# Patient Record
Sex: Female | Born: 1975 | Race: Black or African American | Hispanic: No | Marital: Single | Smoking: Never smoker
Health system: Southern US, Community
[De-identification: ages and names within clinical notes are randomized; demographics above are authoritative.]

## PROBLEM LIST (undated history)

## (undated) DIAGNOSIS — D649 Anemia, unspecified: Secondary | ICD-10-CM

## (undated) DIAGNOSIS — G43909 Migraine, unspecified, not intractable, without status migrainosus: Secondary | ICD-10-CM

## (undated) DIAGNOSIS — G44009 Cluster headache syndrome, unspecified, not intractable: Secondary | ICD-10-CM

## (undated) DIAGNOSIS — D219 Benign neoplasm of connective and other soft tissue, unspecified: Secondary | ICD-10-CM

## (undated) DIAGNOSIS — K219 Gastro-esophageal reflux disease without esophagitis: Secondary | ICD-10-CM

## (undated) HISTORY — DX: Migraine, unspecified, not intractable, without status migrainosus: G43.909

## (undated) HISTORY — PX: WISDOM TOOTH EXTRACTION: SHX21

## (undated) HISTORY — DX: Gastro-esophageal reflux disease without esophagitis: K21.9

---

## 2016-07-06 ENCOUNTER — Emergency Department (HOSPITAL_COMMUNITY): Payer: Self-pay

## 2016-07-06 ENCOUNTER — Encounter (HOSPITAL_COMMUNITY): Payer: Self-pay | Admitting: *Deleted

## 2016-07-06 ENCOUNTER — Emergency Department (HOSPITAL_COMMUNITY)
Admission: EM | Admit: 2016-07-06 | Discharge: 2016-07-06 | Disposition: A | Discharge status: Home / Self Care | Payer: Self-pay | Attending: Physician Assistant | Admitting: Physician Assistant

## 2016-07-06 DIAGNOSIS — K2100 Gastro-esophageal reflux disease with esophagitis, without bleeding: Secondary | ICD-10-CM

## 2016-07-06 DIAGNOSIS — R071 Chest pain on breathing: Secondary | ICD-10-CM | POA: Insufficient documentation

## 2016-07-06 LAB — D-DIMER, QUANTITATIVE (NOT AT ARMC): D DIMER QUANT: 0.65 ug{FEU}/mL — AB (ref 0.00–0.50)

## 2016-07-06 LAB — CBC
HCT: 36.8 % (ref 36.0–46.0)
Hemoglobin: 12 g/dL (ref 12.0–15.0)
MCH: 25.3 pg — ABNORMAL LOW (ref 26.0–34.0)
MCHC: 32.6 g/dL (ref 30.0–36.0)
MCV: 77.5 fL — ABNORMAL LOW (ref 78.0–100.0)
Platelets: 342 10*3/uL (ref 150–400)
RBC: 4.75 MIL/uL (ref 3.87–5.11)
RDW: 14.8 % (ref 11.5–15.5)
WBC: 9.5 10*3/uL (ref 4.0–10.5)

## 2016-07-06 LAB — BASIC METABOLIC PANEL
Anion gap: 8 (ref 5–15)
BUN: 13 mg/dL (ref 6–20)
CALCIUM: 9.1 mg/dL (ref 8.9–10.3)
CO2: 23 mmol/L (ref 22–32)
CREATININE: 0.71 mg/dL (ref 0.44–1.00)
Chloride: 106 mmol/L (ref 101–111)
GFR calc Af Amer: >60 mL/min (ref >60)
GLUCOSE: 82 mg/dL (ref 65–99)
Potassium: 3.9 mmol/L (ref 3.5–5.1)
Sodium: 137 mmol/L (ref 135–145)

## 2016-07-06 LAB — TROPONIN I

## 2016-07-06 LAB — PREGNANCY, URINE: Preg Test, Ur: NEGATIVE

## 2016-07-06 MED ORDER — SUCRALFATE 1 G PO TABS: 1.0000 g | ORAL_TABLET | Freq: Three times a day (TID) | ORAL | 0 refills | Status: DC

## 2016-07-06 MED ORDER — GI COCKTAIL ~~LOC~~
30.0000 mL | Freq: Once | ORAL | Status: AC
  Administered 2016-07-06: 30 mL via ORAL
  Filled 2016-07-06: qty 30

## 2016-07-06 MED ORDER — IOPAMIDOL (ISOVUE-370) INJECTION 76%
INTRAVENOUS | Status: AC
  Administered 2016-07-06: 100 mL
  Filled 2016-07-06: qty 100

## 2016-07-06 MED ORDER — OMEPRAZOLE 20 MG PO CPDR
20.0000 mg | DELAYED_RELEASE_CAPSULE | Freq: Every day | ORAL | 0 refills | Status: DC
Start: 2016-07-06 — End: 2018-04-17

## 2016-07-06 NOTE — ED Triage Notes (Signed)
The pt c/o lt chest pain for 3 days  Hx of acid reflux  lmp   1st april

## 2016-07-06 NOTE — ED Notes (Signed)
Pt c/o of heartburn

## 2016-07-06 NOTE — ED Notes (Signed)
Pt verbalized understanding discharge instructions and denies any further needs or questions at this time. VS stable, ambulatory and steady gait.   

## 2016-07-06 NOTE — ED Provider Notes (Signed)
[facility] DEPT Provider Note   CSN: [id number] Arrival date & time: 07/06/16  1611     History   Chief Complaint Chief Complaint  Patient presents with  . Chest Pain    HPI Shari Berger is a 41 y.o. female.  HPI   Patient is a 41 year old female presenting with pleuritic chest pain. She reports that she started having pain when she takes breaths for the last three days.  Patient reports never having this happen before. She does report very bad reflux been worsening over the last several weeks. She reports she wakes at 3 AM with feelings of reflux, going up into her back of her throat.  No estrogens, no recent travel, no leg swelling. Pt is tachycardic on arrival. Not exertioanl, no HTN, HLD.      History reviewed. No pertinent past medical history.  There are no active problems to display for this patient.   No past surgical history on file.  OB History    No data available       Home Medications    Prior to Admission medications   Medication Sig Start Date End Date Taking? Authorizing Provider  ranitidine (ZANTAC) 75 MG tablet Take 75 mg by mouth 2 (two) times daily as needed for heartburn.   Yes Historical Provider, MD  omeprazole (PRILOSEC) 20 MG capsule Take 1 capsule (20 mg total) by mouth daily. 07/06/16   Westlynn Fifer Lyn Jasa Dundon, MD  sucralfate (CARAFATE) 1 g tablet Take 1 tablet (1 g total) by mouth 4 (four) times daily -  with meals and at bedtime. 07/06/16   Elnor Renovato Lyn Myrl Bynum, MD    Family History No family history on file.  Social History Social History  Substance Use Topics  . Smoking status: Never Smoker  . Smokeless tobacco: Never Used  . Alcohol use No     Allergies   Patient has no known allergies.   Review of Systems Review of Systems  Constitutional: Negative for activity change and fatigue.  HENT: Negative for congestion.   Eyes: Negative for discharge.  Respiratory: Negative for cough, chest tightness and shortness of  breath.   Cardiovascular: Positive for chest pain. Negative for palpitations.  Gastrointestinal: Negative for abdominal distention.  Genitourinary: Negative for difficulty urinating and dysuria.  Musculoskeletal: Negative for joint swelling.  Skin: Negative for rash.  Allergic/Immunologic: Negative for immunocompromised state.  Neurological: Negative for seizures and speech difficulty.  Psychiatric/Behavioral: Negative for agitation and behavioral problems.  All other systems reviewed and are negative.    Physical Exam Updated Vital Signs BP 116/66   Pulse 92   Temp 98.4 F (36.9 C)   Resp 20   Ht 5\' 4"  (1.626 m)   Wt 225 lb (102.1 kg)   LMP 06/26/2016   SpO2 100%   BMI 38.62 kg/m   Physical Exam  Constitutional: She is oriented to person, place, and time. She appears well-developed and well-nourished.  HENT:  Head: Normocephalic and atraumatic.  Eyes: Right eye exhibits no discharge.  Cardiovascular: Normal rate and regular rhythm.   No murmur heard. Pulmonary/Chest: Effort normal. No respiratory distress. She has no wheezes.  Abdominal: Soft. She exhibits no distension.  Musculoskeletal: She exhibits no edema.  Neurological: She is oriented to person, place, and time.  Skin: Skin is warm and dry. She is not diaphoretic.  Psychiatric: She has a normal mood and affect.  Nursing note and vitals reviewed.    ED Treatments / Results  Labs (all labs ordered are  listed, but only abnormal results are displayed) Labs Reviewed  CBC - Abnormal; Notable for the following:       Result Value   MCV 77.5 (*)    MCH 25.3 (*)    All other components within normal limits  D-DIMER, QUANTITATIVE (NOT AT [facility]) - Abnormal; Notable for the following:    D-Dimer, Quant 0.65 (*)    All other components within normal limits  BASIC METABOLIC PANEL  TROPONIN I  PREGNANCY, URINE    EKG  EKG Interpretation  Date/Time:  Wednesday July 06 2016 16:16:55 EDT Ventricular Rate:   95 PR Interval:  142 QRS Duration: 76 QT Interval:  330 QTC Calculation: 414 R Axis:   92 Text Interpretation:  Normal sinus rhythm Normal sinus rhythm Confirmed by Gerald Leitz ([id number]) on 07/06/2016 6:33:51 PM       Radiology Dg Chest 2 View  Result Date: 07/06/2016 CLINICAL DATA:  Mid upper chest pain for 3 days, worse with inspiration EXAM: CHEST  2 VIEW COMPARISON:  None. FINDINGS: No active infiltrate or effusion is seen. Mediastinal and hilar contours are unremarkable. The heart is within normal limits in size. No abnormality is seen. IMPRESSION: No active cardiopulmonary disease. Electronically Signed   By: Ivar Drape M.D.   On: 07/06/2016 17:04   Ct Angio Chest Pe W And/or Wo Contrast  Result Date: 07/06/2016 CLINICAL DATA:  Tachycardia and pleuritic chest pain EXAM: CT ANGIOGRAPHY CHEST WITH CONTRAST TECHNIQUE: Multidetector CT imaging of the chest was performed using the standard protocol during bolus administration of intravenous contrast. Multiplanar CT image reconstructions and MIPs were obtained to evaluate the vascular anatomy. CONTRAST:  63 cc Isovue 370 IV COMPARISON:  CXR 07/06/2016 FINDINGS: Cardiovascular: The study is of quality for the evaluation of pulmonary embolism. There are no filling defects in the central, lobar, segmental or subsegmental pulmonary artery branches to suggest acute pulmonary embolism. Great vessels are normal in course and caliber. Normal heart size. No significant pericardial fluid/thickening. No aortic aneurysm or dissection. Mediastinum/Nodes: No discrete thyroid nodules. Unremarkable esophagus. No pathologically enlarged axillary, mediastinal or hilar lymph nodes. Lungs/Pleura: No pneumothorax. No pleural effusion. Clear lungs bilaterally. Upper abdomen: Small to moderate-sized hiatal hernia. Musculoskeletal:  No aggressive appearing focal osseous lesions. Review of the MIP images confirms the above findings. IMPRESSION: 1. No acute pulmonary  embolus, aortic aneurysm or dissection. 2. Clear lungs. 3. Small to moderate-sized hiatal hernia. Electronically Signed   By: Ashley Royalty M.D.   On: 07/06/2016 22:27    Procedures Procedures (including critical care time)  Medications Ordered in ED Medications  gi cocktail (Maalox,Lidocaine,Donnatal) (30 mLs Oral Given 07/06/16 1849)  iopamidol (ISOVUE-370) 76 % injection (100 mLs  Contrast Given 07/06/16 2214)     Initial Impression / Assessment and Plan / ED Course  I have reviewed the triage vital signs and the nursing notes.  Pertinent labs & imaging results that were available during my care of the patient were reviewed by me and considered in my medical decision making (see chart for details).     Patient is well-appearing 42 year old female with pleuritic chest pain. Do not suspect ACS, no receptors EKG normal and chest pain is only with deep breaths. Given her tachycardia will need to do d-dimer. Patient does not perk out. Patient does have symptoms of reflux. We'll treat her with omeprazole daily and follow up with GI.  11:36 PM D-dimer positive, urine pregnancy completed and then CT angios completed. CT negative. We'll discharge home with  medications as planned.   Final Clinical Impressions(s) / ED Diagnoses   Final diagnoses:  Chest pain on breathing  Gastroesophageal reflux disease with esophagitis    New Prescriptions Discharge Medication List as of 07/06/2016 10:57 PM    START taking these medications   Details  omeprazole (PRILOSEC) 20 MG capsule Take 1 capsule (20 mg total) by mouth daily., Starting Wed 07/06/2016, Print    sucralfate (CARAFATE) 1 g tablet Take 1 tablet (1 g total) by mouth 4 (four) times daily -  with meals and at bedtime., Starting Wed 07/06/2016, Print         Alyce Inscore Julio Alm, MD 07/06/16 [phone]

## 2016-07-06 NOTE — Discharge Instructions (Signed)
We're happy to report that your chest pain is not due to pulmonary embolism and we do not think it's due to heart ischemia. It is possible that this is a result of gastroesophageal reflux. Because of this we will need to start you on a medication that you will take every day. We provided this prescription. This will help reduce the acid in your stomach. In addition to decrease the symptoms, it will promote healing of any ulcers that could be in your stomach creatiing more pain. Additionally we given you medication to help with your symptoms. Please take this as needed. We will you this o take this for 2 weeks and monitor symptoms. If not better, we want you follow-up with a gastroenterologist. The referral has been provided.

## 2016-07-06 NOTE — ED Notes (Signed)
Attempted labs x1

## 2016-07-06 NOTE — ED Notes (Signed)
Pt unable to sign b/c signature pad is not working.

## 2016-07-06 NOTE — ED Triage Notes (Signed)
The pain is worse with inspiration

## 2016-08-05 ENCOUNTER — Encounter (HOSPITAL_COMMUNITY): Payer: Self-pay | Admitting: Emergency Medicine

## 2016-08-05 ENCOUNTER — Emergency Department (HOSPITAL_COMMUNITY)
Admission: EM | Admit: 2016-08-05 | Discharge: 2016-08-05 | Disposition: A | Discharge status: Home / Self Care | Payer: Self-pay | Attending: Emergency Medicine | Admitting: Emergency Medicine

## 2016-08-05 DIAGNOSIS — Z79899 Other long term (current) drug therapy: Secondary | ICD-10-CM | POA: Insufficient documentation

## 2016-08-05 DIAGNOSIS — R1110 Vomiting, unspecified: Secondary | ICD-10-CM | POA: Insufficient documentation

## 2016-08-05 DIAGNOSIS — Z5321 Procedure and treatment not carried out due to patient leaving prior to being seen by health care provider: Secondary | ICD-10-CM | POA: Insufficient documentation

## 2016-08-05 LAB — COMPREHENSIVE METABOLIC PANEL
ALT: 16 U/L (ref 14–54)
ANION GAP: 6 (ref 5–15)
AST: 11 U/L — ABNORMAL LOW (ref 15–41)
Albumin: 3.2 g/dL — ABNORMAL LOW (ref 3.5–5.0)
Alkaline Phosphatase: 59 U/L (ref 38–126)
BILIRUBIN TOTAL: 0.4 mg/dL (ref 0.3–1.2)
BUN: 15 mg/dL (ref 6–20)
CHLORIDE: 110 mmol/L (ref 101–111)
CO2: 19 mmol/L — ABNORMAL LOW (ref 22–32)
Calcium: 8.3 mg/dL — ABNORMAL LOW (ref 8.9–10.3)
Creatinine, Ser: 0.84 mg/dL (ref 0.44–1.00)
Glucose, Bld: 106 mg/dL — ABNORMAL HIGH (ref 65–99)
POTASSIUM: 3.7 mmol/L (ref 3.5–5.1)
Sodium: 135 mmol/L (ref 135–145)
TOTAL PROTEIN: 6.8 g/dL (ref 6.5–8.1)

## 2016-08-05 LAB — CBC
HEMATOCRIT: 35.3 % — AB (ref 36.0–46.0)
Hemoglobin: 11.5 g/dL — ABNORMAL LOW (ref 12.0–15.0)
MCH: 25.2 pg — ABNORMAL LOW (ref 26.0–34.0)
MCHC: 32.6 g/dL (ref 30.0–36.0)
MCV: 77.4 fL — AB (ref 78.0–100.0)
Platelets: 301 10*3/uL (ref 150–400)
RBC: 4.56 MIL/uL (ref 3.87–5.11)
RDW: 15.2 % (ref 11.5–15.5)
WBC: 8.1 10*3/uL (ref 4.0–10.5)

## 2016-08-05 LAB — LIPASE, BLOOD: Lipase: 21 U/L (ref 11–51)

## 2016-08-05 NOTE — ED Notes (Signed)
Pt states, "I have to leave and get my kids. It has nothing to do with the wait." pt encouraged to stay for eval

## 2016-08-05 NOTE — ED Triage Notes (Signed)
Pt reports vomiting for the past 3 days as well as some burning in her throat that she thinks is acid reflux. Reports diarrhea 2 days ago but none since. Denies pain at this time.

## 2017-01-30 ENCOUNTER — Emergency Department (HOSPITAL_COMMUNITY): Payer: Self-pay

## 2017-01-30 ENCOUNTER — Encounter (HOSPITAL_COMMUNITY): Payer: Self-pay

## 2017-01-30 DIAGNOSIS — Z5321 Procedure and treatment not carried out due to patient leaving prior to being seen by health care provider: Secondary | ICD-10-CM | POA: Insufficient documentation

## 2017-01-30 LAB — CBC
HEMATOCRIT: 35.5 % — AB (ref 36.0–46.0)
Hemoglobin: 11.7 g/dL — ABNORMAL LOW (ref 12.0–15.0)
MCH: 25.9 pg — ABNORMAL LOW (ref 26.0–34.0)
MCHC: 33 g/dL (ref 30.0–36.0)
MCV: 78.5 fL (ref 78.0–100.0)
PLATELETS: 333 10*3/uL (ref 150–400)
RBC: 4.52 MIL/uL (ref 3.87–5.11)
RDW: 15.5 % (ref 11.5–15.5)
WBC: 11.5 10*3/uL — AB (ref 4.0–10.5)

## 2017-01-30 LAB — BASIC METABOLIC PANEL
Anion gap: 8 (ref 5–15)
BUN: 16 mg/dL (ref 6–20)
CALCIUM: 8.6 mg/dL — AB (ref 8.9–10.3)
CO2: 22 mmol/L (ref 22–32)
CREATININE: 0.82 mg/dL (ref 0.44–1.00)
Chloride: 105 mmol/L (ref 101–111)
GFR calc Af Amer: >60 mL/min (ref >60)
GLUCOSE: 96 mg/dL (ref 65–99)
Potassium: 3.8 mmol/L (ref 3.5–5.1)
SODIUM: 135 mmol/L (ref 135–145)

## 2017-01-30 LAB — I-STAT TROPONIN, ED: Troponin i, poc: 0 ng/mL (ref 0.00–0.08)

## 2017-01-30 NOTE — ED Triage Notes (Signed)
Pt states that she started to have central CP today after getting some bad news with no other cardiac symptoms noted. Pt also c/o of L knee pain, denies falling or injury.

## 2017-01-31 ENCOUNTER — Emergency Department (HOSPITAL_COMMUNITY)
Admission: EM | Admit: 2017-01-31 | Discharge: 2017-01-31 | Disposition: A | Discharge status: Home / Self Care | Payer: Self-pay | Attending: Emergency Medicine | Admitting: Emergency Medicine

## 2017-01-31 NOTE — ED Notes (Signed)
Called in lobby x 2 no answer.

## 2017-04-11 ENCOUNTER — Encounter: Payer: Self-pay | Admitting: Family Medicine

## 2017-04-11 ENCOUNTER — Ambulatory Visit (INDEPENDENT_AMBULATORY_CARE_PROVIDER_SITE_OTHER): Payer: Self-pay | Admitting: Family Medicine

## 2017-04-11 ENCOUNTER — Other Ambulatory Visit: Payer: Self-pay

## 2017-04-11 VITALS — BP 132/78 | HR 110 | Temp 98.6°F | Ht 65.0 in | Wt 238.0 lb

## 2017-04-11 DIAGNOSIS — G43909 Migraine, unspecified, not intractable, without status migrainosus: Secondary | ICD-10-CM | POA: Insufficient documentation

## 2017-04-11 DIAGNOSIS — R102 Pelvic and perineal pain: Secondary | ICD-10-CM

## 2017-04-11 DIAGNOSIS — K219 Gastro-esophageal reflux disease without esophagitis: Secondary | ICD-10-CM | POA: Insufficient documentation

## 2017-04-11 DIAGNOSIS — Z3169 Encounter for other general counseling and advice on procreation: Secondary | ICD-10-CM

## 2017-04-11 DIAGNOSIS — Z0000 Encounter for general adult medical examination without abnormal findings: Secondary | ICD-10-CM

## 2017-04-11 DIAGNOSIS — M7750 Other enthesopathy of unspecified foot: Secondary | ICD-10-CM

## 2017-04-11 NOTE — Progress Notes (Signed)
ROI for [facility] in [place] was completed and faxed today. Jhan Conery, Salome Spotted, CMA

## 2017-04-11 NOTE — Patient Instructions (Signed)
It was nice meeting you today Ms. White!  If you have any questions or concerns, please feel free to call the clinic.   Be well,  Dr. Shan Levans

## 2017-04-11 NOTE — Progress Notes (Signed)
Subjective:    Shari Berger - 42 y.o. female MRN [id number]  Date of birth: [birth year]  HPI  Shari Berger is here for her new patient visit.  Concerns today include: preconception counseling, pelvic pain, and foot pain.  Preconception counseling: Patient and her partner have been trying to conceive for about 4 months now.  She noticed that her period occurred earlier than normal in December and was much heavier than normal at that time.  She has not taken any pregnancy tests since she has not missed a period.  She has a 60 year old son and her partner has five other children.  A few years ago, she received Clomid to help her conceive, which resulted in pregnancy, but she miscarried.  She denies history of polycystic ovary disease.  Pelvic pain: Patient gets bilateral pelvic pain intermittently.  It is worsened during sex, but does not always occur during sex.  She says that the pain often occurs shortly after her period stops.  This has been going on for the past month.  Foot pain: Patient has had slowly worsening pain on the distal area of the dorsal surface of her right foot.  It started a few months ago and is worst when she wakes up and goes from sitting to standing.  She says she often has to limp for the first few steps before she can walk without pain.  Pain is exacerbated by dorsiflexion and rotating her ankle.  She stands all day during her work at [employer], so she thinks this is the source of her pain.     Health Maintenance:  - last pap smear was in 2016; we will obtain results from previous physician Health Maintenance Due  Topic Date Due  . HIV Screening  01/25/1992  . TETANUS/TDAP  01/25/1996  . PAP SMEAR  01/24/1998    -  reports that  has never smoked. she has never used smokeless tobacco. - Review of Systems: Per HPI. - Past Medical History: Patient Active Problem List   Diagnosis Date Noted  . Tendonitis of foot 04/12/2017  . Encounter for preconception  consultation 04/12/2017  . Pelvic pain 04/12/2017  . Health care maintenance 04/12/2017  . Migraines 04/11/2017  . Acid reflux 04/11/2017   - Medications: reviewed and updated   Objective:   Physical Exam BP 132/78   Pulse (!) 110   Temp 98.6 F (37 C) (Oral)   Ht 5\' 5"  (1.651 m)   Wt 238 lb (108 kg)   LMP 02/27/2017   SpO2 98%   BMI 39.61 kg/m  Gen: NAD, alert, cooperative with exam, well-appearing HEENT: NCAT, clear conjunctiva, supple neck CV: RRR, good S1/S2, no murmur, no edema Resp: CTABL, no wheezes, non-labored Abd: SNTND, BS present, no guarding or organomegaly Musculoskeletal: tenderness to palpation of right metatarsal tendons, especially with dorsiflexion Skin: no rashes, normal turgor  Neuro: no gross deficits.  Psych: good insight, alert and oriented        Assessment & Plan:   Encounter for preconception consultation Patient was told that she may have had a miscarriage in December since she is describing heavy bleeding and an irregular period that month, although this is not a certainty.  Patient counseled on cessation of alcohol consumption and starting prenatal vitamins since she is attempting to conceive.  She was told that she is most fertile around day 14 of her menstrual cycle, so she should have unprotected sexual intercourse most frequently around this date.  She  was also encouraged to get exercise and eat lots of fruits and vegetables since weight loss will aid in conception.  She should try for a few more months, and if she has no success, I will be happy to refer her to a specialist for further management.  Pelvic pain The pain she is describing sounds most like Mittleschmertz, since it seems to occur around ovulation.  She was encouraged to mark the days that she feels this pain to see if there is a pattern.  If her pain worsens, we will initiate further workup.  Tendonitis of foot Patient's soreness in the region of her right metatarsal tendons  as well as her job that requires frequent standing makes tendonitis the most likely diagnosis.  Her current shoes that she wears for work have worked well for her, but they are very old and likely not supportive.  She was encouraged to rest her feet as much as possible and to buy new shoes that will be more supportive for her.  I will refer her to sports medicine in the future if these interventions are not effective.  Health care maintenance Patient may be due for a pap smear if she did not get HPV cotesting in 2016 or if that pap was abnormal.  We will obtain patient's records and decide the course from there.    Maia Breslow, M.D. 04/12/2017, 2:10 PM PGY-1, [facility]

## 2017-04-12 DIAGNOSIS — M7750 Other enthesopathy of unspecified foot: Secondary | ICD-10-CM | POA: Insufficient documentation

## 2017-04-12 DIAGNOSIS — R102 Pelvic and perineal pain: Secondary | ICD-10-CM | POA: Insufficient documentation

## 2017-04-12 DIAGNOSIS — Z0000 Encounter for general adult medical examination without abnormal findings: Secondary | ICD-10-CM | POA: Insufficient documentation

## 2017-04-12 DIAGNOSIS — Z3169 Encounter for other general counseling and advice on procreation: Secondary | ICD-10-CM | POA: Insufficient documentation

## 2017-04-12 NOTE — Assessment & Plan Note (Signed)
Patient's soreness in the region of her right metatarsal tendons as well as her job that requires frequent standing makes tendonitis the most likely diagnosis.  Her current shoes that she wears for work have worked well for her, but they are very old and likely not supportive.  She was encouraged to rest her feet as much as possible and to buy new shoes that will be more supportive for her.  I will refer her to sports medicine in the future if these interventions are not effective.

## 2017-04-12 NOTE — Assessment & Plan Note (Addendum)
Patient was told that she may have had a miscarriage in December since she is describing heavy bleeding and an irregular period that month, although this is not a certainty.  Patient counseled on cessation of alcohol consumption and starting prenatal vitamins since she is attempting to conceive.  She was told that she is most fertile around day 14 of her menstrual cycle, so she should have unprotected sexual intercourse most frequently around this date.  She was also encouraged to get exercise and eat lots of fruits and vegetables since weight loss will aid in conception.  She should try for a few more months, and if she has no success, I will be happy to refer her to a specialist for further management.

## 2017-04-12 NOTE — Assessment & Plan Note (Signed)
Patient may be due for a pap smear if she did not get HPV cotesting in 2016 or if that pap was abnormal.  We will obtain patient's records and decide the course from there.

## 2017-04-12 NOTE — Assessment & Plan Note (Signed)
The pain she is describing sounds most like [place]chmertz, since it seems to occur around ovulation.  She was encouraged to mark the days that she feels this pain to see if there is a pattern.  If her pain worsens, we will initiate further workup.

## 2017-05-21 ENCOUNTER — Encounter: Payer: Self-pay | Admitting: Family Medicine

## 2017-05-21 ENCOUNTER — Other Ambulatory Visit: Payer: Self-pay | Admitting: Family Medicine

## 2017-05-21 NOTE — Telephone Encounter (Signed)
Patient's last pap smear with reflex HPV was December 2015.  Pap normal, HPV negative.  Due for next pap with HPV testing in December 2020.

## 2017-06-29 ENCOUNTER — Ambulatory Visit: Payer: Self-pay | Admitting: Family Medicine

## 2017-09-07 ENCOUNTER — Other Ambulatory Visit: Payer: Self-pay

## 2017-09-07 ENCOUNTER — Emergency Department (HOSPITAL_COMMUNITY): Payer: Self-pay

## 2017-09-07 ENCOUNTER — Emergency Department (HOSPITAL_COMMUNITY)
Admission: EM | Admit: 2017-09-07 | Discharge: 2017-09-07 | Disposition: A | Discharge status: Home / Self Care | Payer: Self-pay | Attending: Emergency Medicine | Admitting: Emergency Medicine

## 2017-09-07 ENCOUNTER — Encounter (HOSPITAL_COMMUNITY): Payer: Self-pay | Admitting: Emergency Medicine

## 2017-09-07 DIAGNOSIS — R079 Chest pain, unspecified: Secondary | ICD-10-CM | POA: Insufficient documentation

## 2017-09-07 DIAGNOSIS — Z5321 Procedure and treatment not carried out due to patient leaving prior to being seen by health care provider: Secondary | ICD-10-CM | POA: Insufficient documentation

## 2017-09-07 LAB — BASIC METABOLIC PANEL
Anion gap: 7 (ref 5–15)
BUN: 15 mg/dL (ref 6–20)
CALCIUM: 8.4 mg/dL — AB (ref 8.9–10.3)
CO2: 22 mmol/L (ref 22–32)
CREATININE: 0.66 mg/dL (ref 0.44–1.00)
Chloride: 110 mmol/L (ref 101–111)
GFR calc Af Amer: >60 mL/min (ref >60)
Glucose, Bld: 90 mg/dL (ref 65–99)
POTASSIUM: 4 mmol/L (ref 3.5–5.1)
SODIUM: 139 mmol/L (ref 135–145)

## 2017-09-07 LAB — I-STAT TROPONIN, ED
TROPONIN I, POC: 0 ng/mL (ref 0.00–0.08)
Troponin i, poc: 0.02 ng/mL (ref 0.00–0.08)

## 2017-09-07 LAB — CBC
HCT: 36.6 % (ref 36.0–46.0)
Hemoglobin: 11 g/dL — ABNORMAL LOW (ref 12.0–15.0)
MCH: 23.4 pg — ABNORMAL LOW (ref 26.0–34.0)
MCHC: 30.1 g/dL (ref 30.0–36.0)
MCV: 77.9 fL — ABNORMAL LOW (ref 78.0–100.0)
Platelets: 321 10*3/uL (ref 150–400)
RBC: 4.7 MIL/uL (ref 3.87–5.11)
RDW: 15.9 % — AB (ref 11.5–15.5)
WBC: 8.5 10*3/uL (ref 4.0–10.5)

## 2017-09-07 LAB — I-STAT BETA HCG BLOOD, ED (MC, WL, AP ONLY)

## 2017-09-07 NOTE — ED Notes (Signed)
Patient called x 3 for room with no response.  All areas of lobby and outside checked.  Patient not found.

## 2017-09-07 NOTE — ED Triage Notes (Signed)
Pt reports left sided chest pain x3 days, no sob, nausea or radiation of pain. Also endorses right foot pain x1 month, denies any known injuries.

## 2017-09-07 NOTE — ED Notes (Signed)
Spoke with a provider regarding patient and additional orders. Notified patient verbalized understanding.

## 2017-09-11 NOTE — ED Notes (Addendum)
Follow up call made  No answer 09/11/17  1434  s Bekim Werntz rn     1442  Pt returned follow up call  s Enrika Aguado rn

## 2017-11-27 ENCOUNTER — Other Ambulatory Visit: Payer: Self-pay

## 2017-11-27 ENCOUNTER — Encounter (HOSPITAL_COMMUNITY): Payer: Self-pay | Admitting: Emergency Medicine

## 2017-11-27 ENCOUNTER — Emergency Department (HOSPITAL_COMMUNITY)
Admission: EM | Admit: 2017-11-27 | Discharge: 2017-11-27 | Disposition: A | Discharge status: Home / Self Care | Payer: Self-pay | Attending: Emergency Medicine | Admitting: Emergency Medicine

## 2017-11-27 ENCOUNTER — Emergency Department (HOSPITAL_COMMUNITY): Payer: Self-pay

## 2017-11-27 DIAGNOSIS — Z79899 Other long term (current) drug therapy: Secondary | ICD-10-CM | POA: Insufficient documentation

## 2017-11-27 DIAGNOSIS — M722 Plantar fascial fibromatosis: Secondary | ICD-10-CM

## 2017-11-27 MED ORDER — MELOXICAM 15 MG PO TABS: 15.0000 mg | ORAL_TABLET | Freq: Every day | ORAL | 0 refills | Status: DC

## 2017-11-27 NOTE — ED Provider Notes (Signed)
[facility] EMERGENCY DEPARTMENT Provider Note   CSN: [id number] Arrival date & time: 11/27/17  1819     History   Chief Complaint Chief Complaint  Patient presents with  . Foot Pain    HPI Shari Berger is a 42 y.o. female resents the emergency department chief complaint of foot pain.  Patient states he had onset of pain about a week ago.  She has pain in her heel and on the top of her right foot.  It is described as worse with ambulation especially with the first step after rest.,  Aching, at times throbbing and sharp.  It does not radiate up her leg.  She denies numbness or tingling.  She denies any known injuries.  She tried to ibuprofen without significant relief of her symptoms  HPI  Past Medical History:  Diagnosis Date  . Acid reflux   . Migraines     Patient Active Problem List   Diagnosis Date Noted  . Tendonitis of foot 04/12/2017  . Encounter for preconception consultation 04/12/2017  . Pelvic pain 04/12/2017  . Health care maintenance 04/12/2017  . Migraines 04/11/2017  . Acid reflux 04/11/2017    History reviewed. No pertinent surgical history.   OB History    Gravida  2   Para  1   Term  1   Preterm  0   AB  1   Living  1     SAB      TAB      Ectopic  1   Multiple      Live Births  1            Home Medications    Prior to Admission medications   Medication Sig Start Date End Date Taking? Authorizing Provider  omeprazole (PRILOSEC) 20 MG capsule Take 1 capsule (20 mg total) by mouth daily. 07/06/16   Mackuen, Courteney Lyn, MD  ranitidine (ZANTAC) 75 MG tablet Take 75 mg by mouth 2 (two) times daily as needed for heartburn.    [provider]  sucralfate (CARAFATE) 1 g tablet Take 1 tablet (1 g total) by mouth 4 (four) times daily -  with meals and at bedtime. 07/06/16   Mackuen, Fredia Sorrow, MD    Family History Family History  Problem Relation Age of Onset  . Depression Mother   . Heart  disease Father   . Diabetes Father   . Hypertension Father     Social History Social History   Tobacco Use  . Smoking status: Never Smoker  . Smokeless tobacco: Never Used  Substance Use Topics  . Alcohol use: Yes    Alcohol/week: 10.0 standard drinks    Types: 10 Glasses of wine per week  . Drug use: No     Allergies   Patient has no known allergies.   Review of Systems Review of Systems  Ten systems reviewed and are negative for acute change, except as noted in the HPI.   Physical Exam Updated Vital Signs BP 136/77   Pulse 97   Temp 98 F (36.7 C) (Oral)   Resp 17   LMP 11/17/2017 (Exact Date)   SpO2 100%   Physical Exam  Constitutional: She is oriented to person, place, and time. She appears well-developed and well-nourished. No distress.  HENT:  Head: Normocephalic and atraumatic.  Eyes: Conjunctivae are normal. No scleral icterus.  Neck: Normal range of motion.  Cardiovascular: Normal rate, regular rhythm and normal heart sounds. Exam reveals  no gallop and no friction rub.  No murmur heard. Pulmonary/Chest: Effort normal and breath sounds normal. No respiratory distress.  Abdominal: Soft. Bowel sounds are normal. She exhibits no distension and no mass. There is no tenderness. There is no guarding.  Musculoskeletal:  Pain along the midfoot in the extensor tendons of the right foot.  Warmth.  Pain with active dorsiflexion and eversion.  Patient also has pain in the bottom of the heel .   Neurological: She is alert and oriented to person, place, and time.  Skin: Skin is warm and dry. She is not diaphoretic.  Psychiatric: Her behavior is normal.  Nursing note and vitals reviewed.    ED Treatments / Results  Labs (all labs ordered are listed, but only abnormal results are displayed) Labs Reviewed - No data to display  EKG None  Radiology Dg Ankle Complete Right  Result Date: 11/27/2017 CLINICAL DATA:  Injury to the right ankle EXAM: RIGHT ANKLE -  COMPLETE 3+ VIEW COMPARISON:  None. FINDINGS: Moderate plantar calcaneal spur. No acute displaced fracture or malalignment. The ankle mortise is symmetric. IMPRESSION: No acute osseous abnormality. Electronically Signed   By: Donavan Foil M.D.   On: 11/27/2017 19:25   Dg Foot Complete Right  Result Date: 11/27/2017 CLINICAL DATA:  Right foot injury and pain.  Bruise on top of foot EXAM: RIGHT FOOT COMPLETE - 3+ VIEW COMPARISON:  None. FINDINGS: There is no evidence of fracture or dislocation. There is no evidence of arthropathy or other focal bone abnormality. Soft tissues are unremarkable. Plantar heel spur IMPRESSION: No acute finding. Electronically Signed   By: Monte Fantasia M.D.   On: 11/27/2017 19:30    Procedures Procedures (including critical care time)  Medications Ordered in ED Medications - No data to display   Initial Impression / Assessment and Plan / ED Course  I have reviewed the triage vital signs and the nursing notes.  Pertinent labs & imaging results that were available during my care of the patient were reviewed by me and considered in my medical decision making (see chart for details).     It appears to have plantar fasciitis.  Think secondarily to the plantar fasciitis she likely also has tendinitis of the tendon on the dorsal surface of the foot secondary to holding the foot abnormally with walking.  Patient placed in cam walker given crutches, anti-inflammatories and ice.  I discussed follow-up with podiatry and return precautions.  She appears otherwise appropriate for discharge at this time.  Final Clinical Impressions(s) / ED Diagnoses   Final diagnoses:  None    ED Discharge Orders    None       Margarita Mail, PA-C 11/27/17 2101    Sherwood Gambler, MD 11/28/17 [phone]

## 2017-11-27 NOTE — Discharge Instructions (Signed)
Contact a health care provider if: Your symptoms do not go away after treatment with home care measures. Your pain gets worse. Your pain affects your ability to move or do your daily activities.

## 2017-11-27 NOTE — ED Notes (Signed)
Patient verbalizes understanding of discharge instructions. Opportunity for questioning and answers were provided. Armband removed by staff, pt discharged from ED ambulatory.   

## 2017-11-27 NOTE — ED Triage Notes (Signed)
Pt stats that last week she hurt her right foot/ankle while at work and the pain has not gotten any better. Reports doing epsom salt soaks and taking advil without relief.

## 2017-11-27 NOTE — ED Notes (Signed)
Cam walker applied Crutch training complete

## 2018-04-17 ENCOUNTER — Emergency Department (HOSPITAL_COMMUNITY)
Admission: EM | Admit: 2018-04-17 | Discharge: 2018-04-17 | Disposition: A | Discharge status: Home / Self Care | Payer: Medicaid Other | Attending: Emergency Medicine | Admitting: Emergency Medicine

## 2018-04-17 ENCOUNTER — Encounter (HOSPITAL_COMMUNITY): Payer: Self-pay | Admitting: Emergency Medicine

## 2018-04-17 ENCOUNTER — Other Ambulatory Visit: Payer: Self-pay

## 2018-04-17 ENCOUNTER — Emergency Department (HOSPITAL_COMMUNITY): Payer: Medicaid Other

## 2018-04-17 DIAGNOSIS — Z79899 Other long term (current) drug therapy: Secondary | ICD-10-CM | POA: Insufficient documentation

## 2018-04-17 DIAGNOSIS — K219 Gastro-esophageal reflux disease without esophagitis: Secondary | ICD-10-CM | POA: Insufficient documentation

## 2018-04-17 DIAGNOSIS — R071 Chest pain on breathing: Secondary | ICD-10-CM

## 2018-04-17 LAB — BASIC METABOLIC PANEL
Anion gap: 8 (ref 5–15)
BUN: 12 mg/dL (ref 6–20)
CALCIUM: 8.5 mg/dL — AB (ref 8.9–10.3)
CO2: 21 mmol/L — ABNORMAL LOW (ref 22–32)
Chloride: 109 mmol/L (ref 98–111)
Creatinine, Ser: 0.75 mg/dL (ref 0.44–1.00)
GFR calc Af Amer: >60 mL/min (ref >60)
Glucose, Bld: 87 mg/dL (ref 70–99)
Potassium: 3.6 mmol/L (ref 3.5–5.1)
Sodium: 138 mmol/L (ref 135–145)

## 2018-04-17 LAB — I-STAT BETA HCG BLOOD, ED (MC, WL, AP ONLY): I-stat hCG, quantitative: <5 m[IU]/mL (ref <5)

## 2018-04-17 LAB — CBC
HCT: 36.6 % (ref 36.0–46.0)
Hemoglobin: 11.3 g/dL — ABNORMAL LOW (ref 12.0–15.0)
MCH: 24 pg — ABNORMAL LOW (ref 26.0–34.0)
MCHC: 30.9 g/dL (ref 30.0–36.0)
MCV: 77.7 fL — ABNORMAL LOW (ref 80.0–100.0)
Platelets: 352 10*3/uL (ref 150–400)
RBC: 4.71 MIL/uL (ref 3.87–5.11)
RDW: 15.7 % — AB (ref 11.5–15.5)
WBC: 8.7 10*3/uL (ref 4.0–10.5)
nRBC: 0 % (ref 0.0–0.2)

## 2018-04-17 LAB — I-STAT TROPONIN, ED: Troponin i, poc: 0 ng/mL (ref 0.00–0.08)

## 2018-04-17 MED ORDER — SODIUM CHLORIDE 0.9% FLUSH: 3.0000 mL | Freq: Once | INTRAVENOUS | Status: DC

## 2018-04-17 MED ORDER — OMEPRAZOLE 20 MG PO CPDR: 20.0000 mg | DELAYED_RELEASE_CAPSULE | Freq: Every day | ORAL | 0 refills | Status: DC

## 2018-04-17 NOTE — ED Provider Notes (Signed)
[facility] EMERGENCY DEPARTMENT Provider Note   CSN: [id number] Arrival date & time: 04/17/18  1455     History   Chief Complaint Chief Complaint  Patient presents with  . Chest Pain    HPI Shari Berger is a 43 y.o. female with past medical history of GERD, presenting to the emergency department with complaint of chest pain that began yesterday, worsening today around noon.  She states pain is sharp, left chest, worse with breathing.  She states today it worsened at noon, she felt shortness of breath at the time, however her chest pain and shortness of breath have completely resolved.  No associated nausea, diaphoresis, or radiation of pain.  She states she took aspirin with EMS, and that is the last time she had symptoms.  No history of DVT/PE, cancer, or cardiac history.  No exogenous estrogen use.  No leg pain or swelling, hemoptysis, or URI symptoms.  States she is having flare of her GERD, she was taking Zantac, however stopped with the recall.  She is adjusted her diet, and takes Tums as needed.\ Per chart review, patient was evaluated with similar complaint on 07/06/2016.  D-dimer was done due to tachycardia, followed by CTA of the chest which was negative.  This was felt to be related to GERD and patient was discharged with Prilosec.  She states that medication has helped her symptoms, however she has not been taking it recently.  She is followed by her primary care provider who treats her GERD.  The history is provided by the patient and medical records.    Past Medical History:  Diagnosis Date  . Acid reflux   . Migraines     Patient Active Problem List   Diagnosis Date Noted  . Tendonitis of foot 04/12/2017  . Encounter for preconception consultation 04/12/2017  . Pelvic pain 04/12/2017  . Health care maintenance 04/12/2017  . Migraines 04/11/2017  . Acid reflux 04/11/2017    History reviewed. No pertinent surgical history.   OB History    Gravida  2   Para  1   Term  1   Preterm  0   AB  1   Living  1     SAB      TAB      Ectopic  1   Multiple      Live Births  1            Home Medications    Prior to Admission medications   Medication Sig Start Date End Date Taking? Authorizing Provider  meloxicam (MOBIC) 15 MG tablet Take 1 tablet (15 mg total) by mouth daily. Take 1 daily with food. 11/27/17   Margarita Mail, PA-C  omeprazole (PRILOSEC) 20 MG capsule Take 1 capsule (20 mg total) by mouth daily. 04/17/18   Robinson, [place] N, PA-C  ranitidine (ZANTAC) 75 MG tablet Take 75 mg by mouth 2 (two) times daily as needed for heartburn.    [provider]  sucralfate (CARAFATE) 1 g tablet Take 1 tablet (1 g total) by mouth 4 (four) times daily -  with meals and at bedtime. 07/06/16   Mackuen, Fredia Sorrow, MD    Family History Family History  Problem Relation Age of Onset  . Depression Mother   . Heart disease Father   . Diabetes Father   . Hypertension Father     Social History Social History   Tobacco Use  . Smoking status: Never Smoker  . Smokeless  tobacco: Never Used  Substance Use Topics  . Alcohol use: Yes    Alcohol/week: 10.0 standard drinks    Types: 10 Glasses of wine per week  . Drug use: No     Allergies   Patient has no known allergies.   Review of Systems Review of Systems  Constitutional: Negative for diaphoresis.  Respiratory: Positive for shortness of breath (Resolved).   Cardiovascular: Positive for chest pain (Resolved). Negative for palpitations.  Gastrointestinal: Negative for nausea.  All other systems reviewed and are negative.    Physical Exam Updated Vital Signs BP 102/67   Pulse 84   Resp (!) 26   Ht 5\' 5"  (1.651 m)   Wt 99.8 kg   SpO2 100%   BMI 36.61 kg/m   Physical Exam Vitals signs and nursing note reviewed.  Constitutional:      General: She is not in acute distress.    Appearance: She is well-developed. She is not ill-appearing  or diaphoretic.  HENT:     Head: Normocephalic and atraumatic.     Mouth/Throat:     Mouth: Mucous membranes are moist.  Eyes:     Conjunctiva/sclera: Conjunctivae normal.  Neck:     Musculoskeletal: Normal range of motion and neck supple.  Cardiovascular:     Rate and Rhythm: Normal rate and regular rhythm.     Heart sounds: Normal heart sounds.  Pulmonary:     Effort: Pulmonary effort is normal.     Breath sounds: Normal breath sounds.  Chest:     Chest wall: Tenderness (mild tenderness to palpation over sternum. no crepitus) present.  Abdominal:     General: Bowel sounds are normal.     Palpations: Abdomen is soft.     Tenderness: There is no abdominal tenderness. There is no guarding or rebound.  Musculoskeletal:        General: No tenderness.     Right lower leg: Edema (pretibial) present.     Left lower leg: Edema (pretibial) present.     Comments: No redness to LE.  Skin:    General: Skin is warm.  Neurological:     Mental Status: She is alert.  Psychiatric:        Behavior: Behavior normal.      ED Treatments / Results  Labs (all labs ordered are listed, but only abnormal results are displayed) Labs Reviewed  BASIC METABOLIC PANEL - Abnormal; Notable for the following components:      Result Value   CO2 21 (*)    Calcium 8.5 (*)    All other components within normal limits  CBC - Abnormal; Notable for the following components:   Hemoglobin 11.3 (*)    MCV 77.7 (*)    MCH 24.0 (*)    RDW 15.7 (*)    All other components within normal limits  I-STAT TROPONIN, ED  I-STAT BETA HCG BLOOD, ED ([facility], [facility], [facility] ONLY)    EKG EKG Interpretation  Date/Time:  Tuesday April 17 2018 18:22:19 EST Ventricular Rate:  80 PR Interval:    QRS Duration: 86 QT Interval:  360 QTC Calculation: 416 R Axis:   90 Text Interpretation:  Sinus rhythm Borderline right axis deviation Low voltage, precordial leads Confirmed by Milton Ferguson [id number]) on 04/17/2018 6:32:22  PM   Radiology Dg Chest 2 View  Result Date: 04/17/2018 CLINICAL DATA:  Acute chest tightness today. EXAM: CHEST - 2 VIEW COMPARISON:  09/07/2017 and prior exams FINDINGS: The cardiomediastinal silhouette is unremarkable. There  is no evidence of focal airspace disease, pulmonary edema, suspicious pulmonary nodule/mass, pleural effusion, or pneumothorax. No acute bony abnormalities are identified. IMPRESSION: No active cardiopulmonary disease. Electronically Signed   By: Margarette Canada M.D.   On: 04/17/2018 16:10    Procedures Procedures (including critical care time)  Medications Ordered in ED Medications  sodium chloride flush (NS) 0.9 % injection 3 mL (has no administration in time range)     Initial Impression / Assessment and Plan / ED Course  I have reviewed the triage vital signs and the nursing notes.  Pertinent labs & imaging results that were available during my care of the patient were reviewed by me and considered in my medical decision making (see chart for details).  Clinical Course as of Apr 17 1909  Tue Apr 17, 2018  1810 Perc neg.    [JR]    Clinical Course User Index [JR] Robinson, [place] N, PA-C    Patient presenting with sharp chest pain worse with breathing.  Symptoms resolved prior to arrival to the emergency department.  No risk factors for PE, PERC negative.  No cardiac history.  Chest pain is reproducible on exam.  Heart and lung sounds are normal.  Work-up is overall reassuring.  Negative troponin.  Normal chest x-ray.  EKG normal sinus rhythm.  Remainder of labs are reassuring.  Normal vital signs.  Low suspicion for pulmonary or cardiac etiology of symptoms.  Patient does have history of GERD, and states she has been having a flare of this.  Only treating with Tums since Zantac recall.  Will prescribe omeprazole and encourage PCP follow-up.  Strict return precautions discussed.  Patient agreeable to plan, well-appearing, nondistressed, safer  discharge.  Discussed results, findings, treatment and follow up. Patient advised of return precautions. Patient verbalized understanding and agreed with plan.  Final Clinical Impressions(s) / ED Diagnoses   Final diagnoses:  Gastroesophageal reflux disease, esophagitis presence not specified  Chest pain on breathing    ED Discharge Orders         Ordered    omeprazole (PRILOSEC) 20 MG capsule  Daily     04/17/18 1910           Robinson, [place] N, PA-C 04/17/18 1911    Milton Ferguson, MD 04/17/18 2234

## 2018-04-17 NOTE — ED Triage Notes (Signed)
Pt reports onset of chest tightness and shakiness that started earlier today, pt given ASA by EMS, denies CP at this time.

## 2018-04-17 NOTE — Discharge Instructions (Signed)
Please read instructions below. Return to the ER for new or worsening symptoms; including worsening chest pain, shortness of breath, pain that radiates to the arm or neck, pain or shortness of breath worsened with exertion. Follow up with your primary care provider.  Take omeprazole daily for your acid reflux.

## 2018-12-03 ENCOUNTER — Emergency Department (HOSPITAL_COMMUNITY)
Admission: EM | Admit: 2018-12-03 | Discharge: 2018-12-03 | Disposition: A | Discharge status: Home / Self Care | Payer: Medicaid Other | Attending: Emergency Medicine | Admitting: Emergency Medicine

## 2018-12-03 ENCOUNTER — Other Ambulatory Visit: Payer: Self-pay

## 2018-12-03 DIAGNOSIS — Z79899 Other long term (current) drug therapy: Secondary | ICD-10-CM | POA: Insufficient documentation

## 2018-12-03 DIAGNOSIS — N939 Abnormal uterine and vaginal bleeding, unspecified: Secondary | ICD-10-CM | POA: Insufficient documentation

## 2018-12-03 DIAGNOSIS — R102 Pelvic and perineal pain: Secondary | ICD-10-CM | POA: Insufficient documentation

## 2018-12-03 LAB — CBC WITH DIFFERENTIAL/PLATELET
Abs Immature Granulocytes: 0.02 10*3/uL (ref 0.00–0.07)
Basophils Absolute: 0 10*3/uL (ref 0.0–0.1)
Basophils Relative: 1 %
Eosinophils Absolute: 0.4 10*3/uL (ref 0.0–0.5)
Eosinophils Relative: 6 %
HCT: 36.1 % (ref 36.0–46.0)
Hemoglobin: 10.8 g/dL — ABNORMAL LOW (ref 12.0–15.0)
Immature Granulocytes: 0 %
Lymphocytes Relative: 39 %
Lymphs Abs: 2.7 10*3/uL (ref 0.7–4.0)
MCH: 23.3 pg — ABNORMAL LOW (ref 26.0–34.0)
MCHC: 29.9 g/dL — ABNORMAL LOW (ref 30.0–36.0)
MCV: 78 fL — ABNORMAL LOW (ref 80.0–100.0)
Monocytes Absolute: 0.5 10*3/uL (ref 0.1–1.0)
Monocytes Relative: 7 %
Neutro Abs: 3.3 10*3/uL (ref 1.7–7.7)
Neutrophils Relative %: 47 %
Platelets: 392 10*3/uL (ref 150–400)
RBC: 4.63 MIL/uL (ref 3.87–5.11)
RDW: 16.2 % — ABNORMAL HIGH (ref 11.5–15.5)
WBC: 7 10*3/uL (ref 4.0–10.5)
nRBC: 0 % (ref 0.0–0.2)

## 2018-12-03 LAB — I-STAT BETA HCG BLOOD, ED (MC, WL, AP ONLY): I-stat hCG, quantitative: <5 m[IU]/mL (ref <5)

## 2018-12-03 LAB — BASIC METABOLIC PANEL
Anion gap: 7 (ref 5–15)
BUN: 14 mg/dL (ref 6–20)
CO2: 22 mmol/L (ref 22–32)
Calcium: 8.4 mg/dL — ABNORMAL LOW (ref 8.9–10.3)
Chloride: 109 mmol/L (ref 98–111)
Creatinine, Ser: 0.69 mg/dL (ref 0.44–1.00)
GFR calc Af Amer: >60 mL/min (ref >60)
GFR calc non Af Amer: >60 mL/min (ref >60)
Glucose, Bld: 95 mg/dL (ref 70–99)
Potassium: 3.9 mmol/L (ref 3.5–5.1)
Sodium: 138 mmol/L (ref 135–145)

## 2018-12-03 MED ORDER — MEDROXYPROGESTERONE ACETATE 5 MG PO TABS: 5.0000 mg | ORAL_TABLET | Freq: Every day | ORAL | 0 refills | Status: DC

## 2018-12-03 MED ORDER — IBUPROFEN 400 MG PO TABS
600.0000 mg | ORAL_TABLET | Freq: Once | ORAL | Status: AC
  Administered 2018-12-03: 600 mg via ORAL
  Filled 2018-12-03: qty 1

## 2018-12-03 NOTE — ED Notes (Signed)
Patient Alert and oriented to baseline. Stable and ambulatory to baseline. Patient verbalized understanding of the discharge instructions.  Patient belongings were taken by the patient.   

## 2018-12-03 NOTE — ED Provider Notes (Signed)
[facility] EMERGENCY DEPARTMENT Provider Note   CSN: [id number] Arrival date & time: 12/03/18  0944     History   Chief Complaint Chief Complaint  Patient presents with  . Vaginal Bleeding    HPI Shari Berger is a 43 y.o. female with history of migraines, acid reflux otherwise healthy presents today for vaginal bleeding x3 days.  Patient reports that she has menstrual cycles regularly every month, last menstrual cycle approximately 2 weeks ago.  Patient reports that she developed pelvic cramping and bleeding consistent with her menstrual.  3 days ago which have been constant since onset, this concerned her so she came to the emergency department today.  She describes a mild cramping sensation intermittent without aggravating factors improved with ibuprofen and associated with vaginal bleeding.  Patient denies fever/chills, headache, chest pain/shortness of breath, lightheadedness, nausea/vomiting, diarrhea, dysuria/hematuria, vaginal discharge, concern for sexually transmitted diseases or any additional concerns.    HPI  Past Medical History:  Diagnosis Date  . Acid reflux   . Migraines     Patient Active Problem List   Diagnosis Date Noted  . Tendonitis of foot 04/12/2017  . Encounter for preconception consultation 04/12/2017  . Pelvic pain 04/12/2017  . Health care maintenance 04/12/2017  . Migraines 04/11/2017  . Acid reflux 04/11/2017    No past surgical history on file.   OB History    Gravida  2   Para  1   Term  1   Preterm  0   AB  1   Living  1     SAB      TAB      Ectopic  1   Multiple      Live Births  1            Home Medications    Prior to Admission medications   Medication Sig Start Date End Date Taking? Authorizing Provider  medroxyPROGESTERone (PROVERA) 5 MG tablet Take 1 tablet (5 mg total) by mouth daily for 5 days. 12/03/18 12/08/18  Nuala Alpha A, PA-C  meloxicam (MOBIC) 15 MG tablet Take 1 tablet  (15 mg total) by mouth daily. Take 1 daily with food. 11/27/17   Margarita Mail, PA-C  omeprazole (PRILOSEC) 20 MG capsule Take 1 capsule (20 mg total) by mouth daily. 04/17/18   Robinson, [place] N, PA-C  ranitidine (ZANTAC) 75 MG tablet Take 75 mg by mouth 2 (two) times daily as needed for heartburn.    [provider]  sucralfate (CARAFATE) 1 g tablet Take 1 tablet (1 g total) by mouth 4 (four) times daily -  with meals and at bedtime. 07/06/16   Mackuen, Fredia Sorrow, MD    Family History Family History  Problem Relation Age of Onset  . Depression Mother   . Heart disease Father   . Diabetes Father   . Hypertension Father     Social History Social History   Tobacco Use  . Smoking status: Never Smoker  . Smokeless tobacco: Never Used  Substance Use Topics  . Alcohol use: Yes    Alcohol/week: 10.0 standard drinks    Types: 10 Glasses of wine per week  . Drug use: No     Allergies   Patient has no known allergies.   Review of Systems Review of Systems Ten systems are reviewed and are negative for acute change except as noted in the HPI   Physical Exam Updated Vital Signs BP 137/90 (BP Location: Right Arm)  Pulse 93   Temp 98.1 F (36.7 C) (Oral)   Resp 20   LMP 12/03/2018 (Exact Date)   SpO2 99%   Physical Exam Constitutional:      General: She is not in acute distress.    Appearance: Normal appearance. She is well-developed. She is obese. She is not ill-appearing or diaphoretic.  HENT:     Head: Normocephalic and atraumatic.     Right Ear: External ear normal.     Left Ear: External ear normal.     Nose: Nose normal.  Eyes:     General: Vision grossly intact. Gaze aligned appropriately.     Pupils: Pupils are equal, round, and reactive to light.  Neck:     Musculoskeletal: Normal range of motion.     Trachea: Trachea and phonation normal. No tracheal deviation.  Pulmonary:     Effort: Pulmonary effort is normal. No respiratory distress.   Abdominal:     General: There is no distension.     Palpations: Abdomen is soft.     Tenderness: There is no abdominal tenderness. There is no guarding or rebound.  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  Pelvic exam: normal external genitalia without evidence of trauma. VULVA: normal appearing vulva with no masses, tenderness or lesion. VAGINA: normal appearing vagina with normal color and discharge, no lesions. CERVIX: normal appearing cervix without lesions, cervical motion tenderness absent, cervical loss with scant bleeding present, no sign of discharge.  Blood clots present within vaginal vault. No uterine or adnexal tenderness on exam, exam somewhat limited by body habitus.  Of note patient refused specimen collection today. Musculoskeletal: Normal range of motion.  Skin:    General: Skin is warm and dry.  Neurological:     Mental Status: She is alert.     GCS: GCS eye subscore is 4. GCS verbal subscore is 5. GCS motor subscore is 6.     Comments: Speech is clear and goal oriented, follows commands Major Cranial nerves without deficit, no facial droop Moves extremities without ataxia, coordination intact  Psychiatric:        Behavior: Behavior normal.    ED Treatments / Results  Labs (all labs ordered are listed, but only abnormal results are displayed) Labs Reviewed  CBC WITH DIFFERENTIAL/PLATELET - Abnormal; Notable for the following components:      Result Value   Hemoglobin 10.8 (*)    MCV 78.0 (*)    MCH 23.3 (*)    MCHC 29.9 (*)    RDW 16.2 (*)    All other components within normal limits  BASIC METABOLIC PANEL - Abnormal; Notable for the following components:   Calcium 8.4 (*)    All other components within normal limits  I-STAT BETA HCG BLOOD, ED ([facility], [facility], [facility] ONLY)    EKG None  Radiology No results found.  Procedures Procedures (including critical care time)  Medications Ordered in ED Medications  ibuprofen (ADVIL)  tablet 600 mg (has no administration in time range)     Initial Impression / Assessment and Plan / ED Course  I have reviewed the triage vital signs and the nursing notes.  Pertinent labs & imaging results that were available during my care of the patient were reviewed by me and considered in my medical decision making (see chart for details).    Beta-hCG negative BMP nonacute CBC with hemoglobin 10.8, appears near baseline from previous of 11.3.  Patient without signs of symptomatic anemia today. -  Physical examination with bleeding and blood clots present in the vaginal vault consistent with menstrual cycle.  No tenderness on examination.  No signs of discharge or foreign body.  Patient refused specimen collection today and denies concern for STD.  Appears as patient with abnormal uterine bleeding, no concerning factors today necessitating further evaluation in the ED today.  Discussed case with Dr. Theora Gianotti, plan of care at this time is Provera x5 days and OB/GYN follow-up.  At this time there does not appear to be any evidence of an acute emergency medical condition and the patient appears stable for discharge with appropriate outpatient follow up. Diagnosis was discussed with patient who verbalizes understanding of care plan and is agreeable to discharge. I have discussed return precautions with patient who verbalizes understanding of return precautions. Patient encouraged to follow-up with their PCP and OBGYN. All questions answered.  Patient's case discussed with Dr. Maryan Rued who agrees with plan to discharge with Provera and outpatient follow-up.   Note: Portions of this report may have been transcribed using voice recognition software. Every effort was made to ensure accuracy; however, inadvertent computerized transcription errors may still be present. Final Clinical Impressions(s) / ED Diagnoses   Final diagnoses:  Abnormal uterine bleeding (AUB)    ED Discharge Orders          Ordered    medroxyPROGESTERone (PROVERA) 5 MG tablet  Daily     12/03/18 1150           Gari Crown 12/03/18 1152    Blanchie Dessert, MD 12/03/18 2114

## 2018-12-03 NOTE — Discharge Instructions (Addendum)
You have been diagnosed today with abnormal uterine bleeding.  At this time there does not appear to be the presence of an emergent medical condition, however there is always the potential for conditions to change. Please read and follow the below instructions.  Please return to the Emergency Department immediately for any new or worsening symptoms. Please be sure to follow up with your Primary Care Provider within one week regarding your visit today; please call their office to schedule an appointment even if you are feeling better for a follow-up visit. Please take the medication Provera as prescribed for your symptoms.  Please call the OB/GYN office on your discharge paperwork today to schedule follow-up appointment for further evaluation and establishment of care.  Get help right away if: You pass out. You have to change pads every hour. You have belly (abdominal) pain. You have a fever. You get sweaty. You get weak. You passing large blood clots from your vagina. You have any new/concerning or worsening symptoms.  Please read the additional information packets attached to your discharge summary.  Do not take your medicine if  develop an itchy rash, swelling in your mouth or lips, or difficulty breathing; call 911 and seek immediate emergency medical attention if this occurs.

## 2018-12-03 NOTE — ED Triage Notes (Signed)
Pt endorses vaginal bleeding x 3 days, had already had her menstrual cycle earlier this month and then began bleeding again.

## 2018-12-26 ENCOUNTER — Encounter (INDEPENDENT_AMBULATORY_CARE_PROVIDER_SITE_OTHER): Payer: Self-pay | Admitting: Primary Care

## 2018-12-26 ENCOUNTER — Other Ambulatory Visit: Payer: Self-pay

## 2018-12-26 ENCOUNTER — Ambulatory Visit (INDEPENDENT_AMBULATORY_CARE_PROVIDER_SITE_OTHER): Payer: Self-pay | Admitting: Primary Care

## 2018-12-26 DIAGNOSIS — Z7689 Persons encountering health services in other specified circumstances: Secondary | ICD-10-CM

## 2018-12-26 DIAGNOSIS — Z09 Encounter for follow-up examination after completed treatment for conditions other than malignant neoplasm: Secondary | ICD-10-CM

## 2018-12-26 DIAGNOSIS — M25561 Pain in right knee: Secondary | ICD-10-CM

## 2018-12-26 DIAGNOSIS — M79661 Pain in right lower leg: Secondary | ICD-10-CM

## 2018-12-26 DIAGNOSIS — K219 Gastro-esophageal reflux disease without esophagitis: Secondary | ICD-10-CM

## 2018-12-26 DIAGNOSIS — N921 Excessive and frequent menstruation with irregular cycle: Secondary | ICD-10-CM

## 2018-12-26 DIAGNOSIS — M79662 Pain in left lower leg: Secondary | ICD-10-CM

## 2018-12-26 MED ORDER — OMEPRAZOLE 20 MG PO CPDR: 20.0000 mg | DELAYED_RELEASE_CAPSULE | Freq: Every day | ORAL | 3 refills | Status: DC

## 2018-12-26 MED ORDER — IBUPROFEN 800 MG PO TABS: 800.0000 mg | ORAL_TABLET | Freq: Three times a day (TID) | ORAL | 1 refills | Status: DC | PRN

## 2018-12-26 NOTE — Progress Notes (Signed)
Pt is having issues with acid reflux  Pt states that for the last two week she has numbness from buttocks radiating down legs.  Pt complains of some back pain

## 2018-12-26 NOTE — Progress Notes (Signed)
Virtual Visit via Telephone Note  I connected with Shari Berger on 12/26/18 at  2:30 PM EDT by telephone and verified that I am speaking with the correct person using two identifiers.   I discussed the limitations, risks, security and privacy concerns of performing an evaluation and management service by telephone and the availability of in person appointments. I also discussed with the patient that there may be a patient responsible charge related to this service. The patient expressed understanding and agreed to proceed.   History of Present Illness: Shari Berger is having a tel visit to establish care previously followed by Dr. Shan Levans. She presented to the emergency room on 12/03/2018 for abnormal uterine bleed and treated with provera. Patient concern that she having heavy bleeding again changing her pads every 2-3 hours. Patient is also concern with leg pain that travels down she admits her job has her on her feet all day.    Past Medical History:  Diagnosis Date  . Acid reflux   . Migraines    Observations/Objective: Review of Systems  Cardiovascular: Positive for leg swelling.       Swelling  Gastrointestinal: Positive for abdominal pain.  Musculoskeletal: Positive for myalgias.       Leg pain  All other systems reviewed and are negative.  Assessment and Plan: Shari Berger was seen today for hospitalization follow-up and referral.  Diagnoses and all orders for this visit:  Menorrhagia with irregular cycle -     ibuprofen (ADVIL) 800 MG tablet; Take 1 tablet (800 mg total) by mouth every 8 (eight) hours as needed.  Encounter to establish care Juluis Mire, NP-C will be your  (PCP) that will  provides both the first contact for a person with an undiagnosed health concern as well as continuing care of varied medical conditions, not limited by cause, organ system, or diagnosis.   Arthralgia of both lower legs . May alternate with heat and ice application for pain relief. May  also alternate with acetaminophen and Ibuprofen as prescribed pain relief. Other alternatives include massage, acupuncture and water aerobics.  You must stay active and avoid a sedentary lifestyle.  Hospital discharge follow-up Abnormal uterine bleed treated and emergency. Bleeding has return refer to GYN  Gastroesophageal reflux disease, esophagitis presence not specified Discussed eating small frequent meal, reduction in acidic foods, fried foods ,spicy foods, alcohol caffeine and tobacco and certain medications. Avoid laying down after eating 35mins-1hour, elevated head of the bed.  Other orders -     omeprazole (PRILOSEC) 20 MG capsule; Take 1 capsule (20 mg total) by mouth daily.    Follow Up Instructions:    I discussed the assessment and treatment plan with the patient. The patient was provided an opportunity to ask questions and all were answered. The patient agreed with the plan and demonstrated an understanding of the instructions.   The patient was advised to call back or seek an in-person evaluation if the symptoms worsen or if the condition fails to improve as anticipated  I provided 22 minutes of non-face-to-face time during this enounter.   Kerin Perna, NP

## 2019-02-01 ENCOUNTER — Ambulatory Visit (INDEPENDENT_AMBULATORY_CARE_PROVIDER_SITE_OTHER): Payer: Self-pay | Admitting: Obstetrics & Gynecology

## 2019-02-01 ENCOUNTER — Encounter: Payer: Self-pay | Admitting: Obstetrics & Gynecology

## 2019-02-01 ENCOUNTER — Other Ambulatory Visit: Payer: Self-pay

## 2019-02-01 VITALS — BP 124/82 | HR 98 | Wt 230.0 lb

## 2019-02-01 DIAGNOSIS — N939 Abnormal uterine and vaginal bleeding, unspecified: Secondary | ICD-10-CM | POA: Insufficient documentation

## 2019-02-01 DIAGNOSIS — D219 Benign neoplasm of connective and other soft tissue, unspecified: Secondary | ICD-10-CM

## 2019-02-01 MED ORDER — MEGESTROL ACETATE 40 MG PO TABS: 40.0000 mg | ORAL_TABLET | Freq: Every day | ORAL | 5 refills | Status: DC

## 2019-02-01 NOTE — Progress Notes (Signed)
GYNECOLOGY OFFICE VISIT NOTE  History:   Shari Berger is a 43 y.o. G2P1011 here today for evaluation of heavy abnormal uterine bleeding that occurs about twice a month for the last six months. Prior to this, she had regular cycles but always had heavy periods associated with cramping.  Was evaluated in the ED in 11/2018, Hgb 10.2, was placed on a course of Provera 5 mg daily for a few days that did not work.  She has never been diagnosed with fibroids or other GYN conditions, but reports that her sisters all had heavy bleeding after age 48 that resulted in getting procedures. Reports assocaited abdominal cramping and pain during intercourse.  She denies any current abnormal vaginal discharge, vaginal irritation,  bleeding, pelvic pain or other concerns.    Past Medical History:  Diagnosis Date  . Acid reflux   . Migraines     History reviewed. No pertinent surgical history.  The following portions of the patient's history were reviewed and updated as appropriate: allergies, current medications, past family history, past medical history, past social history, past surgical history and problem list.   Health Maintenance:  Normal paps many years ago. Never had a mammogram.   Review of Systems:  Pertinent items noted in HPI and remainder of comprehensive ROS otherwise negative.  Physical Exam:  BP 124/82   Pulse 98   Wt 230 lb (104.3 kg)   BMI 38.27 kg/m  CONSTITUTIONAL: Well-developed, well-nourished female in no acute distress.  HEENT:  Normocephalic, atraumatic. External right and left ear normal. No scleral icterus.  NECK: Normal range of motion, supple, no masses noted on observation SKIN: No rash noted. Not diaphoretic. No erythema. No pallor. MUSCULOSKELETAL: Normal range of motion. No edema noted. NEUROLOGIC: Alert and oriented to person, place, and time. Normal muscle tone coordination. No cranial nerve deficit noted. PSYCHIATRIC: Normal mood and affect. Normal behavior. Normal  judgment and thought content. CARDIOVASCULAR: Normal heart rate noted RESPIRATORY: Effort and breath sounds normal, no problems with respiration noted ABDOMEN: No masses noted. Soft.  No other overt distention noted.   PELVIC: Normal appearing external genitalia; normal appearing vaginal mucosa and cervix.  No abnormal discharge noted.  Enlargel uterine size, large posterior mass (?fibroid) palpated, occupies entire posterior cul-de-sac. Tender to touch. Unable to do full uterine exam or palpate other masses or adnexa due to discomfort and habitus.  Labs and Imaging CBC Latest Ref Rng & Units 12/03/2018 04/17/2018 09/07/2017  WBC 4.0 - 10.5 K/uL 7.0 8.7 8.5  Hemoglobin 12.0 - 15.0 g/dL 10.8(L) 11.3(L) 11.0(L)  Hematocrit 36.0 - 46.0 % 36.1 36.6 36.6  Platelets 150 - 400 K/uL 392 352 321      Assessment and Plan:    1. Abnormal uterine bleeding (AUB) 2. Fibroids Fibroid/uterine mass palpated on exam. This definitely is causing her dyspareunia, and can contribute to the her AUB.  Discussed other evaluation needed: labs, pap, endometrial biopsy and pelvic ultrasound. She declines these for now due to cost. Was referred to [facility] for [facility] and mammogram though.  She will get charity care application and reports she call for the rest of the evaluation. Pelvic ultrasound ordered, will be scheduled as per patient's request later. - [place] [facility] Pelvis Complete with Transvaginal; Future - megestrol (MEGACE) 40 MG tablet; Take 1 tablet (40 mg total) by mouth daily. Can increase to two tablets daily in the event of heavy bleeding  Dispense: 60 tablet; Refill: 5 Routine preventative health maintenance measures emphasized. Please refer to  After Visit Summary for other counseling recommendations.   Return for any gynecologic concerns.    Total face-to-face time with patient: 20 minutes.  Over 50% of encounter was spent on counseling and coordination of care.   Verita Schneiders, MD, [place] for [employer], [place]

## 2019-02-01 NOTE — Patient Instructions (Signed)
Abnormal Uterine Bleeding Abnormal uterine bleeding is unusual bleeding from the uterus. It includes:  Bleeding or spotting between periods.  Bleeding after sex.  Bleeding that is heavier than normal.  Periods that last longer than usual.  Bleeding after menopause. Abnormal uterine bleeding can affect women at various stages in life, including teenagers, women in their reproductive years, pregnant women, and women who have reached menopause. Common causes of abnormal uterine bleeding include:  Pregnancy.  Growths of tissue (polyps).  A noncancerous tumor in the uterus (fibroid).  Infection.  Cancer.  Hormonal imbalances. Any type of abnormal bleeding should be evaluated by a health care provider. Many cases are minor and simple to treat, while others are more serious. Treatment will depend on the cause of the bleeding. Follow these instructions at home:  Monitor your condition for any changes.  Do not use tampons, douche, or have sex if told by your health care provider.  Change your pads often.  Get regular exams that include pelvic exams and cervical cancer screening.  Keep all follow-up visits as told by your health care provider. This is important. Contact a health care provider if:  Your bleeding lasts for more than one week.  You feel dizzy at times.  You feel nauseous or you vomit. Get help right away if:  You pass out.  Your bleeding soaks through a pad every hour.  You have abdominal pain.  You have a fever.  You become sweaty or weak.  You pass large blood clots from your vagina. Summary  Abnormal uterine bleeding is unusual bleeding from the uterus.  Any type of abnormal bleeding should be evaluated by a health care provider. Many cases are minor and simple to treat, while others are more serious.  Treatment will depend on the cause of the bleeding. This information is not intended to replace advice given to you by your health care provider.  Make sure you discuss any questions you have with your health care provider. Document Released: 03/14/2005 Document Revised: 06/21/2017 Document Reviewed: 04/15/2016 Elsevier Patient Education  2020 Elsevier Inc.  

## 2019-02-11 ENCOUNTER — Other Ambulatory Visit (HOSPITAL_COMMUNITY): Payer: Self-pay | Admitting: *Deleted

## 2019-02-11 DIAGNOSIS — Z1231 Encounter for screening mammogram for malignant neoplasm of breast: Secondary | ICD-10-CM

## 2019-02-19 ENCOUNTER — Other Ambulatory Visit: Payer: Self-pay

## 2019-02-19 ENCOUNTER — Ambulatory Visit (HOSPITAL_COMMUNITY)
Admission: RE | Admit: 2019-02-19 | Discharge: 2019-02-19 | Disposition: A | Discharge status: Home / Self Care | Payer: Self-pay | Source: Ambulatory Visit | Attending: Obstetrics & Gynecology | Admitting: Obstetrics & Gynecology

## 2019-02-19 DIAGNOSIS — N939 Abnormal uterine and vaginal bleeding, unspecified: Secondary | ICD-10-CM | POA: Insufficient documentation

## 2019-04-10 ENCOUNTER — Telehealth: Payer: Self-pay | Admitting: Obstetrics & Gynecology

## 2019-04-10 NOTE — Telephone Encounter (Signed)
Patient requesting a call back with her ultrasound results.

## 2019-04-12 NOTE — Telephone Encounter (Signed)
I called Shari Berger back. She states no one ever called her about the results. I informed her US showed multiple fibroids. She and I discussed Dr. Harolyn Rutherford had felt fibroids during her exam. I asked how her bleeding is.She states it is the same.  I asked if she is taking the megace, She states she didn't know about it. I informed her Dr.Anyanwu discussed with her at her appointment and sent in the prescription that day. I advised her to call pharmacy and tell them she wants to get that prescription because they will have to pull up order and get it ready. I also asked her to let us know if she has problems getting it filled. I informed her no treatment for fibroids unless they cause her a lot of pain or heavy bleeding that medicine doesn't help. She voices understanding . She and I discussed I will forward to [facility] to make sure there are no other recommendations.

## 2019-04-16 ENCOUNTER — Other Ambulatory Visit: Payer: Self-pay

## 2019-04-16 ENCOUNTER — Ambulatory Visit (HOSPITAL_COMMUNITY)
Admission: RE | Admit: 2019-04-16 | Discharge: 2019-04-16 | Disposition: A | Discharge status: Home / Self Care | Payer: Medicaid Other | Source: Ambulatory Visit | Attending: Obstetrics and Gynecology | Admitting: Obstetrics and Gynecology

## 2019-04-16 ENCOUNTER — Ambulatory Visit: Payer: Medicaid Other

## 2019-04-16 ENCOUNTER — Encounter (HOSPITAL_COMMUNITY): Payer: Self-pay

## 2019-04-16 ENCOUNTER — Other Ambulatory Visit (HOSPITAL_COMMUNITY): Payer: Self-pay

## 2019-04-16 DIAGNOSIS — Z1239 Encounter for other screening for malignant neoplasm of breast: Secondary | ICD-10-CM | POA: Insufficient documentation

## 2019-04-16 DIAGNOSIS — Z01419 Encounter for gynecological examination (general) (routine) without abnormal findings: Secondary | ICD-10-CM | POA: Insufficient documentation

## 2019-04-16 HISTORY — DX: Benign neoplasm of connective and other soft tissue, unspecified: D21.9

## 2019-04-16 NOTE — Patient Instructions (Addendum)
Explained breast self awareness with [place]. Let patient know that her next Pap smear will be due in one year due to patient stated her last Pap smear was abnormal with no follow-up. Referred patient to the [facility] for a screening mammogram. Appointment scheduled for Tuesday, April 16, 2019 at 1030. Patient aware of appointment and will be there. Let patient know will follow up with her within the next couple weeks with results of her Pap smear by letter or phone. Informed patient that the [facility] will follow-up with her within the next couple of weeks with results of her mammogram by letter or phone. Shari Berger verbalized understanding.  Whitman Meinhardt, Arvil [place], RN 11:44 AM

## 2019-04-16 NOTE — Progress Notes (Signed)
No complaints today.   Pap Smear: Pap smear completed today. Last Pap smear was in 2017 in [facility] and abnormal per patient. Per patient had no follow-up completed and last Pap smear is the only abnormal Pap smear she has had. No Pap smear results are in Epic.  Physical exam: Breasts Breasts symmetrical. No skin abnormalities bilateral breasts. No nipple retraction bilateral breasts. No nipple discharge bilateral breasts. No lymphadenopathy. No lumps palpated bilateral breasts. No complaints of pain or tenderness on exam. Referred patient to the [place] for a screening mammogram. Appointment scheduled for Tuesday, April 16, 2019 at 1030.        Pelvic/Bimanual   Ext Genitalia No lesions, no swelling and no discharge observed on external genitalia.         Vagina Vagina pink and normal texture. No lesions or discharge observed in vagina.          Cervix Cervix is present. Cervix pink and of normal texture. No discharge observed.     Uterus Uterus is present and palpable. Uterus in normal position and normal size.        Adnexae Bilateral ovaries present and palpable. No tenderness on palpation.         Rectovaginal No rectal exam completed today since patient had no rectal complaints. No skin abnormalities observed on exam.    Smoking History: Patient has never smoked.  Patient Navigation: Patient education provided. Access to services provided for patient through [facility] program.   Breast and Cervical Cancer Risk Assessment: Patient has no family history of breast cancer, known genetic mutations, or radiation treatment to the chest before age 28. Patient has no history of cervical dysplasia, immunocompromised, or DES exposure in-utero.  Risk Assessment    Risk Scores      04/16/2019   Last edited by: Demetrius Revel, LPN   5-year risk: 0.7 %   Lifetime risk: 9.5 %

## 2019-04-17 LAB — CYTOLOGY - PAP
Adequacy: ABSENT
Comment: NEGATIVE
Diagnosis: NEGATIVE
High risk HPV: NEGATIVE

## 2019-04-19 ENCOUNTER — Encounter (INDEPENDENT_AMBULATORY_CARE_PROVIDER_SITE_OTHER): Payer: Self-pay | Admitting: Primary Care

## 2019-04-19 ENCOUNTER — Ambulatory Visit (INDEPENDENT_AMBULATORY_CARE_PROVIDER_SITE_OTHER): Payer: Medicaid Other | Admitting: Primary Care

## 2019-04-19 ENCOUNTER — Other Ambulatory Visit: Payer: Self-pay

## 2019-04-19 VITALS — BP 129/80 | HR 90 | Temp 97.5°F | Wt 229.6 lb

## 2019-04-19 DIAGNOSIS — K219 Gastro-esophageal reflux disease without esophagitis: Secondary | ICD-10-CM

## 2019-04-19 DIAGNOSIS — D500 Iron deficiency anemia secondary to blood loss (chronic): Secondary | ICD-10-CM

## 2019-04-19 DIAGNOSIS — M549 Dorsalgia, unspecified: Secondary | ICD-10-CM

## 2019-04-19 DIAGNOSIS — Z23 Encounter for immunization: Secondary | ICD-10-CM

## 2019-04-19 MED ORDER — OMEPRAZOLE 20 MG PO CPDR: 20.0000 mg | DELAYED_RELEASE_CAPSULE | Freq: Every day | ORAL | 3 refills | Status: DC

## 2019-04-19 NOTE — Progress Notes (Signed)
Established Patient Office Visit  Subjective:  Patient ID: Shari Berger, female    DOB: Apr 12, 1975  Age: 44 y.o. MRN: [id number]  CC:  Chief Complaint  Patient presents with  . Gastroesophageal Reflux  . Leg Pain    left leg    HPI Shari Berger presents for presents for left leg pain starts from back and migrates to her thigh -shooting pain that makes it difficult to sit , pain is relieved when weight is off her left leg  Past Medical History:  Diagnosis Date  . Acid reflux   . Fibroid   . Migraines     History reviewed. No pertinent surgical history.  Family History  Problem Relation Age of Onset  . Depression Mother   . Heart disease Father   . Diabetes Father   . Hypertension Father     Social History   Socioeconomic History  . Marital status: Single    Spouse name: Not on file  . Number of children: Not on file  . Years of education: Not on file  . Highest education level: 12th grade  Occupational History  . Not on file  Tobacco Use  . Smoking status: Never Smoker  . Smokeless tobacco: Never Used  Substance and Sexual Activity  . Alcohol use: Yes    Alcohol/week: 10.0 standard drinks    Types: 10 Glasses of wine per week  . Drug use: No  . Sexual activity: Yes    Partners: Male    Birth control/protection: None  Other Topics Concern  . Not on file  Social History Narrative  . Not on file   Social Determinants of Health   Financial Resource Strain:   . Difficulty of Paying Living Expenses: Not on file  Food Insecurity:   . Worried About Charity fundraiser in the Last Year: Not on file  . Ran Out of Food in the Last Year: Not on file  Transportation Needs: No Transportation Needs  . Lack of Transportation (Medical): No  . Lack of Transportation (Non-Medical): No  Physical Activity:   . Days of Exercise per Week: Not on file  . Minutes of Exercise per Session: Not on file  Stress:   . Feeling of Stress : Not on file  Social Connections:    . Frequency of Communication with Friends and Family: Not on file  . Frequency of Social Gatherings with Friends and Family: Not on file  . Attends Religious Services: Not on file  . Active Member of Clubs or Organizations: Not on file  . Attends Archivist Meetings: Not on file  . Marital Status: Not on file  Intimate Partner Violence:   . Fear of Current or Ex-Partner: Not on file  . Emotionally Abused: Not on file  . Physically Abused: Not on file  . Sexually Abused: Not on file    Outpatient Medications Prior to Visit  Medication Sig Dispense Refill  . ibuprofen (ADVIL) 800 MG tablet Take 1 tablet (800 mg total) by mouth every 8 (eight) hours as needed. 90 tablet 1  . medroxyPROGESTERone (PROVERA) 5 MG tablet Take 1 tablet (5 mg total) by mouth daily for 5 days. 5 tablet 0  . megestrol (MEGACE) 40 MG tablet Take 1 tablet (40 mg total) by mouth daily. Can increase to two tablets daily in the event of heavy bleeding (Patient not taking: Reported on 04/16/2019) 60 tablet 5  . omeprazole (PRILOSEC) 20 MG capsule Take 1 capsule (20 mg total)  by mouth daily. (Patient not taking: Reported on 02/01/2019) 30 capsule 3  . ranitidine (ZANTAC) 75 MG tablet Take 75 mg by mouth 2 (two) times daily as needed for heartburn.     No facility-administered medications prior to visit.    No Known Allergies  ROS Review of Systems  Gastrointestinal: Positive for abdominal distention.       Multiple fibroids causing lower abdomen pain  Musculoskeletal: Positive for back pain.  Neurological: Positive for headaches.  All other systems reviewed and are negative.     Objective:    Physical Exam  Constitutional: She is oriented to person, place, and time. She appears well-developed and well-nourished.  obesed  HENT:  Head: Normocephalic.  Cardiovascular: Normal rate and regular rhythm.  Pulmonary/Chest: Effort normal and breath sounds normal.  Abdominal: Soft. Bowel sounds are normal.   Musculoskeletal:        General: Tenderness present.     Cervical back: Normal range of motion and neck supple.     Comments: Raised leg pain started at 30 degrees  Neurological: She is oriented to person, place, and time. She has normal reflexes.  Skin: Skin is dry. There is erythema.  Psychiatric: She has a normal mood and affect.    BP 129/80 (BP Location: Right Arm, Patient Position: Sitting, Cuff Size: Large)   Pulse 90   Temp (!) 97.5 F (36.4 C) (Temporal)   Wt 229 lb 9.6 oz (104.1 kg)   LMP 04/03/2019 (Exact Date)   SpO2 96%   BMI 38.21 kg/m  Wt Readings from Last 3 Encounters:  04/19/19 229 lb 9.6 oz (104.1 kg)  04/16/19 233 lb (105.7 kg)  02/01/19 230 lb (104.3 kg)     Health Maintenance Due  Topic Date Due  . HIV Screening  01/25/1992    There are no preventive care reminders to display for this patient.  No results found for: TSH Lab Results  Component Value Date   WBC 7.0 12/03/2018   HGB 10.8 (L) 12/03/2018   HCT 36.1 12/03/2018   MCV 78.0 (L) 12/03/2018   PLT 392 12/03/2018   Lab Results  Component Value Date   NA 138 12/03/2018   K 3.9 12/03/2018   CO2 22 12/03/2018   GLUCOSE 95 12/03/2018   BUN 14 12/03/2018   CREATININE 0.69 12/03/2018   BILITOT 0.4 08/05/2016   ALKPHOS 59 08/05/2016   AST 11 (L) 08/05/2016   ALT 16 08/05/2016   PROT 6.8 08/05/2016   ALBUMIN 3.2 (L) 08/05/2016   CALCIUM 8.4 (L) 12/03/2018   ANIONGAP 7 12/03/2018   No results found for: CHOL No results found for: HDL No results found for: LDLCALC No results found for: TRIG No results found for: CHOLHDL No results found for: HGBA1C    Assessment & Plan:   Shari Berger was seen today for gastroesophageal reflux and leg pain.  Diagnoses and all orders for this visit:  Need for Tdap vaccination Tdap is recommended every 10 years for adults weekly or primary she gets tetanus..  At least 1 of those doses should be with Tdap in adults age 65 and older who have previously  received Tdap.  Recommend by the CDC. -     Tdap vaccine greater than or equal to 7yo IM  Acute left-sided back pain, unspecified back location BACK PAIN  Location: left side  Quality shooting stabbing pain Onset: October 2020 Worse with: sitting    Better with: weight off leg  Radiation: to thigh Trauma:  no Best sitting/standing/leaning forward: worst  Red Flags Fecal/urinary incontinence: no Numbness/Weakness: no Fever/chills/sweats: no Night pain: yes Unexplained weight loss: no No relief with bedrest: if not lying on left side some relief  h/o cancer/immunosuppression: no   Gastroesophageal reflux disease without esophagitis Discussed eating small frequent meal, reduction in acidic foods, fried foods ,spicy foods, alcohol caffeine and tobacco and certain medications. Avoid laying down after eating 67mins-1hour, elevated head of the bed. Discontinue Zantac no longer available   Iron deficiency anemia due to chronic blood loss Iron rich foods such as shellfish,liver, organ meats(liver, gizzard), and red meats can increase cholesterol and should be consumed in moderation.However; legumes(beans), spinach, pumpkin seeds, [place], broccoli, tofu, green leafy vegetables and dark chocolate can be consumed without concern to cholesterol.  History of AUB seen by GYN    Meds ordered this encounter  Medications  . omeprazole (PRILOSEC) 20 MG capsule    Sig: Take 1 capsule (20 mg total) by mouth daily.    Dispense:  30 capsule    Refill:  3    Follow-up: Return in about 1 week (around 04/26/2019) for ear irragtion .    Kerin Perna, NP

## 2019-04-19 NOTE — Patient Instructions (Signed)
Acute Back Pain, Adult Acute back pain is sudden and usually short-lived. It is often caused by an injury to the muscles and tissues in the back. The injury may result from:  A muscle or ligament getting overstretched or torn (strained). Ligaments are tissues that connect bones to each other. Lifting something improperly can cause a back strain.  Wear and tear (degeneration) of the spinal disks. Spinal disks are circular tissue that provides cushioning between the bones of the spine (vertebrae).  Twisting motions, such as while playing sports or doing yard work.  A hit to the back.  Arthritis. You may have a physical exam, lab tests, and imaging tests to find the cause of your pain. Acute back pain usually goes away with rest and home care. Follow these instructions at home: Managing pain, stiffness, and swelling  Take over-the-counter and prescription medicines only as told by your health care provider.  Your health care provider may recommend applying ice during the first 24-48 hours after your pain starts. To do this: ? Put ice in a plastic bag. ? Place a towel between your skin and the bag. ? Leave the ice on for 20 minutes, 2-3 times a day.  If directed, apply heat to the affected area as often as told by your health care provider. Use the heat source that your health care provider recommends, such as a moist heat pack or a heating pad. ? Place a towel between your skin and the heat source. ? Leave the heat on for 20-30 minutes. ? Remove the heat if your skin turns bright red. This is especially important if you are unable to feel pain, heat, or cold. You have a greater risk of getting burned. Activity   Do not stay in bed. Staying in bed for more than 1-2 days can delay your recovery.  Sit up and stand up straight. Avoid leaning forward when you sit, or hunching over when you stand. ? If you work at a desk, sit close to it so you do not need to lean over. Keep your chin tucked  in. Keep your neck drawn back, and keep your elbows bent at a right angle. Your arms should look like the letter "L." ? Sit high and close to the steering wheel when you drive. Add lower back (lumbar) support to your car seat, if needed.  Take short walks on even surfaces as soon as you are able. Try to increase the length of time you walk each day.  Do not sit, drive, or stand in one place for more than 30 minutes at a time. Sitting or standing for long periods of time can put stress on your back.  Do not drive or use heavy machinery while taking prescription pain medicine.  Use proper lifting techniques. When you bend and lift, use positions that put less stress on your back: ? Bend your knees. ? Keep the load close to your body. ? Avoid twisting.  Exercise regularly as told by your health care provider. Exercising helps your back heal faster and helps prevent back injuries by keeping muscles strong and flexible.  Work with a physical therapist to make a safe exercise program, as recommended by your health care provider. Do any exercises as told by your physical therapist. Lifestyle  Maintain a healthy weight. Extra weight puts stress on your back and makes it difficult to have good posture.  Avoid activities or situations that make you feel anxious or stressed. Stress and anxiety increase muscle   tension and can make back pain worse. Learn ways to manage anxiety and stress, such as through exercise. General instructions  Sleep on a firm mattress in a comfortable position. Try lying on your side with your knees slightly bent. If you lie on your back, put a pillow under your knees.  Follow your treatment plan as told by your health care provider. This may include: ? Cognitive or behavioral therapy. ? Acupuncture or massage therapy. ? Meditation or yoga. Contact a health care provider if:  You have pain that is not relieved with rest or medicine.  You have increasing pain going down  into your legs or buttocks.  Your pain does not improve after 2 weeks.  You have pain at night.  You lose weight without trying.  You have a fever or chills. Get help right away if:  You develop new bowel or bladder control problems.  You have unusual weakness or numbness in your arms or legs.  You develop nausea or vomiting.  You develop abdominal pain.  You feel faint. Summary  Acute back pain is sudden and usually short-lived.  Use proper lifting techniques. When you bend and lift, use positions that put less stress on your back.  Take over-the-counter and prescription medicines and apply heat or ice as directed by your health care provider. This information is not intended to replace advice given to you by your health care provider. Make sure you discuss any questions you have with your health care provider. Document Revised: 07/03/2018 Document Reviewed: 10/26/2016 Elsevier Patient Education  2020 Elsevier Inc.  

## 2019-04-19 NOTE — Progress Notes (Signed)
97.5 229.6  Pain in back of left leg that shoots up to hip- sitting for long periods of time increase pain

## 2019-04-22 ENCOUNTER — Ambulatory Visit: Payer: Medicaid Other | Admitting: Family Medicine

## 2019-04-23 ENCOUNTER — Other Ambulatory Visit: Payer: Self-pay

## 2019-04-23 ENCOUNTER — Ambulatory Visit
Admission: RE | Admit: 2019-04-23 | Discharge: 2019-04-23 | Disposition: A | Discharge status: Home / Self Care | Payer: No Typology Code available for payment source | Source: Ambulatory Visit | Attending: Obstetrics and Gynecology | Admitting: Obstetrics and Gynecology

## 2019-04-23 DIAGNOSIS — Z1231 Encounter for screening mammogram for malignant neoplasm of breast: Secondary | ICD-10-CM

## 2019-04-24 ENCOUNTER — Other Ambulatory Visit: Payer: Self-pay | Admitting: Obstetrics and Gynecology

## 2019-04-24 DIAGNOSIS — R928 Other abnormal and inconclusive findings on diagnostic imaging of breast: Secondary | ICD-10-CM

## 2019-04-30 ENCOUNTER — Other Ambulatory Visit: Payer: Self-pay | Admitting: Obstetrics and Gynecology

## 2019-04-30 ENCOUNTER — Other Ambulatory Visit: Payer: Self-pay

## 2019-04-30 ENCOUNTER — Ambulatory Visit
Admission: RE | Admit: 2019-04-30 | Discharge: 2019-04-30 | Disposition: A | Discharge status: Home / Self Care | Payer: No Typology Code available for payment source | Source: Ambulatory Visit | Attending: Obstetrics and Gynecology | Admitting: Obstetrics and Gynecology

## 2019-04-30 DIAGNOSIS — R928 Other abnormal and inconclusive findings on diagnostic imaging of breast: Secondary | ICD-10-CM

## 2019-04-30 DIAGNOSIS — N6310 Unspecified lump in the right breast, unspecified quadrant: Secondary | ICD-10-CM

## 2019-04-30 DIAGNOSIS — R599 Enlarged lymph nodes, unspecified: Secondary | ICD-10-CM

## 2019-05-07 ENCOUNTER — Other Ambulatory Visit: Payer: Self-pay | Admitting: Obstetrics and Gynecology

## 2019-05-07 ENCOUNTER — Other Ambulatory Visit: Payer: Self-pay

## 2019-05-07 ENCOUNTER — Ambulatory Visit
Admission: RE | Admit: 2019-05-07 | Discharge: 2019-05-07 | Disposition: A | Discharge status: Home / Self Care | Payer: No Typology Code available for payment source | Source: Ambulatory Visit | Attending: Obstetrics and Gynecology | Admitting: Obstetrics and Gynecology

## 2019-05-07 DIAGNOSIS — N6310 Unspecified lump in the right breast, unspecified quadrant: Secondary | ICD-10-CM

## 2019-05-07 DIAGNOSIS — R599 Enlarged lymph nodes, unspecified: Secondary | ICD-10-CM

## 2019-05-17 NOTE — Progress Notes (Signed)
Mailed normal pap letter and via [place] on 05/17/2019.

## 2019-06-13 ENCOUNTER — Other Ambulatory Visit: Payer: Self-pay

## 2019-06-13 ENCOUNTER — Encounter: Payer: Self-pay | Admitting: Family Medicine

## 2019-06-13 ENCOUNTER — Ambulatory Visit: Payer: Self-pay

## 2019-06-13 ENCOUNTER — Ambulatory Visit: Payer: Medicaid Other | Admitting: Family Medicine

## 2019-06-13 DIAGNOSIS — M545 Low back pain, unspecified: Secondary | ICD-10-CM

## 2019-06-13 DIAGNOSIS — G8929 Other chronic pain: Secondary | ICD-10-CM

## 2019-06-13 MED ORDER — CELECOXIB 200 MG PO CAPS: 200.0000 mg | ORAL_CAPSULE | Freq: Two times a day (BID) | ORAL | 6 refills | Status: DC | PRN

## 2019-06-13 MED ORDER — BACLOFEN 10 MG PO TABS: 5.0000 mg | ORAL_TABLET | Freq: Every evening | ORAL | 3 refills | Status: DC | PRN

## 2019-06-13 NOTE — Progress Notes (Signed)
Office Visit Note   Patient: Shari Berger           Date of Birth: September 06, 1975           MRN: [id number] Visit Date: 06/13/2019 Requested by: Kerin Perna, NP [address] [place],  [place] [address] PCP: Kerin Perna, NP  Subjective: Chief Complaint  Patient presents with  . Lower Back - Pain    Pain across lower back, with numbness in the posterior left thigh x 5 months. NKI.     HPI: She is here with low back pain.  About 5 months ago she was driving from [place], [place] to [place] and after about 2 hours she started noticing pain in the left low back and posterior hip with numbness and a cramping sensation in her left hamstring.  The symptoms improved after getting out of her car, but she started having intermittent similar pain.  Over the next few months the episodes became more constant and now it happens almost anytime she sits more than a few minutes.  The pain affects her ability to sleep on her left side.  Symptoms do not radiate below the knee, denies any bowel or bladder dysfunction.  She went to her PCP who referred her here.  She states that about 6 or 7 years ago she had right-sided low back pain and went to a chiropractor for a while and those symptoms improved but never went away completely.              ROS: No fevers or chills.  All other systems were reviewed and are negative.  Objective: Vital Signs: There were no vitals taken for this visit.  Physical Exam:  General:  Alert and oriented, in no acute distress. Pulm:  Breathing unlabored. Psy:  Normal mood, congruent affect. Skin: No visible rash. Low back: She has some nodularity in the subcutaneous tissue near the SI joints, probably either trigger points or adipose tissue.  Along the spine there is no point tenderness anywhere.  She does have some pain in the left sciatic notch.  Straight leg raise negative, no pain with internal/external hip rotation and her lower extremity  strength and reflexes are normal.  Imaging: X-rays lumbar spine: Anatomic alignment with well-preserved disc spaces.  I question whether she might have a pars defect at L5.  Early degenerative change in both hip joints with subchondral cystic formation in the acetabulum.    Assessment & Plan: 1.  Chronic low back pain with left side radicular symptoms, suspicious for disc protrusion -Physical therapy referral, Celebrex and baclofen as needed.  MRI scan if she fails to improve.     Procedures: No procedures performed  No notes on file     PMFS History: Patient Active Problem List   Diagnosis Date Noted  . Well woman exam with routine gynecological exam 04/16/2019  . Abnormal uterine bleeding (AUB) 02/01/2019  . Tendonitis of foot 04/12/2017  . Encounter for preconception consultation 04/12/2017  . Pelvic pain 04/12/2017  . Health care maintenance 04/12/2017  . Migraines 04/11/2017  . Acid reflux 04/11/2017   Past Medical History:  Diagnosis Date  . Acid reflux   . Fibroid   . Migraines     Family History  Problem Relation Age of Onset  . Depression Mother   . Heart disease Father   . Diabetes Father   . Hypertension Father     History reviewed. No pertinent surgical history. Social History  Occupational History  . Not on file  Tobacco Use  . Smoking status: Never Smoker  . Smokeless tobacco: Never Used  Substance and Sexual Activity  . Alcohol use: Yes    Alcohol/week: 10.0 standard drinks    Types: 10 Glasses of wine per week  . Drug use: No  . Sexual activity: Yes    Partners: Male    Birth control/protection: None

## 2019-07-03 ENCOUNTER — Other Ambulatory Visit: Payer: Self-pay

## 2019-07-03 ENCOUNTER — Encounter: Payer: Self-pay | Admitting: Physical Therapy

## 2019-07-03 ENCOUNTER — Ambulatory Visit: Payer: Medicaid Other | Attending: Family Medicine | Admitting: Physical Therapy

## 2019-07-03 DIAGNOSIS — M62830 Muscle spasm of back: Secondary | ICD-10-CM | POA: Diagnosis present

## 2019-07-03 DIAGNOSIS — M5416 Radiculopathy, lumbar region: Secondary | ICD-10-CM | POA: Insufficient documentation

## 2019-07-03 DIAGNOSIS — M25552 Pain in left hip: Secondary | ICD-10-CM | POA: Insufficient documentation

## 2019-07-03 NOTE — Addendum Note (Signed)
Addended by: Carney Living on: 07/03/2019 02:54 PM   Modules accepted: Orders

## 2019-07-03 NOTE — Therapy (Signed)
[place], [place], [address] Phone: [phone]   Fax:  [phone]  Physical Therapy Evaluation  Patient Details  Name: Shari Berger MRN: [id number] Date of Birth: [birth year] Referring Provider (PT): Dr Eunice Blase    Encounter Date: 07/03/2019  PT End of Session - 07/03/19 1019    Visit Number  1    Number of Visits  4    Date for PT Re-Evaluation  07/31/19    Authorization Type  [employer]    PT Start Time  0930    PT Stop Time  1012    PT Time Calculation (min)  42 min    Activity Tolerance  Patient tolerated treatment well    Behavior During Therapy  [facility] for tasks assessed/performed       Past Medical History:  Diagnosis Date  . Acid reflux   . Fibroid   . Migraines     History reviewed. No pertinent surgical history.  There were no vitals filed for this visit.   Subjective Assessment - 07/03/19 0936    Subjective  Patient had an acute onset of left saide lower back pain that began 5 months ago while she was driving. She has increased pain when she lies on her left side.    Limitations  Walking    How long can you stand comfortably?  can stand    How long can you walk comfortably?  can walk comfortably    Diagnostic tests  X-ray: potential Pars defect    Patient Stated Goals  to have less pain    Currently in Pain?  Yes    Pain Score  0-No pain   pain can reach a 10/10 no pain right now though   Pain Location  Back    Pain Orientation  Left    Pain Descriptors / Indicators  Aching    Pain Type  Chronic pain    Pain Onset  More than a month ago    Pain Frequency  Constant    Aggravating Factors   lying on that side    Pain Relieving Factors  not lying on that side    Multiple Pain Sites  No         [facility] PT Assessment - 07/03/19 0001      Assessment   Medical Diagnosis  Low Back/ left Hip Pain    Referring Provider (PT)  Dr Legrand Como Hilts     Onset Date/Surgical Date  --    5 months prior    Next MD Visit  As needed     Prior Therapy  None       Precautions   Precautions  None      Restrictions   Weight Bearing Restrictions  No      Balance Screen   Has the patient fallen in the past 6 months  No    Has the patient had a decrease in activity level because of a fear of falling?   No    Is the patient reluctant to leave their home because of a fear of falling?   No      Home Environment   Additional Comments  Sometimes she has pain going up and down her steps       Prior Function   Level of Independence  Independent    Vocation  Unemployed    Leisure  Nothing       Cognition   Overall Cognitive Status  Within Functional Limits for tasks assessed    Attention  Focused    Focused Attention  Appears intact    Memory  Appears intact    Awareness  Appears intact    Problem Solving  Appears intact      Observation/Other Assessments   Focus on Therapeutic Outcomes (FOTO)   Medicaid       Sensation   Light Touch  Appears Intact    Additional Comments  Pain radiaties into her posterior thigh       Coordination   Gross Motor Movements are Fluid and Coordinated  Yes    Fine Motor Movements are Fluid and Coordinated  Yes      ROM / Strength   AROM / PROM / Strength  AROM;PROM;Strength      AROM   AROM Assessment Site  Hip;Knee    Right/Left Hip  Right;Left    Left Hip Flexion  --   Painful motion    Left Hip Internal Rotation   --   Painful   Right/Left Knee  Right;Left      PROM   PROM Assessment Site  Hip;Knee    Right/Left Hip  Left    Left Hip Flexion  75   painful to 85 with less pain after manual therapy      Strength   Overall Strength Comments  knee 5/5 bilateral     Strength Assessment Site  Hip;Knee    Right/Left Hip  Right;Left    Right Hip Flexion  4+/5    Right Hip ABduction  4+/5    Right Hip ADduction  4+/5    Left Hip Flexion  4+/5    Left Hip ABduction  5/5    Left Hip ADduction  5/5      Palpation   Palpation  comment  significant tenderness to palpation in the left lateral hip and left lumbar spine       Ambulation/Gait   Gait Comments  Bilateral pronation of the feet. Limited hip flexion bilateral                 Objective measurements completed on examination: See above findings.      [facility] Adult PT Treatment/Exercise - 07/03/19 0001      Lumbar Exercises: Stretches   Passive Hamstring Stretch Limitations  seated with mod cuing for posture and technique     Lower Trunk Rotation Limitations  x10 in low range to improve hip mobility     Piriformis Stretch Limitations  2x20 sec hold with towel so she dosent have to reach     Other Lumbar Stretch Exercise  trigger point release with tennis ball       Manual Therapy   Manual Therapy  Soft tissue mobilization;Joint mobilization    Joint Mobilization  inferior and postierior hip glides inferior grade II and III posterior grade 4 to improve flexion     Soft tissue mobilization  to posterior hip             PT Education - 07/03/19 1445    Education Details  HEP and symptom mangement    Person(s) Educated  Patient    Methods  Explanation;Demonstration;Tactile cues;Verbal cues    Comprehension  Verbalized understanding;Returned demonstration;Verbal cues required;Tactile cues required       PT Short Term Goals - 07/03/19 1449      PT SHORT TERM GOAL #1   Title  Patient will increase lumbar flexion by 20 degrees without pain  Baseline  40 degrees    Time  3    Period  Weeks    Status  New    Target Date  07/24/19      PT SHORT TERM GOAL #2   Title  Patient will increase left hip gross strength to 5/5    Baseline  4/5 hip flexion 4+/5    Time  3    Period  Weeks    Status  New    Target Date  07/24/19      PT SHORT TERM GOAL #3   Title  Patient will be independent with basic HEP for hip strength and mobility    Time  3    Period  Weeks    Status  New    Target Date  07/24/19                Plan -  07/03/19 1019    Clinical Impression Statement  Patient is a 44 year old female with left sided hip and low back pain for the past 5 months. She has increased pain when she sits and when she lies on that side. She has decreased hip flexion and internal rotation on the left side. She has a mild strength deficit on the left. She has spasming in her lumbar spine and gluteal. She had improved hip flexion with manual therapy. She would benefit from skilled therapy to improve movement of the left hip and ability to sit and sleep.    Personal Factors and Comorbidities  Comorbidity 1    Comorbidities  migranes    Examination-Activity Limitations  Sit;Sleep    Examination-Participation Restrictions  Cleaning;Community Activity;Driving    Stability/Clinical Decision Making  Evolving/Moderate complexity   increasing  pain that is effecting her ability to sleep and sit   Clinical Decision Making  Moderate    Rehab Potential  Good    PT Frequency  1x / week    PT Duration  4 weeks    PT Treatment/Interventions  ADLs/Self Care Home Management;Cryotherapy;Electrical Stimulation;Iontophoresis 4mg /ml Dexamethasone;Functional mobility training;Patient/family education;Therapeutic exercise;Therapeutic activities;Neuromuscular re-education;Manual techniques;Dry needling;Passive range of motion    PT Next Visit Plan  continue to work on hip mobilization, soft tissue mobilization, and lumbar mobilization as tolerated. begin light strengthening.; consdier supine clamshell, consider quad set; consider TZpDN if patient is comfortable with it. TPDN not discussed this visit.; progress to standing exercises as tolerated.    PT Home Exercise Plan  pirifromis stretch, LTR, seeated hamstring stretch, trigger point tennis ball release    Consulted and Agree with Plan of Care  Patient       Patient will benefit from skilled therapeutic intervention in order to improve the following deficits and impairments:  Pain, Decreased  mobility, Increased muscle spasms, Impaired flexibility, Decreased endurance, Decreased activity tolerance, Decreased range of motion  Visit Diagnosis: Radiculopathy, lumbar region  Muscle spasm of back  Pain in left hip     Problem List Patient Active Problem List   Diagnosis Date Noted  . Well woman exam with routine gynecological exam 04/16/2019  . Abnormal uterine bleeding (AUB) 02/01/2019  . Tendonitis of foot 04/12/2017  . Encounter for preconception consultation 04/12/2017  . Pelvic pain 04/12/2017  . Health care maintenance 04/12/2017  . Migraines 04/11/2017  . Acid reflux 04/11/2017    Carney Living PT DPT  07/03/2019, 2:52 PM  [facility] [address] [place], [place], [address] Phone: [phone]   Fax:  [phone]  Name: Shari Berger MRN: [id number] Date of Birth: [birth year]

## 2019-07-15 ENCOUNTER — Ambulatory Visit: Payer: Medicaid Other | Admitting: Physical Therapy

## 2019-07-22 ENCOUNTER — Encounter: Payer: Medicaid Other | Admitting: Physical Therapy

## 2019-07-29 ENCOUNTER — Encounter: Payer: Medicaid Other | Admitting: Physical Therapy

## 2019-10-14 ENCOUNTER — Emergency Department (HOSPITAL_COMMUNITY)
Admission: EM | Admit: 2019-10-14 | Discharge: 2019-10-15 | Disposition: A | Discharge status: Home / Self Care | Payer: Medicaid Other | Attending: Emergency Medicine | Admitting: Emergency Medicine

## 2019-10-14 ENCOUNTER — Encounter (HOSPITAL_COMMUNITY): Payer: Self-pay | Admitting: Emergency Medicine

## 2019-10-14 DIAGNOSIS — Z5321 Procedure and treatment not carried out due to patient leaving prior to being seen by health care provider: Secondary | ICD-10-CM | POA: Insufficient documentation

## 2019-10-14 DIAGNOSIS — N939 Abnormal uterine and vaginal bleeding, unspecified: Secondary | ICD-10-CM | POA: Insufficient documentation

## 2019-10-14 DIAGNOSIS — R109 Unspecified abdominal pain: Secondary | ICD-10-CM | POA: Diagnosis present

## 2019-10-14 LAB — URINALYSIS, ROUTINE W REFLEX MICROSCOPIC
Bacteria, UA: NONE SEEN
Bilirubin Urine: NEGATIVE
Glucose, UA: NEGATIVE mg/dL
Ketones, ur: 5 mg/dL — AB
Leukocytes,Ua: NEGATIVE
Nitrite: NEGATIVE
Protein, ur: 30 mg/dL — AB
Specific Gravity, Urine: 1.03 (ref 1.005–1.030)
pH: 5 (ref 5.0–8.0)

## 2019-10-14 LAB — CBC
HCT: 32.9 % — ABNORMAL LOW (ref 36.0–46.0)
Hemoglobin: 9.7 g/dL — ABNORMAL LOW (ref 12.0–15.0)
MCH: 20 pg — ABNORMAL LOW (ref 26.0–34.0)
MCHC: 29.5 g/dL — ABNORMAL LOW (ref 30.0–36.0)
MCV: 68 fL — ABNORMAL LOW (ref 80.0–100.0)
Platelets: 367 10*3/uL (ref 150–400)
RBC: 4.84 MIL/uL (ref 3.87–5.11)
RDW: 17.6 % — ABNORMAL HIGH (ref 11.5–15.5)
WBC: 5.8 10*3/uL (ref 4.0–10.5)
nRBC: 0 % (ref 0.0–0.2)

## 2019-10-14 LAB — COMPREHENSIVE METABOLIC PANEL
ALT: 18 U/L (ref 0–44)
AST: 10 U/L — ABNORMAL LOW (ref 15–41)
Albumin: 3 g/dL — ABNORMAL LOW (ref 3.5–5.0)
Alkaline Phosphatase: 56 U/L (ref 38–126)
Anion gap: 9 (ref 5–15)
BUN: 12 mg/dL (ref 6–20)
CO2: 20 mmol/L — ABNORMAL LOW (ref 22–32)
Calcium: 8.3 mg/dL — ABNORMAL LOW (ref 8.9–10.3)
Chloride: 109 mmol/L (ref 98–111)
Creatinine, Ser: 0.79 mg/dL (ref 0.44–1.00)
GFR calc Af Amer: >60 mL/min (ref >60)
GFR calc non Af Amer: >60 mL/min (ref >60)
Glucose, Bld: 88 mg/dL (ref 70–99)
Potassium: 3.6 mmol/L (ref 3.5–5.1)
Sodium: 138 mmol/L (ref 135–145)
Total Bilirubin: 0.4 mg/dL (ref 0.3–1.2)
Total Protein: 7.3 g/dL (ref 6.5–8.1)

## 2019-10-14 LAB — I-STAT BETA HCG BLOOD, ED (MC, WL, AP ONLY): I-stat hCG, quantitative: <5 m[IU]/mL (ref <5)

## 2019-10-14 LAB — LIPASE, BLOOD: Lipase: 23 U/L (ref 11–51)

## 2019-10-14 MED ORDER — SODIUM CHLORIDE 0.9% FLUSH: 3.0000 mL | Freq: Once | INTRAVENOUS | Status: DC

## 2019-10-14 NOTE — ED Notes (Signed)
PT left and stated that she will come back if her pain does not get better.

## 2019-10-14 NOTE — ED Triage Notes (Signed)
Pt states she is been having vaginal bleeding for a month and she is having abd pain.

## 2019-10-28 ENCOUNTER — Ambulatory Visit (INDEPENDENT_AMBULATORY_CARE_PROVIDER_SITE_OTHER): Payer: Medicaid Other | Admitting: Primary Care

## 2019-10-28 ENCOUNTER — Encounter (INDEPENDENT_AMBULATORY_CARE_PROVIDER_SITE_OTHER): Payer: Self-pay | Admitting: Primary Care

## 2019-10-28 ENCOUNTER — Other Ambulatory Visit: Payer: Self-pay

## 2019-10-28 VITALS — BP 130/83 | HR 96 | Temp 98.1°F | Ht 65.0 in | Wt 231.4 lb

## 2019-10-28 DIAGNOSIS — R030 Elevated blood-pressure reading, without diagnosis of hypertension: Secondary | ICD-10-CM | POA: Diagnosis not present

## 2019-10-28 DIAGNOSIS — D219 Benign neoplasm of connective and other soft tissue, unspecified: Secondary | ICD-10-CM

## 2019-10-28 DIAGNOSIS — N921 Excessive and frequent menstruation with irregular cycle: Secondary | ICD-10-CM | POA: Diagnosis not present

## 2019-10-28 DIAGNOSIS — D500 Iron deficiency anemia secondary to blood loss (chronic): Secondary | ICD-10-CM | POA: Diagnosis not present

## 2019-10-28 NOTE — Progress Notes (Signed)
Acute Office Visit  Subjective:    Patient ID: Shari Berger, female    DOB: [birth year], 44 y.o.   MRN: [id number]  Chief Complaint  Patient presents with  . constant bleeding    HPI [name] is a 44 year old female with menorrhalgia, lightheaded, dizzy, fatigue, she is having to change her tampons every hour and wear elbow pads 1 on top and one on the bottom with breakthrough blood.  She has a history of uterine fibroids .  She has anemia secondary to chronic blood loss Past Medical History:  Diagnosis Date  . Acid reflux   . Fibroid   . Migraines     No past surgical history on file.  Family History  Problem Relation Age of Onset  . Depression Mother   . Heart disease Father   . Diabetes Father   . Hypertension Father     Social History   Socioeconomic History  . Marital status: Single    Spouse name: Not on file  . Number of children: Not on file  . Years of education: Not on file  . Highest education level: 12th grade  Occupational History  . Not on file  Tobacco Use  . Smoking status: Never Smoker  . Smokeless tobacco: Never Used  Vaping Use  . Vaping Use: Never used  Substance and Sexual Activity  . Alcohol use: Yes    Alcohol/week: 10.0 standard drinks    Types: 10 Glasses of wine per week  . Drug use: No  . Sexual activity: Yes    Partners: Male    Birth control/protection: None  Other Topics Concern  . Not on file  Social History Narrative  . Not on file   Social Determinants of Health   Financial Resource Strain:   . Difficulty of Paying Living Expenses:   Food Insecurity:   . Worried About Charity fundraiser in the Last Year:   . Arboriculturist in the Last Year:   Transportation Needs: No Transportation Needs  . Lack of Transportation (Medical): No  . Lack of Transportation (Non-Medical): No  Physical Activity:   . Days of Exercise per Week:   . Minutes of Exercise per Session:   Stress:   . Feeling of Stress :   Social  Connections:   . Frequency of Communication with Friends and Family:   . Frequency of Social Gatherings with Friends and Family:   . Attends Religious Services:   . Active Member of Clubs or Organizations:   . Attends Archivist Meetings:   Marland Kitchen Marital Status:   Intimate Partner Violence:   . Fear of Current or Ex-Partner:   . Emotionally Abused:   Marland Kitchen Physically Abused:   . Sexually Abused:     Outpatient Medications Prior to Visit  Medication Sig Dispense Refill  . megestrol (MEGACE) 40 MG tablet Take 40 mg by mouth daily.    . baclofen (LIORESAL) 10 MG tablet Take 0.5-1 tablets (5-10 mg total) by mouth at bedtime as needed for muscle spasms. (Patient not taking: Reported on 10/28/2019) 30 each 3  . celecoxib (CELEBREX) 200 MG capsule Take 1 capsule (200 mg total) by mouth 2 (two) times daily as needed. (Patient not taking: Reported on 10/28/2019) 60 capsule 6  . omeprazole (PRILOSEC) 20 MG capsule Take 1 capsule (20 mg total) by mouth daily. (Patient not taking: Reported on 10/28/2019) 30 capsule 3  . ibuprofen (ADVIL) 800 MG tablet Take 1 tablet (800  mg total) by mouth every 8 (eight) hours as needed. 90 tablet 1   No facility-administered medications prior to visit.    No Known Allergies  Review of Systems  Constitutional: Positive for fatigue.  Genitourinary: Positive for menstrual problem, pelvic pain and vaginal pain.  Neurological: Positive for dizziness, weakness and light-headedness.  All other systems reviewed and are negative.      Objective:    Physical Exam Vitals reviewed.  Constitutional:      Appearance: She is obese.  HENT:     Head: Normocephalic.     Right Ear: Tympanic membrane normal.     Left Ear: Tympanic membrane normal.  Eyes:     Extraocular Movements: Extraocular movements intact.     Pupils: Pupils are equal, round, and reactive to light.  Cardiovascular:     Rate and Rhythm: Normal rate and regular rhythm.     Pulses: Normal pulses.      Heart sounds: Normal heart sounds.  Pulmonary:     Effort: Pulmonary effort is normal.     Breath sounds: Normal breath sounds.  Abdominal:     General: Bowel sounds are normal.     Tenderness: There is abdominal tenderness. There is guarding.  Musculoskeletal:        General: Normal range of motion.     Cervical back: Normal range of motion and neck supple.  Skin:    General: Skin is warm and dry.  Neurological:     Mental Status: She is alert and oriented to person, place, and time.  Psychiatric:        Mood and Affect: Mood normal.        Behavior: Behavior normal.        Thought Content: Thought content normal.        Judgment: Judgment normal.     BP 130/83 (BP Location: Right Arm, Patient Position: Sitting, Cuff Size: Large)   Pulse 96   Temp 98.1 F (36.7 C) (Oral)   Ht 5\' 5"  (1.651 m)   Wt 231 lb 6.4 oz (105 kg)   SpO2 99%   BMI 38.51 kg/m  Wt Readings from Last 3 Encounters:  10/28/19 231 lb 6.4 oz (105 kg)  10/14/19 260 lb (117.9 kg)  04/19/19 229 lb 9.6 oz (104.1 kg)    Health Maintenance Due  Topic Date Due  . Hepatitis C Screening  Never done  . COVID-19 Vaccine (1) Never done  . HIV Screening  Never done  . INFLUENZA VACCINE  10/27/2019    There are no preventive care reminders to display for this patient.   No results found for: TSH Lab Results  Component Value Date   WBC 5.8 10/14/2019   HGB 9.7 (L) 10/14/2019   HCT 32.9 (L) 10/14/2019   MCV 68.0 (L) 10/14/2019   PLT 367 10/14/2019   Lab Results  Component Value Date   NA 138 10/14/2019   K 3.6 10/14/2019   CO2 20 (L) 10/14/2019   GLUCOSE 88 10/14/2019   BUN 12 10/14/2019   CREATININE 0.79 10/14/2019   BILITOT 0.4 10/14/2019   ALKPHOS 56 10/14/2019   AST 10 (L) 10/14/2019   ALT 18 10/14/2019   PROT 7.3 10/14/2019   ALBUMIN 3.0 (L) 10/14/2019   CALCIUM 8.3 (L) 10/14/2019   ANIONGAP 9 10/14/2019   No results found for: CHOL No results found for: HDL No results found for:  LDLCALC No results found for: TRIG No results found for: CHOLHDL No results  found for: HGBA1C     Assessment & Plan:  Shari Berger was seen today for constant bleeding.  Diagnoses and all orders for this visit:  Iron deficiency anemia due to chronic blood loss October 14 2019 hemoglobin 9.7 hematocrit 32.9 RDW 17.6 MCV MCH indicating smaller in size other values mention of the is iron deficiency anemia pain acute on chronic blood loss.  Menorrhagia with irregular cycle Patient has been suffering with menorrhagia for 3 months with ongoing menstrual cycles needing to change personal care every 1-2 hours.  Underlying cause  may be contributed to uterine fibroids will refer to GYN  Fibroids Patient has a history of uterine fibroids having abdominal pain pelvic pain and vaginal pain with menorrhalgia will refer to GYN  Elevated blood-pressure reading without diagnosis of hypertension Patient is aware she does not have high blood pressure today, readings are 130/83 diagnosis for hypertension is 130/80 on 3 separate occasions for dx of HTN..  We discussed limiting sodium intake trying to relieve stress smart choices with snacking  read labels and exercise 30 minutes daily or 150 weekly.  Will reevaluate blood pressure in 3 months  No orders of the defined types were placed in this encounter.    Kerin Perna, NP

## 2019-10-28 NOTE — Patient Instructions (Signed)
Uterine Fibroids  Uterine fibroids are lumps of tissue (tumors) in your womb (uterus). They are not cancer (are benign). Most women with this condition do not need treatment. Sometimes fibroids can affect your ability to have children (your fertility). If that happens, you may need surgery to take out the fibroids. Follow these instructions at home:  Take over-the-counter and prescription medicines only as told by your doctor. Your doctor may suggest NSAIDs (such as aspirin or ibuprofen) to help with pain.  Ask your doctor if you should: ? Take iron pills. ? Eat more foods that have iron in them, such as dark green, leafy vegetables.  If directed, apply heat to your back or belly to reduce pain. Use the heat source that your doctor recommends, such as a moist heat pack or a heating pad. ? Put a towel between your skin and the heat source. ? Leave the heat on for 20-30 minutes. ? Remove the heat if your skin turns bright red. This is especially important if you are unable to feel pain, heat, or cold. You may have a greater risk of getting burned.  Pay close attention to your period (menstrual) cycles. Tell your doctor about any changes, such as: ? A heavier blood flow than usual. ? Needing to use more pads or tampons than normal. ? A change in how many days your period lasts. ? A change in symptoms that come with your period, such as cramps or back pain.  Keep all follow-up visits as told by your doctor. This is important. Your doctor may need to watch your fibroids over time for any changes. Contact a doctor if you:  Have pain that does not get better with medicine or heat, such as pain or cramps in: ? Your back. ? The area between your hip bones (pelvic area). ? Your belly.  Have new bleeding between your periods.  Have more bleeding during or between your periods.  Feel very tired or weak.  Feel light-headed. Get help right away if you:  Pass out (faint).  Have pain in the  area between your hip bones that suddenly gets worse.  Have bleeding that soaks a tampon or pad in 30 minutes or less. Summary  Uterine fibroids are lumps of tissue (tumors) in your womb (uterus). They are not cancer.  The only treatment that most women need is taking aspirin or ibuprofen for pain.  Contact a doctor if you have pain or cramps that do not get better with medicine.  Make sure you know what symptoms you should get help for right away. This information is not intended to replace advice given to you by your health care provider. Make sure you discuss any questions you have with your health care provider. Document Revised: 02/24/2017 Document Reviewed: 02/07/2017 Elsevier Patient Education  2020 [employer]  

## 2019-10-28 NOTE — Progress Notes (Signed)
Pain from bleeding feels like she is having contractions

## 2019-11-25 ENCOUNTER — Other Ambulatory Visit (HOSPITAL_COMMUNITY)
Admission: RE | Admit: 2019-11-25 | Discharge: 2019-11-25 | Disposition: A | Discharge status: Home / Self Care | Payer: Medicaid Other | Source: Ambulatory Visit | Attending: Obstetrics & Gynecology | Admitting: Obstetrics & Gynecology

## 2019-11-25 ENCOUNTER — Encounter: Payer: Self-pay | Admitting: Obstetrics & Gynecology

## 2019-11-25 ENCOUNTER — Ambulatory Visit (INDEPENDENT_AMBULATORY_CARE_PROVIDER_SITE_OTHER): Payer: Medicaid Other | Admitting: Obstetrics & Gynecology

## 2019-11-25 ENCOUNTER — Other Ambulatory Visit: Payer: Self-pay

## 2019-11-25 VITALS — BP 137/82 | HR 103 | Wt 227.0 lb

## 2019-11-25 DIAGNOSIS — N939 Abnormal uterine and vaginal bleeding, unspecified: Secondary | ICD-10-CM

## 2019-11-25 DIAGNOSIS — D219 Benign neoplasm of connective and other soft tissue, unspecified: Secondary | ICD-10-CM | POA: Diagnosis not present

## 2019-11-25 DIAGNOSIS — D649 Anemia, unspecified: Secondary | ICD-10-CM | POA: Diagnosis not present

## 2019-11-25 MED ORDER — MEGESTROL ACETATE 40 MG PO TABS: 80.0000 mg | ORAL_TABLET | Freq: Every day | ORAL | 2 refills | Status: DC

## 2019-11-25 MED ORDER — IBUPROFEN 800 MG PO TABS
800.0000 mg | ORAL_TABLET | Freq: Once | ORAL | Status: AC
  Administered 2019-11-25: 800 mg via ORAL

## 2019-11-25 MED ORDER — NAPROXEN 500 MG PO TABS: 500.0000 mg | ORAL_TABLET | Freq: Two times a day (BID) | ORAL | 2 refills | Status: DC

## 2019-11-25 NOTE — Patient Instructions (Addendum)
ENDOMETRIAL BIOPSY POST-PROCEDURE INSTRUCTIONS  1. You may take Ibuprofen, Aleve or Tylenol for pain if needed.  Cramping should resolve within in 24 hours.  2. You may have a small amount of spotting.  You should wear a mini pad for the next few days.  3. You may have intercourse after 24 hours.  4. You need to call if you have any pelvic pain, fever, heavy bleeding or foul smelling vaginal discharge.  5. Shower or bathe as normal  6. We will call you within one week with results or we will discuss   the results at your follow-up appointment if needed.     Hysterectomy Information  A hysterectomy is a surgery in which the uterus is removed. The fallopian tubes and ovaries may be removed (bilateral salpingo-oophorectomy) as well. This procedure may be done to treat various medical problems. After the procedure, a woman will no longer have menstrual periods nor will she be able to become pregnant (sterile). What are the reasons for a hysterectomy? There are many reasons why a woman might have this procedure. They include:  Persistent, abnormal vaginal bleeding.  Long-term (chronic) pelvic pain or infection.  Endometriosis. This is when the lining of the uterus (endometrium) starts to grow outside the uterus.  Adenomyosis. This is when the endometrium starts to grow in the muscle of the uterus.  Pelvic organ prolapse. This is a condition in which the uterus falls down into the vagina.  Noncancerous growths in the uterus (uterine fibroids) that cause symptoms.  The presence of precancerous cells.  Cervical or uterine cancer. What are the different types of hysterectomy? There are three different types of hysterectomy:  Supracervical hysterectomy. In this type, the top part of the uterus is removed, but not the cervix.  Total hysterectomy. In this type, the uterus and cervix are removed.  Radical hysterectomy. In this type, the uterus, the cervix, and the tissue that holds  the uterus in place (parametrium) are removed. What are the different ways a hysterectomy can be performed? There are many different ways a hysterectomy can be performed, including:  Abdominal hysterectomy. In this type, an incision is made in the abdomen. The uterus is removed through this incision.  Vaginal hysterectomy. In this type, an incision is made in the vagina. The uterus is removed through this incision. There are no abdominal incisions.  Conventional laparoscopic hysterectomy. In this type, three or four small incisions are made in the abdomen. A thin, lighted tube with a camera (laparoscope) is inserted into one of the incisions. Other tools are put through the other incisions. The uterus is cut into small pieces. The small pieces are removed through the incisions or through the vagina.  Laparoscopically assisted vaginal hysterectomy (LAVH). In this type, three or four small incisions are made in the abdomen. Part of the surgery is performed laparoscopically and the other part is done vaginally. The uterus is removed through the vagina.  Robot-assisted laparoscopic hysterectomy. In this type, a laparoscope and other tools are inserted into three or four small incisions in the abdomen. A computer-controlled device is used to give the surgeon a 3D image and to help control the surgical instruments. This allows for more precise movements of surgical instruments. The uterus is cut into small pieces and removed through the incisions or removed through the vagina. Discuss the options with your health care provider to determine which type is the right one for you. What are the risks? Generally, this is a safe procedure.  However, problems may occur, including:  Bleeding and risk of blood transfusion. Tell your health care provider if you do not want to receive any blood products.  Blood clots in the legs or lung.  Infection.  Damage to other structures or organs.  Allergic reactions to  medicines.  Changing to an abdominal hysterectomy from one of the other techniques. What to expect after a hysterectomy  You will be given pain medicine.  You may need to stay in the hospital for 1- 2 days to recover, depending on the type of hysterectomy you had.  Follow your health care provider's instructions about exercise, driving, and general activities. Ask your health care provider what activities are safe for you.  You will need to have someone with you for the first 3-5 days after you go home.  You will need to follow up with your surgeon in 2-4 weeks after surgery to evaluate your progress.  If the ovaries are removed, you will have early menopause symptoms such as hot flashes, night sweats, and insomnia.  If you had a hysterectomy for a problem that was not cancer or not a condition that could lead to cancer, then you no longer need Pap tests. However, even if you no longer need a Pap test, a regular pelvic exam is a good idea to make sure no other problems are developing. Questions to ask your health care provider  Is a hysterectomy medically necessary? Do I have other treatment options for my condition?  What are my options for hysterectomy procedure?  What organs and tissues need to be removed?  What are the risks?  What are the benefits?  How long will I need to stay in the hospital after the procedure?  How long will I need to recover at home?  What symptoms can I expect after the procedure? Summary  A hysterectomy is a surgery in which the uterus is removed. The fallopian tubes and ovaries may be removed (bilateral salpingo-oophorectomy) as well.  This procedure may be done to treat various medical problems. After the procedure, a woman will no longer have menstrual periods nor will she be able to become pregnant.  Discuss the options with your health care provider to determine which type of hysterectomy is the right one for you. This information is not  intended to replace advice given to you by your health care provider. Make sure you discuss any questions you have with your health care provider. Document Revised: 02/24/2017 Document Reviewed: 04/20/2016 Elsevier Patient Education  2020 [employer].

## 2019-11-25 NOTE — Progress Notes (Signed)
GYNECOLOGY OFFICE VISIT NOTE  History:   Shari Berger is a 44 y.o. G2P1011 here today for discussion about surgical management for chronic AUB, known fibroids and symptomatic anemia. Has used Megace in past, but does not want to do this long term. She also has a lot of bleeding and associated pain; has been bleeding since May. Last Hgb last month was 9.7, feels very tired all the time. She desires definitive surgical management. She denies any other gynecologic concerns.    Past Medical History:  Diagnosis Date  . Acid reflux   . Fibroid   . Migraines     History reviewed. No pertinent surgical history.  The following portions of the patient's history were reviewed and updated as appropriate: allergies, current medications, past family history, past medical history, past social history, past surgical history and problem list.   Health Maintenance:  Normal pap and negative HRHPV on 04/16/2019.  Normal mammogram on 05/07/2019.   Review of Systems:  Pertinent items noted in HPI and remainder of comprehensive ROS otherwise negative.  Physical Exam:  BP 137/82   Pulse (!) 103   Wt 227 lb (103 kg)   LMP 07/27/2019 (Approximate)   BMI 37.77 kg/m  CONSTITUTIONAL: Well-developed, well-nourished female in no acute distress.  HEENT:  Normocephalic, atraumatic. External right and left ear normal. No scleral icterus.  NECK: Normal range of motion, supple, no masses noted on observation SKIN: No rash noted. Not diaphoretic. No erythema. No pallor. MUSCULOSKELETAL: Normal range of motion. No edema noted. NEUROLOGIC: Alert and oriented to person, place, and time. Normal muscle tone coordination. No cranial nerve deficit noted. PSYCHIATRIC: Normal mood and affect. Normal behavior. Normal judgment and thought content. CARDIOVASCULAR: Normal heart rate noted RESPIRATORY: Effort and breath sounds normal, no problems with respiration noted ABDOMEN: No masses noted. No other overt distention noted.     PELVIC: Normal appearing external genitalia; normal urethral meatus; normal appearing vaginal mucosa and cervix.  Old blood noted in vagina.  Normal uterine size, no other palpable masses, no uterine or adnexal tenderness. Performed in the presence of a chaperone.  ENDOMETRIAL BIOPSY     The indications for endometrial biopsy were reviewed.   Risks of the biopsy including cramping, bleeding, infection, uterine perforation, inadequate specimen and need for additional procedures  were discussed. The patient states she understands and agrees to undergo procedure today. Consent was signed. Time out was performed. Urine HCG was negative. During the pelvic exam, the cervix was prepped with Betadine. A single-toothed tenaculum was placed on the anterior lip of the cervix to stabilize it. The 3 mm pipelle was introduced into the endometrial cavity without difficulty to a depth of 10 cm, and a moderate amount of blood clots and tissue was obtained and sent to pathology. The instruments were removed from the patient's vagina. Minimal bleeding from the cervix was noted. The patient tolerated the procedure well. Routine post-procedure instructions were given to the patient.      Labs and Imaging CBC Latest Ref Rng & Units 10/14/2019 12/03/2018 04/17/2018  WBC 4.0 - 10.5 K/uL 5.8 7.0 8.7  Hemoglobin 12.0 - 15.0 g/dL 9.7(L) 10.8(L) 11.3(L)  Hematocrit 36 - 46 % 32.9(L) 36.1 36.6  Platelets 150 - 400 K/uL 367 392 352   02/19/2019 Pelvic Ultrasound Findings:   Uterus: Measurements: 10.5 x 6.3 x 6.5 cm = volume: 224 mL. Retroverted.  Multiple uterine masses consistent with leiomyomata. These include a large RIGHT lateral leiomyoma 5.6 x 4.9 x 4.7 cm,  subserosal, smaller posterior LEFT leiomyomata 3.9 x 3.2 x 3.2 cm and 3.2 x 3.1x 3.0 cm both subserosal, and a submucosal leiomyoma at anterior RIGHT uterus 3.0 x 3.0 x 2.9 cm Adnexa: Normal     Assessment and Plan:      1. Abnormal uterine bleeding (AUB) 2. Fibroids   3.Symptomatic anemia Endometrial biopsy done, will follow up results and manage accordingly. Follow up ultrasound ordered, if fibroids significantly bigger, that may alter hysterectomy modality Megace and Naproxen prescribed for AUB and pain for now.  Patient is aware of delay with scheduling elective surgeries due to current COVID surge. Patient desires definitive management with hysterectomy.  I proposed doing a total vaginal hysterectomy (TVH) and prophylactic bilateral salpingectomy.  No indication for oophorectomy.  Patient agrees with this proposed surgery.  The risks of surgery were discussed in detail with the patient including but not limited to: bleeding which may require transfusion or reoperation; infection which may require antibiotics; injury to bowel, bladder, ureters or other surrounding organs; need for additional procedures including laparotomy; formation of adhesions; thromboembolic phenomenon; incisional problems and other postoperative/anesthesia complications.  Patient was also advised that she will remain in house for 1 night; and expected recovery time after a hysterectomy is 6-8 weeks.  Patient was told that the likelihood that her condition and symptoms will be treated effectively with this surgical management was very high; the postoperative expectations were also discussed in detail. The patient also understands the alternative treatment options which were discussed in full. All questions were answered.  She was told that she will be contacted by our surgical scheduler regarding the time and date of her surgery; routine preoperative instructions will be given to her by the preoperative nursing team.   She is aware of need for preoperative COVID testing and subsequent quarantine from time of test to time of surgery; she will be given further preoperative instructions at that [place] screening visit.   Routine postoperative instructions will be reviewed with the patient in detail after  surgery.  In the meantime, she will continue Megace and Naproxen as prescribed; bleeding precautions were reviewed. Printed patient education handouts about the procedure was given to the patient to review at home. - US PELVIC COMPLETE WITH TRANSVAGINAL; Future - Surgical pathology - megestrol (MEGACE) 40 MG tablet; Take 2 tablets (80 mg total) by mouth daily. Can increase to two tablets twice a day for heavy bleeding  Dispense: 60 tablet; Refill: 2 - naproxen (NAPROSYN) 500 MG tablet; Take 1 tablet (500 mg total) by mouth 2 (two) times daily with a meal. As needed for pain and bleeding  Dispense: 60 tablet; Refill: 2  Routine preventative health maintenance measures emphasized. Please refer to After Visit Summary for other counseling recommendations.   Return for any gynecologic concerns.    Total face-to-face time with patient: 25 minutes.  Over 50% of encounter was spent on counseling and coordination of care.   Verita Schneiders, MD, [place] for [employer], [place]

## 2019-11-27 ENCOUNTER — Telehealth (INDEPENDENT_AMBULATORY_CARE_PROVIDER_SITE_OTHER): Payer: Medicaid Other | Admitting: Lactation Services

## 2019-11-27 DIAGNOSIS — N939 Abnormal uterine and vaginal bleeding, unspecified: Secondary | ICD-10-CM

## 2019-11-27 LAB — SURGICAL PATHOLOGY

## 2019-11-27 NOTE — Telephone Encounter (Signed)
Called patient to let her know that her Endometrial Biopsy results are benign. Patient pleased with results. She has no further questions or concerns at this time.

## 2019-11-27 NOTE — Telephone Encounter (Signed)
-----   Message from Osborne Oman, MD sent at 11/27/2019 12:46 PM EDT ----- Benign endometrial biopsy. Please call to inform patient of results.

## 2019-11-28 ENCOUNTER — Telehealth: Payer: Self-pay

## 2019-11-28 NOTE — Telephone Encounter (Addendum)
-----   Message from Francia Greaves sent at 11/27/2019  2:14 PM EDT ----- Regarding: [place] - SURGERY 09/28   Called pt; notified pt of need to sign hysterectomy statement prior to surgery. Pt states she will come into the office today after work. Surgery date and time reviewed with pt at her request; explained a letter is coming by mail with this information.

## 2019-12-05 ENCOUNTER — Encounter: Payer: Self-pay | Admitting: *Deleted

## 2019-12-10 ENCOUNTER — Other Ambulatory Visit: Payer: Self-pay

## 2019-12-10 ENCOUNTER — Ambulatory Visit
Admission: RE | Admit: 2019-12-10 | Discharge: 2019-12-10 | Disposition: A | Discharge status: Home / Self Care | Payer: Medicaid Other | Source: Ambulatory Visit | Attending: Obstetrics & Gynecology | Admitting: Obstetrics & Gynecology

## 2019-12-10 ENCOUNTER — Ambulatory Visit: Payer: Medicaid Other

## 2019-12-10 DIAGNOSIS — N939 Abnormal uterine and vaginal bleeding, unspecified: Secondary | ICD-10-CM | POA: Insufficient documentation

## 2019-12-10 DIAGNOSIS — D219 Benign neoplasm of connective and other soft tissue, unspecified: Secondary | ICD-10-CM | POA: Diagnosis not present

## 2019-12-13 ENCOUNTER — Telehealth: Payer: Self-pay | Admitting: Lactation Services

## 2019-12-13 NOTE — Telephone Encounter (Signed)
Called patient to let her know she needs to come by office to sign Hysterectomy Statement. LM on her voicemail that she needs to come by the office ASAP to sign paperwork.

## 2019-12-13 NOTE — Telephone Encounter (Signed)
-----   Message from Francia Greaves sent at 12/13/2019 10:20 AM EDT ----- Regarding: Needs Hysterectomy Statement ASAP - Surgery 09/28 Please do not put a label on it this time

## 2019-12-17 NOTE — Patient Instructions (Addendum)
DUE TO COVID-19 ONLY ONE VISITOR IS ALLOWED IN WAITING ROOM (VISITOR WILL HAVE A TEMPERATURE CHECK ON ARRIVAL AND MUST WEAR A FACE MASK THE ENTIRE TIME.)  ONCE YOU ARE ADMITTED TO YOUR PRIVATE ROOM, THE SAME ONE VISITOR IS ALLOWED TO VISIT DURING VISITING HOURS ONLY.  Your COVID swab testing is scheduled for Friday, 12-20-19 at 2:40 PM,  You must self quarantine after your testing per handout given to you at the testing site. [address] [place], [place] [address]  (Must self quarantine after testing. Follow instructions on handout.)       Your procedure is scheduled on:  Tuesday, 12-24-19  Report to [facility].   Call this number if you have problems the morning of surgery:[phone].   OUR ADDRESS IS [place].  WE ARE LOCATED IN [facility].                                     REMEMBER:  DO NOT EAT FOOD AFTER MIDNIGHT .    MAY HAVE CLEAR LIQUIDS FROM MIDNIGHT UNTIL 10:15 AM  CLEAR LIQUID DIET  Foods Allowed                                                                     Foods Excluded  Water, Black Coffee and tea, regular and decaf             liquids that you cannot  Plain Jell-O in any flavor  (No red)                                   see through such as: Fruit ices (not with fruit pulp)                                      milk, soups, orange juice              Iced Popsicles (No red)                                      All solid food                                   Apple juices Sports drinks like Gatorade (No red) Lightly seasoned clear broth or consume(fat free) Sugar, honey syrup  BRUSH YOUR TEETH THE MORNING OF SURGERY.  TAKE THESE MEDICATIONS MORNING OF SURGERY WITH A SIP OF WATER:  None  DO NOT WEAR JEWERLY, MAKE UP, OR NAIL POLISH.  DO NOT WEAR LOTIONS, POWDERS, PERFUMES/COLOGNE OR DEODORANT.  DO NOT SHAVE FOR 24 HOURS PRIOR TO DAY OF SURGERY.  CONTACTS, GLASSES, OR DENTURES MAY NOT  BE WORN TO SURGERY.  [facility] IS NOT RESPONSIBLE  FOR ANY BELONGINGS.                                                   Please read over the following fact sheets you were given: IF YOU HAVE QUESTIONS ABOUT YOUR PRE OP     INSTRUCTIONS PLEASE CALL [phone]   [facility] - Preparing for Surgery Before surgery, you can play an important role.  Because skin is not sterile, your skin needs to be as free of germs as possible.  You can reduce the number of germs on your skin by washing with CHG (chlorahexidine gluconate) soap before surgery.  CHG is an antiseptic cleaner which kills germs and bonds with the skin to continue killing germs even after washing. Please DO NOT use if you have an allergy to CHG or antibacterial soaps.  If your skin becomes reddened/irritated stop using the CHG and inform your nurse when you arrive at Short Stay. Do not shave (including legs and underarms) for at least 48 hours prior to the first CHG shower.  You may shave your face/neck.  Please follow these instructions carefully:  1.  Shower with CHG Soap the night before surgery and the  morning of surgery.  2.  If you choose to wash your hair, wash your hair first as usual with your normal  shampoo.  3.  After you shampoo, rinse your hair and body thoroughly to remove the shampoo.                             4.  Use CHG as you would any other liquid soap.  You can apply chg directly to the skin and wash.  Gently with a scrungie or clean washcloth.  5.  Apply the CHG Soap to your body ONLY FROM THE NECK DOWN.   Do   not use on face/ open                           Wound or open sores. Avoid contact with eyes, ears mouth and   genitals (private parts).                       Wash face,  Genitals (private parts) with your normal soap.             6.  Wash thoroughly, paying special attention to the area where your    surgery  will be performed.  7.  Thoroughly rinse your body with  warm water from the neck down.  8.  DO NOT shower/wash with your normal soap after using and rinsing off the CHG Soap.                9.  Pat yourself dry with a clean towel.            10.  Wear clean pajamas.            11.  Place clean sheets on your bed the night of your first shower and do not  sleep with pets. Day of Surgery : Do not apply any lotions/deodorants the morning of surgery.  Please wear clean clothes to the hospital/surgery center.  FAILURE TO FOLLOW THESE INSTRUCTIONS MAY  RESULT IN THE CANCELLATION OF YOUR SURGERY  PATIENT SIGNATURE_________________________________  NURSE SIGNATURE__________________________________  ________________________________________________________________________   Adam Phenix  An incentive spirometer is a tool that can help keep your lungs clear and active. This tool measures how well you are filling your lungs with each breath. Taking long deep breaths may help reverse or decrease the chance of developing breathing (pulmonary) problems (especially infection) following:  A long period of time when you are unable to move or be active. BEFORE THE PROCEDURE   If the spirometer includes an indicator to show your best effort, your nurse or respiratory therapist will set it to a desired goal.  If possible, sit up straight or lean slightly forward. Try not to slouch.  Hold the incentive spirometer in an upright position. INSTRUCTIONS FOR USE  1. Sit on the edge of your bed if possible, or sit up as far as you can in bed or on a chair. 2. Hold the incentive spirometer in an upright position. 3. Breathe out normally. 4. Place the mouthpiece in your mouth and seal your lips tightly around it. 5. Breathe in slowly and as deeply as possible, raising the piston or the ball toward the top of the column. 6. Hold your breath for 3-5 seconds or for as long as possible. Allow the piston or ball to fall to the bottom of the column. 7. Remove the  mouthpiece from your mouth and breathe out normally. 8. Rest for a few seconds and repeat Steps 1 through 7 at least 10 times every 1-2 hours when you are awake. Take your time and take a few normal breaths between deep breaths. 9. The spirometer may include an indicator to show your best effort. Use the indicator as a goal to work toward during each repetition. 10. After each set of 10 deep breaths, practice coughing to be sure your lungs are clear. If you have an incision (the cut made at the time of surgery), support your incision when coughing by placing a pillow or rolled up towels firmly against it. Once you are able to get out of bed, walk around indoors and cough well. You may stop using the incentive spirometer when instructed by your caregiver.  RISKS AND COMPLICATIONS  Take your time so you do not get dizzy or light-headed.  If you are in pain, you may need to take or ask for pain medication before doing incentive spirometry. It is harder to take a deep breath if you are having pain. AFTER USE  Rest and breathe slowly and easily.  It can be helpful to keep track of a log of your progress. Your caregiver can provide you with a simple table to help with this. If you are using the spirometer at home, follow these instructions: [place] IF:   You are having difficultly using the spirometer.  You have trouble using the spirometer as often as instructed.  Your pain medication is not giving enough relief while using the spirometer.  You develop fever of 100.5 F (38.1 C) or higher. SEEK IMMEDIATE MEDICAL CARE IF:   You cough up bloody sputum that had not been present before.  You develop fever of 102 F (38.9 C) or greater.  You develop worsening pain at or near the incision site. MAKE SURE YOU:   Understand these instructions.  Will watch your condition.  Will get help right away if you are not doing well or get worse. Document Released: 07/25/2006 Document  Revised: 06/06/2011 Document Reviewed:  09/25/2006 ExitCare Patient Information 2014 ExitCare, [place].   ________________________________________________________________________  WHAT IS A BLOOD TRANSFUSION? Blood Transfusion Information  A transfusion is the replacement of blood or some of its parts. Blood is made up of multiple cells which provide different functions.  Red blood cells carry oxygen and are used for blood loss replacement.  White blood cells fight against infection.  Platelets control bleeding.  Plasma helps clot blood.  Other blood products are available for specialized needs, such as hemophilia or other clotting disorders. BEFORE THE TRANSFUSION  Who gives blood for transfusions?   Healthy volunteers who are fully evaluated to make sure their blood is safe. This is blood bank blood. Transfusion therapy is the safest it has ever been in the practice of medicine. Before blood is taken from a donor, a complete history is taken to make sure that person has no history of diseases nor engages in risky social behavior (examples are intravenous drug use or sexual activity with multiple partners). The donor's travel history is screened to minimize risk of transmitting infections, such as malaria. The donated blood is tested for signs of infectious diseases, such as HIV and hepatitis. The blood is then tested to be sure it is compatible with you in order to minimize the chance of a transfusion reaction. If you or a relative donates blood, this is often done in anticipation of surgery and is not appropriate for emergency situations. It takes many days to process the donated blood. RISKS AND COMPLICATIONS Although transfusion therapy is very safe and saves many lives, the main dangers of transfusion include:   Getting an infectious disease.  Developing a transfusion reaction. This is an allergic reaction to something in the blood you were given. Every precaution is taken to prevent  this. The decision to have a blood transfusion has been considered carefully by your caregiver before blood is given. Blood is not given unless the benefits outweigh the risks. AFTER THE TRANSFUSION  Right after receiving a blood transfusion, you will usually feel much better and more energetic. This is especially true if your red blood cells have gotten low (anemic). The transfusion raises the level of the red blood cells which carry oxygen, and this usually causes an energy increase.  The nurse administering the transfusion will monitor you carefully for complications. HOME CARE INSTRUCTIONS  No special instructions are needed after a transfusion. You may find your energy is better. Speak with your caregiver about any limitations on activity for underlying diseases you may have. SEEK MEDICAL CARE IF:   Your condition is not improving after your transfusion.  You develop redness or irritation at the intravenous (IV) site. SEEK IMMEDIATE MEDICAL CARE IF:  Any of the following symptoms occur over the next 12 hours:  Shaking chills.  You have a temperature by mouth above 102 F (38.9 C), not controlled by medicine.  Chest, back, or muscle pain.  People around you feel you are not acting correctly or are confused.  Shortness of breath or difficulty breathing.  Dizziness and fainting.  You get a rash or develop hives.  You have a decrease in urine output.  Your urine turns a dark color or changes to pink, red, or brown. Any of the following symptoms occur over the next 10 days:  You have a temperature by mouth above 102 F (38.9 C), not controlled by medicine.  Shortness of breath.  Weakness after normal activity.  The white part of the eye turns yellow (jaundice).  You  have a decrease in the amount of urine or are urinating less often.  Your urine turns a dark color or changes to pink, red, or brown. Document Released: 03/11/2000 Document Revised: 06/06/2011 Document  Reviewed: 10/29/2007 [facility] Patient Information 2014 [place], [place].  _______________________________________________________________________

## 2019-12-17 NOTE — Progress Notes (Addendum)
COVID Vaccine Completed: x2 Date COVID Vaccine completed: 06-24-19 & 07-11-19 COVID vaccine manufacturer: [place]   PCP - Juluis Mire, NP Cardiologist -   Chest x-ray -  EKG -  Stress Test -  ECHO -  Cardiac Cath -  Pacemaker/ICD device last checked:  Sleep Study -  CPAP -   Fasting Blood Sugar -  Checks Blood Sugar _____ times a day  Blood Thinner Instructions: Aspirin Instructions: Last Dose:  Anesthesia review:   Patient denies shortness of breath, fever, cough and chest pain at PAT appointment   Patient verbalized understanding of instructions that were given to them at the PAT appointment. Patient was also instructed that they will need to review over the PAT instructions again at home before surgery.

## 2019-12-18 ENCOUNTER — Encounter (HOSPITAL_COMMUNITY)
Admission: RE | Admit: 2019-12-18 | Discharge: 2019-12-18 | Disposition: A | Discharge status: Home / Self Care | Payer: Medicaid Other | Source: Ambulatory Visit | Attending: Obstetrics & Gynecology | Admitting: Obstetrics & Gynecology

## 2019-12-18 ENCOUNTER — Encounter: Payer: Self-pay | Admitting: Obstetrics & Gynecology

## 2019-12-18 ENCOUNTER — Other Ambulatory Visit: Payer: Self-pay

## 2019-12-18 ENCOUNTER — Encounter (HOSPITAL_COMMUNITY): Payer: Self-pay

## 2019-12-18 DIAGNOSIS — Z01812 Encounter for preprocedural laboratory examination: Secondary | ICD-10-CM | POA: Insufficient documentation

## 2019-12-18 HISTORY — DX: Anemia, unspecified: D64.9

## 2019-12-18 LAB — CBC
HCT: 27.6 % — ABNORMAL LOW (ref 36.0–46.0)
Hemoglobin: 8.3 g/dL — ABNORMAL LOW (ref 12.0–15.0)
MCH: 20.8 pg — ABNORMAL LOW (ref 26.0–34.0)
MCHC: 30.1 g/dL (ref 30.0–36.0)
MCV: 69 fL — ABNORMAL LOW (ref 80.0–100.0)
Platelets: 385 10*3/uL (ref 150–400)
RBC: 4 MIL/uL (ref 3.87–5.11)
RDW: 17.8 % — ABNORMAL HIGH (ref 11.5–15.5)
WBC: 9.1 10*3/uL (ref 4.0–10.5)
nRBC: 0 % (ref 0.0–0.2)

## 2019-12-18 LAB — BASIC METABOLIC PANEL
Anion gap: 10 (ref 5–15)
BUN: 17 mg/dL (ref 6–20)
CO2: 19 mmol/L — ABNORMAL LOW (ref 22–32)
Calcium: 8.4 mg/dL — ABNORMAL LOW (ref 8.9–10.3)
Chloride: 107 mmol/L (ref 98–111)
Creatinine, Ser: 0.72 mg/dL (ref 0.44–1.00)
GFR calc Af Amer: >60 mL/min (ref >60)
GFR calc non Af Amer: >60 mL/min (ref >60)
Glucose, Bld: 81 mg/dL (ref 70–99)
Potassium: 3.5 mmol/L (ref 3.5–5.1)
Sodium: 136 mmol/L (ref 135–145)

## 2019-12-18 NOTE — Progress Notes (Signed)
CBC sent to Dr. Harolyn Rutherford for review.

## 2019-12-19 ENCOUNTER — Encounter: Payer: Self-pay | Admitting: General Practice

## 2019-12-19 DIAGNOSIS — D219 Benign neoplasm of connective and other soft tissue, unspecified: Secondary | ICD-10-CM | POA: Diagnosis present

## 2019-12-19 DIAGNOSIS — D500 Iron deficiency anemia secondary to blood loss (chronic): Secondary | ICD-10-CM | POA: Diagnosis present

## 2019-12-20 ENCOUNTER — Other Ambulatory Visit (HOSPITAL_COMMUNITY)
Admission: RE | Admit: 2019-12-20 | Discharge: 2019-12-20 | Disposition: A | Discharge status: Home / Self Care | Payer: Medicaid Other | Source: Ambulatory Visit | Attending: Obstetrics & Gynecology | Admitting: Obstetrics & Gynecology

## 2019-12-20 DIAGNOSIS — Z20822 Contact with and (suspected) exposure to covid-19: Secondary | ICD-10-CM | POA: Insufficient documentation

## 2019-12-20 DIAGNOSIS — Z01812 Encounter for preprocedural laboratory examination: Secondary | ICD-10-CM | POA: Insufficient documentation

## 2019-12-20 LAB — SARS CORONAVIRUS 2 (TAT 6-24 HRS): SARS Coronavirus 2: NEGATIVE

## 2019-12-23 ENCOUNTER — Other Ambulatory Visit: Payer: Self-pay

## 2019-12-23 ENCOUNTER — Encounter (HOSPITAL_COMMUNITY): Payer: Self-pay | Admitting: Obstetrics & Gynecology

## 2019-12-23 NOTE — Progress Notes (Addendum)
Ms Shari Berger denies chest pain or shortness of breath. Patient was tested for Covid and has been in quarantine since that time. Ms Shari Berger was seen in PAT at [facility].  Patient's blood pressure was 153/95 at that time. I check office visit with [facility] and blood pressures  130/83 and 137/82.  Hgb  At [facility] on 12/18/19 was  8.3, there is a noted from G. Nori Riis, RN   that says she sent result to Dr. Harolyn Rutherford.  [name] had a type and screen at [facility], but will require another at [facility].

## 2019-12-24 ENCOUNTER — Encounter (HOSPITAL_COMMUNITY)
Admission: RE | Disposition: A | Discharge status: Home / Self Care | Payer: Self-pay | Source: Home / Self Care | Attending: Obstetrics & Gynecology

## 2019-12-24 ENCOUNTER — Observation Stay (HOSPITAL_COMMUNITY): Payer: Medicaid Other | Admitting: Certified Registered Nurse Anesthetist

## 2019-12-24 ENCOUNTER — Inpatient Hospital Stay (HOSPITAL_COMMUNITY)
Admission: RE | Admit: 2019-12-24 | Discharge: 2019-12-26 | DRG: 742 | Disposition: A | Discharge status: Home / Self Care | Payer: Medicaid Other | Attending: Obstetrics & Gynecology | Admitting: Obstetrics & Gynecology

## 2019-12-24 ENCOUNTER — Encounter (HOSPITAL_COMMUNITY): Payer: Self-pay | Admitting: Obstetrics & Gynecology

## 2019-12-24 ENCOUNTER — Other Ambulatory Visit: Payer: Self-pay

## 2019-12-24 DIAGNOSIS — D259 Leiomyoma of uterus, unspecified: Secondary | ICD-10-CM

## 2019-12-24 DIAGNOSIS — Z8249 Family history of ischemic heart disease and other diseases of the circulatory system: Secondary | ICD-10-CM

## 2019-12-24 DIAGNOSIS — K219 Gastro-esophageal reflux disease without esophagitis: Secondary | ICD-10-CM | POA: Diagnosis present

## 2019-12-24 DIAGNOSIS — D500 Iron deficiency anemia secondary to blood loss (chronic): Secondary | ICD-10-CM

## 2019-12-24 DIAGNOSIS — D219 Benign neoplasm of connective and other soft tissue, unspecified: Secondary | ICD-10-CM | POA: Diagnosis present

## 2019-12-24 DIAGNOSIS — N939 Abnormal uterine and vaginal bleeding, unspecified: Secondary | ICD-10-CM

## 2019-12-24 DIAGNOSIS — D62 Acute posthemorrhagic anemia: Secondary | ICD-10-CM | POA: Diagnosis not present

## 2019-12-24 DIAGNOSIS — D251 Intramural leiomyoma of uterus: Principal | ICD-10-CM | POA: Diagnosis present

## 2019-12-24 DIAGNOSIS — Z90710 Acquired absence of both cervix and uterus: Secondary | ICD-10-CM | POA: Diagnosis present

## 2019-12-24 DIAGNOSIS — Z833 Family history of diabetes mellitus: Secondary | ICD-10-CM

## 2019-12-24 DIAGNOSIS — Z79899 Other long term (current) drug therapy: Secondary | ICD-10-CM

## 2019-12-24 DIAGNOSIS — N736 Female pelvic peritoneal adhesions (postinfective): Secondary | ICD-10-CM | POA: Diagnosis present

## 2019-12-24 DIAGNOSIS — R102 Pelvic and perineal pain: Secondary | ICD-10-CM | POA: Diagnosis present

## 2019-12-24 DIAGNOSIS — D252 Subserosal leiomyoma of uterus: Secondary | ICD-10-CM | POA: Diagnosis present

## 2019-12-24 HISTORY — DX: Cluster headache syndrome, unspecified, not intractable: G44.009

## 2019-12-24 HISTORY — PX: LAPAROSCOPY: SHX197

## 2019-12-24 HISTORY — PX: ABDOMINAL HYSTERECTOMY: SHX81

## 2019-12-24 LAB — PREPARE RBC (CROSSMATCH)

## 2019-12-24 LAB — POCT I-STAT, CHEM 8
BUN: 15 mg/dL (ref 6–20)
Calcium, Ion: 1.15 mmol/L (ref 1.15–1.40)
Chloride: 108 mmol/L (ref 98–111)
Creatinine, Ser: 0.7 mg/dL (ref 0.44–1.00)
Glucose, Bld: 86 mg/dL (ref 70–99)
HCT: 28 % — ABNORMAL LOW (ref 36.0–46.0)
Hemoglobin: 9.5 g/dL — ABNORMAL LOW (ref 12.0–15.0)
Potassium: 3.8 mmol/L (ref 3.5–5.1)
Sodium: 139 mmol/L (ref 135–145)
TCO2: 19 mmol/L — ABNORMAL LOW (ref 22–32)

## 2019-12-24 LAB — TYPE AND SCREEN
ABO/RH(D): O POS
Antibody Screen: NEGATIVE

## 2019-12-24 LAB — POCT PREGNANCY, URINE: Preg Test, Ur: NEGATIVE

## 2019-12-24 SURGERY — LAPAROSCOPY, DIAGNOSTIC
Anesthesia: General | Site: Uterus

## 2019-12-24 MED ORDER — DOCUSATE SODIUM 100 MG PO CAPS
100.0000 mg | ORAL_CAPSULE | Freq: Two times a day (BID) | ORAL | Status: DC
  Administered 2019-12-24 – 2019-12-26 (×4): 100 mg via ORAL
  Filled 2019-12-24 (×4): qty 1

## 2019-12-24 MED ORDER — HYDROMORPHONE HCL 1 MG/ML IJ SOLN: 0.2000 mg | INTRAMUSCULAR | Status: DC | PRN

## 2019-12-24 MED ORDER — PANTOPRAZOLE SODIUM 40 MG PO TBEC
40.0000 mg | DELAYED_RELEASE_TABLET | Freq: Every day | ORAL | Status: DC
  Administered 2019-12-24 – 2019-12-26 (×3): 40 mg via ORAL
  Filled 2019-12-24 (×3): qty 1

## 2019-12-24 MED ORDER — CHLORHEXIDINE GLUCONATE 0.12 % MT SOLN
15.0000 mL | Freq: Once | OROMUCOSAL | Status: AC
  Administered 2019-12-24: 15 mL via OROMUCOSAL
  Filled 2019-12-24: qty 15

## 2019-12-24 MED ORDER — GABAPENTIN 300 MG PO CAPS
300.0000 mg | ORAL_CAPSULE | ORAL | Status: AC
  Administered 2019-12-24: 300 mg via ORAL
  Filled 2019-12-24: qty 1

## 2019-12-24 MED ORDER — LACTATED RINGERS IV SOLN: INTRAVENOUS | Status: DC

## 2019-12-24 MED ORDER — SENNOSIDES-DOCUSATE SODIUM 8.6-50 MG PO TABS: 1.0000 | ORAL_TABLET | Freq: Every evening | ORAL | Status: DC | PRN

## 2019-12-24 MED ORDER — ONDANSETRON HCL 4 MG/2ML IJ SOLN: 4.0000 mg | Freq: Four times a day (QID) | INTRAMUSCULAR | Status: DC | PRN

## 2019-12-24 MED ORDER — IBUPROFEN 800 MG PO TABS
800.0000 mg | ORAL_TABLET | Freq: Four times a day (QID) | ORAL | Status: DC
  Administered 2019-12-25 – 2019-12-26 (×2): 800 mg via ORAL
  Filled 2019-12-24 (×2): qty 1

## 2019-12-24 MED ORDER — ACETAMINOPHEN 500 MG PO TABS
1000.0000 mg | ORAL_TABLET | Freq: Four times a day (QID) | ORAL | Status: DC
  Administered 2019-12-24 – 2019-12-26 (×7): 1000 mg via ORAL
  Filled 2019-12-24 (×7): qty 2

## 2019-12-24 MED ORDER — DIPHENHYDRAMINE HCL 50 MG/ML IJ SOLN
INTRAMUSCULAR | Status: DC | PRN
  Administered 2019-12-24: 12.5 mg via INTRAVENOUS

## 2019-12-24 MED ORDER — PROPOFOL 10 MG/ML IV BOLUS
INTRAVENOUS | Status: AC
  Filled 2019-12-24: qty 20

## 2019-12-24 MED ORDER — SUGAMMADEX SODIUM 200 MG/2ML IV SOLN
INTRAVENOUS | Status: DC | PRN
  Administered 2019-12-24: 200 mg via INTRAVENOUS

## 2019-12-24 MED ORDER — LACTATED RINGERS IV SOLN: INTRAVENOUS | Status: DC | PRN

## 2019-12-24 MED ORDER — FENTANYL CITRATE (PF) 250 MCG/5ML IJ SOLN
INTRAMUSCULAR | Status: DC | PRN
Start: 2019-12-24 — End: 2019-12-24
  Administered 2019-12-24 (×2): 50 ug via INTRAVENOUS
  Administered 2019-12-24: 25 ug via INTRAVENOUS
  Administered 2019-12-24: 100 ug via INTRAVENOUS
  Administered 2019-12-24: 50 ug via INTRAVENOUS
  Administered 2019-12-24: 25 ug via INTRAVENOUS
  Administered 2019-12-24: 50 ug via INTRAVENOUS

## 2019-12-24 MED ORDER — ONDANSETRON HCL 4 MG PO TABS: 4.0000 mg | ORAL_TABLET | Freq: Four times a day (QID) | ORAL | Status: DC | PRN

## 2019-12-24 MED ORDER — LIDOCAINE 2% (20 MG/ML) 5 ML SYRINGE
INTRAMUSCULAR | Status: DC | PRN
  Administered 2019-12-24: 100 mg via INTRAVENOUS

## 2019-12-24 MED ORDER — OXYCODONE HCL 5 MG/5ML PO SOLN: 5.0000 mg | Freq: Once | ORAL | Status: DC | PRN

## 2019-12-24 MED ORDER — HYDROMORPHONE 1 MG/ML IV SOLN
INTRAVENOUS | Status: DC
  Administered 2019-12-24: 30 mg via INTRAVENOUS
  Administered 2019-12-24 – 2019-12-25 (×3): 0.3 mg via INTRAVENOUS
  Administered 2019-12-25: 2.1 mg via INTRAVENOUS

## 2019-12-24 MED ORDER — ONDANSETRON HCL 4 MG/2ML IJ SOLN
INTRAMUSCULAR | Status: DC | PRN
  Administered 2019-12-24: 4 mg via INTRAVENOUS

## 2019-12-24 MED ORDER — KETOROLAC TROMETHAMINE 30 MG/ML IJ SOLN
INTRAMUSCULAR | Status: DC | PRN
  Administered 2019-12-24: 30 mg via INTRAVENOUS

## 2019-12-24 MED ORDER — HYDROMORPHONE HCL 1 MG/ML IJ SOLN
INTRAMUSCULAR | Status: AC
Start: 2019-12-24 — End: 2019-12-25
  Filled 2019-12-24: qty 1

## 2019-12-24 MED ORDER — ALUM & MAG HYDROXIDE-SIMETH 200-200-20 MG/5ML PO SUSP: 30.0000 mL | ORAL | Status: DC | PRN

## 2019-12-24 MED ORDER — TRANEXAMIC ACID-NACL 1000-0.7 MG/100ML-% IV SOLN
1000.0000 mg | INTRAVENOUS | Status: AC
  Administered 2019-12-24: 1000 mg via INTRAVENOUS
  Filled 2019-12-24: qty 100

## 2019-12-24 MED ORDER — NALOXONE HCL 0.4 MG/ML IJ SOLN: 0.4000 mg | INTRAMUSCULAR | Status: DC | PRN

## 2019-12-24 MED ORDER — SODIUM CHLORIDE 0.9% FLUSH: 9.0000 mL | INTRAVENOUS | Status: DC | PRN

## 2019-12-24 MED ORDER — OXYCODONE-ACETAMINOPHEN 5-325 MG PO TABS
2.0000 | ORAL_TABLET | ORAL | Status: DC | PRN
  Administered 2019-12-26: 2 via ORAL
  Filled 2019-12-24: qty 2

## 2019-12-24 MED ORDER — DEXAMETHASONE SODIUM PHOSPHATE 10 MG/ML IJ SOLN
INTRAMUSCULAR | Status: DC | PRN
  Administered 2019-12-24: 10 mg via INTRAVENOUS

## 2019-12-24 MED ORDER — BUPIVACAINE-EPINEPHRINE 0.5% -1:200000 IJ SOLN
INTRAMUSCULAR | Status: AC
  Filled 2019-12-24: qty 1

## 2019-12-24 MED ORDER — OXYCODONE HCL 5 MG PO TABS
5.0000 mg | ORAL_TABLET | ORAL | Status: DC | PRN
  Administered 2019-12-25 – 2019-12-26 (×4): 10 mg via ORAL
  Filled 2019-12-24 (×4): qty 2

## 2019-12-24 MED ORDER — BUPIVACAINE HCL (PF) 0.5 % IJ SOLN
INTRAMUSCULAR | Status: AC
  Filled 2019-12-24: qty 60

## 2019-12-24 MED ORDER — MAGNESIUM CITRATE PO SOLN
1.0000 | Freq: Once | ORAL | Status: AC | PRN
  Administered 2019-12-26: 1 via ORAL
  Filled 2019-12-24 (×2): qty 296

## 2019-12-24 MED ORDER — ROCURONIUM BROMIDE 10 MG/ML (PF) SYRINGE
PREFILLED_SYRINGE | INTRAVENOUS | Status: DC | PRN
  Administered 2019-12-24: 100 mg via INTRAVENOUS

## 2019-12-24 MED ORDER — ACETAMINOPHEN 500 MG PO TABS
1000.0000 mg | ORAL_TABLET | ORAL | Status: AC
  Administered 2019-12-24: 1000 mg via ORAL
  Filled 2019-12-24: qty 2

## 2019-12-24 MED ORDER — DIPHENHYDRAMINE HCL 50 MG/ML IJ SOLN: 12.5000 mg | Freq: Four times a day (QID) | INTRAMUSCULAR | Status: DC | PRN

## 2019-12-24 MED ORDER — FENTANYL CITRATE (PF) 250 MCG/5ML IJ SOLN
INTRAMUSCULAR | Status: AC
Start: 2019-12-24 — End: ?
  Filled 2019-12-24: qty 5

## 2019-12-24 MED ORDER — POVIDONE-IODINE 10 % EX SWAB
2.0000 "application " | Freq: Once | CUTANEOUS | Status: AC
  Administered 2019-12-24: 2 via TOPICAL

## 2019-12-24 MED ORDER — GABAPENTIN 100 MG PO CAPS
200.0000 mg | ORAL_CAPSULE | Freq: Two times a day (BID) | ORAL | Status: DC
  Administered 2019-12-24 – 2019-12-26 (×4): 200 mg via ORAL
  Filled 2019-12-24 (×4): qty 2

## 2019-12-24 MED ORDER — KETOROLAC TROMETHAMINE 30 MG/ML IJ SOLN
30.0000 mg | Freq: Four times a day (QID) | INTRAMUSCULAR | Status: AC
  Administered 2019-12-24 – 2019-12-25 (×4): 30 mg via INTRAVENOUS
  Filled 2019-12-24 (×4): qty 1

## 2019-12-24 MED ORDER — FENTANYL CITRATE (PF) 250 MCG/5ML IJ SOLN
INTRAMUSCULAR | Status: AC
  Filled 2019-12-24: qty 5

## 2019-12-24 MED ORDER — KETOROLAC TROMETHAMINE 30 MG/ML IJ SOLN
INTRAMUSCULAR | Status: AC
  Filled 2019-12-24: qty 2

## 2019-12-24 MED ORDER — MIDAZOLAM HCL 2 MG/2ML IJ SOLN
INTRAMUSCULAR | Status: AC
  Filled 2019-12-24: qty 2

## 2019-12-24 MED ORDER — BUPIVACAINE HCL (PF) 0.5 % IJ SOLN
INTRAMUSCULAR | Status: DC | PRN
  Administered 2019-12-24: 30 mL

## 2019-12-24 MED ORDER — PROPOFOL 10 MG/ML IV BOLUS
INTRAVENOUS | Status: DC | PRN
  Administered 2019-12-24: 100 mg via INTRAVENOUS

## 2019-12-24 MED ORDER — ZOLPIDEM TARTRATE 5 MG PO TABS: 5.0000 mg | ORAL_TABLET | Freq: Every evening | ORAL | Status: DC | PRN

## 2019-12-24 MED ORDER — SODIUM CHLORIDE 0.9 % IV SOLN
2.0000 g | INTRAVENOUS | Status: AC
  Administered 2019-12-24: 2 g via INTRAVENOUS
  Filled 2019-12-24: qty 2

## 2019-12-24 MED ORDER — OXYCODONE HCL 5 MG PO TABS: 5.0000 mg | ORAL_TABLET | Freq: Once | ORAL | Status: DC | PRN

## 2019-12-24 MED ORDER — 0.9 % SODIUM CHLORIDE (POUR BTL) OPTIME
TOPICAL | Status: DC | PRN
  Administered 2019-12-24: 1000 mL

## 2019-12-24 MED ORDER — HYDROMORPHONE HCL 1 MG/ML IJ SOLN
0.2500 mg | INTRAMUSCULAR | Status: DC | PRN
  Administered 2019-12-24 (×2): 0.5 mg via INTRAVENOUS

## 2019-12-24 MED ORDER — HYDROMORPHONE 1 MG/ML IV SOLN
INTRAVENOUS | Status: AC
  Administered 2019-12-24: 1.2 mg
  Filled 2019-12-24: qty 30

## 2019-12-24 MED ORDER — SIMETHICONE 80 MG PO CHEW
80.0000 mg | CHEWABLE_TABLET | Freq: Four times a day (QID) | ORAL | Status: DC | PRN
  Administered 2019-12-26: 80 mg via ORAL
  Filled 2019-12-24 (×2): qty 1

## 2019-12-24 MED ORDER — ORAL CARE MOUTH RINSE: 15.0000 mL | Freq: Once | OROMUCOSAL | Status: AC

## 2019-12-24 MED ORDER — ONDANSETRON HCL 4 MG/2ML IJ SOLN: 4.0000 mg | Freq: Once | INTRAMUSCULAR | Status: DC | PRN

## 2019-12-24 MED ORDER — DIPHENHYDRAMINE HCL 12.5 MG/5ML PO ELIX
12.5000 mg | ORAL_SOLUTION | Freq: Four times a day (QID) | ORAL | Status: DC | PRN
  Filled 2019-12-24: qty 5

## 2019-12-24 MED ORDER — BISACODYL 10 MG RE SUPP: 10.0000 mg | Freq: Every day | RECTAL | Status: DC | PRN

## 2019-12-24 MED ORDER — MIDAZOLAM HCL 2 MG/2ML IJ SOLN
INTRAMUSCULAR | Status: DC | PRN
  Administered 2019-12-24: 2 mg via INTRAVENOUS

## 2019-12-24 MED ORDER — MENTHOL 3 MG MT LOZG: 1.0000 | LOZENGE | OROMUCOSAL | Status: DC | PRN

## 2019-12-24 SURGICAL SUPPLY — 81 items
APPLICATOR COTTON TIP 6 STRL (MISCELLANEOUS) ×3 IMPLANT
APPLICATOR COTTON TIP 6IN STRL (MISCELLANEOUS) ×5
BENZOIN TINCTURE PRP APPL 2/3 (GAUZE/BANDAGES/DRESSINGS) ×5 IMPLANT
CABLE HIGH FREQUENCY MONO STRZ (ELECTRODE) IMPLANT
CANISTER SUCT 3000ML PPV (MISCELLANEOUS) ×5 IMPLANT
CELLS DAT CNTRL 66122 CELL SVR (MISCELLANEOUS) IMPLANT
CLOSURE STERI-STRIP 1/2X4 (GAUZE/BANDAGES/DRESSINGS) ×1
CLSR STERI-STRIP ANTIMIC 1/2X4 (GAUZE/BANDAGES/DRESSINGS) ×4 IMPLANT
COVER BACK TABLE 60X90IN (DRAPES) ×5 IMPLANT
COVER MAYO STAND STRL (DRAPES) ×5 IMPLANT
COVER WAND RF STERILE (DRAPES) ×5 IMPLANT
DECANTER SPIKE VIAL GLASS SM (MISCELLANEOUS) ×10 IMPLANT
DERMABOND ADVANCED (GAUZE/BANDAGES/DRESSINGS) ×2
DERMABOND ADVANCED .7 DNX12 (GAUZE/BANDAGES/DRESSINGS) ×3 IMPLANT
DRAPE WARM FLUID 44X44 (DRAPES) IMPLANT
DRSG OPSITE POSTOP 3X4 (GAUZE/BANDAGES/DRESSINGS) IMPLANT
DRSG OPSITE POSTOP 4X10 (GAUZE/BANDAGES/DRESSINGS) IMPLANT
DURAPREP 26ML APPLICATOR (WOUND CARE) ×5 IMPLANT
ELECT REM PT RETURN 9FT ADLT (ELECTROSURGICAL)
ELECTRODE REM PT RTRN 9FT ADLT (ELECTROSURGICAL) IMPLANT
FILTER SMOKE EVAC LAPAROSHD (FILTER) ×5 IMPLANT
GAUZE 4X4 16PLY RFD (DISPOSABLE) IMPLANT
GAUZE SPONGE 4X4 12PLY STRL LF (GAUZE/BANDAGES/DRESSINGS) ×5 IMPLANT
GLOVE BIOGEL PI IND STRL 6.5 (GLOVE) ×3 IMPLANT
GLOVE BIOGEL PI IND STRL 7.0 (GLOVE) ×15 IMPLANT
GLOVE BIOGEL PI INDICATOR 6.5 (GLOVE) ×2
GLOVE BIOGEL PI INDICATOR 7.0 (GLOVE) ×10
GLOVE ECLIPSE 7.0 STRL STRAW (GLOVE) ×10 IMPLANT
GOWN STRL REUS W/ TWL LRG LVL3 (GOWN DISPOSABLE) ×6 IMPLANT
GOWN STRL REUS W/TWL LRG LVL3 (GOWN DISPOSABLE) ×4
HEMOSTAT ARISTA ABSORB 3G PWDR (HEMOSTASIS) ×5 IMPLANT
HIBICLENS CHG 4% 4OZ BTL (MISCELLANEOUS) ×5 IMPLANT
KIT TURNOVER KIT B (KITS) ×5 IMPLANT
LEGGING LITHOTOMY PAIR STRL (DRAPES) ×5 IMPLANT
LIGASURE IMPACT 36 18CM CVD LR (INSTRUMENTS) ×5 IMPLANT
MANIPULATOR UTERINE 4.5 ZUMI (MISCELLANEOUS) ×5 IMPLANT
NEEDLE HYPO 22GX1.5 SAFETY (NEEDLE) ×5 IMPLANT
NEEDLE INSUFFLATION 14GA 120MM (NEEDLE) IMPLANT
NEEDLE MAYO CATGUT SZ4 (NEEDLE) IMPLANT
NS IRRIG 1000ML POUR BTL (IV SOLUTION) ×5 IMPLANT
PACK ABDOMINAL GYN (CUSTOM PROCEDURE TRAY) ×5 IMPLANT
PACK LAVH (CUSTOM PROCEDURE TRAY) ×5 IMPLANT
PACK ROBOTIC GOWN (GOWN DISPOSABLE) ×5 IMPLANT
PACK TRENDGUARD 450 HYBRID PRO (MISCELLANEOUS) ×3 IMPLANT
PACK TRENDGUARD 600 HYBRD PROC (MISCELLANEOUS) IMPLANT
PAD ARMBOARD 7.5X6 YLW CONV (MISCELLANEOUS) ×10 IMPLANT
PAD OB MATERNITY 4.3X12.25 (PERSONAL CARE ITEMS) ×5 IMPLANT
PROTECTOR NERVE ULNAR (MISCELLANEOUS) ×10 IMPLANT
RTRCTR C-SECT PINK 25CM LRG (MISCELLANEOUS) ×5 IMPLANT
RTRCTR WOUND ALEXIS 18CM MED (MISCELLANEOUS)
SET IRRIG TUBING LAPAROSCOPIC (IRRIGATION / IRRIGATOR) ×5 IMPLANT
SET TUBE SMOKE EVAC HIGH FLOW (TUBING) ×5 IMPLANT
SHEARS HARMONIC ACE PLUS 36CM (ENDOMECHANICALS) IMPLANT
SLEEVE ENDOPATH XCEL 5M (ENDOMECHANICALS) ×5 IMPLANT
SPECIMEN JAR MEDIUM (MISCELLANEOUS) ×5 IMPLANT
SPONGE LAP 18X18 RF (DISPOSABLE) ×10 IMPLANT
STAPLER VISISTAT 35W (STAPLE) IMPLANT
SUT PDS AB 0 CTX 60 (SUTURE) IMPLANT
SUT PLAIN 2 0 (SUTURE)
SUT PLAIN 2 0 XLH (SUTURE) ×5 IMPLANT
SUT PLAIN ABS 2-0 CT1 27XMFL (SUTURE) IMPLANT
SUT VIC AB 0 CT1 18XCR BRD8 (SUTURE) ×9 IMPLANT
SUT VIC AB 0 CT1 27 (SUTURE) ×2
SUT VIC AB 0 CT1 27XBRD ANBCTR (SUTURE) ×3 IMPLANT
SUT VIC AB 0 CT1 36 (SUTURE) ×5 IMPLANT
SUT VIC AB 0 CT1 8-18 (SUTURE) ×6
SUT VIC AB 0 CTX 36 (SUTURE) ×4
SUT VIC AB 0 CTX36XBRD ANBCTRL (SUTURE) ×6 IMPLANT
SUT VIC AB 4-0 KS 27 (SUTURE) ×5 IMPLANT
SUT VICRYL 0 TIES 12 18 (SUTURE) ×5 IMPLANT
SUT VICRYL 0 UR6 27IN ABS (SUTURE) ×5 IMPLANT
SUT VICRYL 4-0 PS2 18IN ABS (SUTURE) ×10 IMPLANT
SYR CONTROL 10ML LL (SYRINGE) ×5 IMPLANT
TOWEL GREEN STERILE FF (TOWEL DISPOSABLE) ×10 IMPLANT
TRAY FOLEY W/BAG SLVR 14FR (SET/KITS/TRAYS/PACK) ×5 IMPLANT
TRENDGUARD 450 HYBRID PRO PACK (MISCELLANEOUS) ×5
TRENDGUARD 600 HYBRID PROC PK (MISCELLANEOUS)
TROCAR XCEL NON-BLD 11X100MML (ENDOMECHANICALS) IMPLANT
TROCAR XCEL NON-BLD 5MMX100MML (ENDOMECHANICALS) ×5 IMPLANT
UNDERPAD 30X36 HEAVY ABSORB (UNDERPADS AND DIAPERS) ×5 IMPLANT
WARMER LAPAROSCOPE (MISCELLANEOUS) ×5 IMPLANT

## 2019-12-24 NOTE — Anesthesia Procedure Notes (Signed)
Procedure Name: Intubation Date/Time: 12/24/2019 1:51 PM Performed by: Clearnce Sorrel, CRNA Pre-anesthesia Checklist: Patient identified, Emergency Drugs available, Suction available, Patient being monitored and Timeout performed Patient Re-evaluated:Patient Re-evaluated prior to induction Oxygen Delivery Method: Circle system utilized Preoxygenation: Pre-oxygenation with 100% oxygen Induction Type: IV induction Ventilation: Mask ventilation without difficulty Laryngoscope Size: Mac and 3 Grade View: Grade I Tube type: Oral Tube size: 7.0 mm Number of attempts: 1 Airway Equipment and Method: Stylet Placement Confirmation: ETT inserted through vocal cords under direct vision,  positive ETCO2 and breath sounds checked- equal and bilateral Secured at: 22 cm Tube secured with: Tape Dental Injury: Teeth and Oropharynx as per pre-operative assessment

## 2019-12-24 NOTE — Transfer of Care (Signed)
Immediate Anesthesia Transfer of Care Note  Patient: Shari Berger  Procedure(s) Performed: diagnostic laparoscopy (N/A Uterus) HYSTERECTOMY ABDOMINAL (N/A Abdomen)  Patient Location: PACU  Anesthesia Type:General  Level of Consciousness: drowsy  Airway & Oxygen Therapy: Patient Spontanous Breathing and Patient connected to face mask oxygen  Post-op Assessment: Report given to RN and Post -op Vital signs reviewed and stable  Post vital signs: Reviewed and stable  Last Vitals:  Vitals Value Taken Time  BP 136/70 12/24/19 1615  Temp 36.4 C 12/24/19 1615  Pulse 88 12/24/19 1621  Resp 29 12/24/19 1621  SpO2 100 % 12/24/19 1621  Vitals shown include unvalidated device data.  Last Pain:  Vitals:   12/24/19 1615  TempSrc:   PainSc: 0-No pain         Complications: No complications documented.

## 2019-12-24 NOTE — Op Note (Addendum)
Shari Berger PROCEDURE DATE: 12/24/2019  PREOPERATIVE DIAGNOSES: Abnormal uterine bleeding, uterine fibroids, anemia due to chronic blood loss, pelvic pain. POSTOPERATIVE DIAGNOSIS: The same, pelvic adhesions PROCEDURE: Diagnostic laparoscopy, total abdominal hysterectomy, lysis of adhesions, right salpingectomy SURGEON:  Dr. Verita Schneiders ASSISTANT: Dr. Lavonia Drafts, Gaylord Shih, RNFA.  An experienced assistant was required given the standard of surgical care given the complexity of the case.  This assistant was needed for exposure, dissection, suctioning, retraction, instrument exchange, and for overall help during the procedure. ANESTHESIA:  General endotracheal.  INDICATIONS: The patient is a 44 y.o. G2P1011 with the aforementioned diagnoses who desires definitive surgical management. On the preoperative visit, the risks, benefits, indications, and alternatives of the procedure were reviewed with the patient.  On the day of surgery, the risks of surgery were again discussed with the patient including but not limited to: bleeding which may require transfusion or reoperation; infection which may require antibiotics; injury to bowel, bladder, ureters or other surrounding organs; need for additional procedures; thromboembolic phenomenon, incisional problems and other postoperative/anesthesia complications. Written informed consent was obtained.    OPERATIVE FINDINGS: A large fibroid uterus with two large fibroids on both horns.  Adhesions of both adnexa to bilateral side walls and posterior uterus.  Adhesions of posterior uterus to bowel, easily lysed.  Right fallopian tube was torsed around fibroid on right, unable to fully visualize right ovary, left fallopian tube and ovary due to adhesions.     ESTIMATED BLOOD LOSS: 150 ml SPECIMENS:  Uterus, cervix, right fallopian tube sent to pathology COMPLICATIONS:  None immediate.   DESCRIPTION OF PROCEDURE:  The patient received intravenous  antibiotics and had sequential compression devices applied to her lower extremities while in the preoperative area.   She was taken to the operating room and placed under general anesthesia without difficulty.The abdomen and perineum were prepped and draped in a sterile manner, and she was placed in a dorsal supine position. A Foley catheter was inserted into her bladder and attached to constant drainage and a uterine manipulator was then advanced into the uterus.   After an adequate timeout was performed, attention was turned to the abdomen where an umbilical incision was made with the scalpel.  The Optiview 5-mm trocar and sleeve were then advanced without difficulty with the laparoscope under direct visualization into the abdomen.  The abdomen was then insufflated with carbon dioxide gas and adequate pneumoperitoneum was obtained.   A detailed survey of the patient's pelvis and abdomen revealed the findings as mentioned above. The decision was made to proceed with abdominal hysterectomy given these findings.  No intraoperative injury to surrounding organs was noted.  The abdomen was desufflated and all instruments were then removed from the patient's abdomen. The uterine manipulator was removed without complications.  The umbilical incision was closed with Dermabond.   A Pfannensteil skin incision was made with the scalpel. This incision was taken down to the fascia using electrocautery with care given to maintain good hemostasis. The fascia was incised in the midline and the fascial incision was then extended bilaterally using electrocautery without difficulty. The fascia was then dissected off the underlying rectus muscles using blunt and sharp dissection. The rectus muscles were split bluntly in the midline and the peritoneum entered sharply without complication. This peritoneal incision was then extended superiorly and inferiorly with care given to prevent bowel or bladder injury. Attention was then turned  to the pelvis. A retractor was placed into the incision, and the bowel was packed  away with moist laparotomy sponges. The uterus at this point was grasped with tenaculums on each fibroid at each side, unable to be delivered up out of the abdomen.  The round ligaments on each side were clamped, ligated with the Ligasure device allowing entry into the broad ligament.  Adhesions surrounding the adnexa were lysed using blunt methods and the Ligasure. The anterior and posterior leaves of the broad ligament were separated, and the ureters were inspected to be safely away from the area of dissection bilaterally.  Adnexae were clamped on the patient's left side and ligated with Ligasure. This procedure was repeated in an identical fashion on the right site allowing for both adnexa to remain in place. Unable to visualize the left fallopian tube given adhesions but the torsed right fallopian tube was excised using the Ligasure.  A bladder flap was then created.  The bladder was then bluntly dissected off the lower uterine segment and cervix with good hemostasis noted. The uterine arteries were then skeletonized bilaterally and then clamped, ligated with Ligasure care given to prevent ureteral injury.  The uterus was then amputated across the lower uterine segment leaving the cervix intact.  Kochers were used to grasp the cervical stump.   The uterosacral ligaments were then clamped, cut, and suture ligated bilaterally using 0 Vicryl.  For the remainder of the hysterectomy, 0 Vicryl sutures were used unless otherwise specified.  Finally, the cardinal ligaments were clamped, cut, and suture ligated bilaterally.  Acutely curved clamps were placed across the vagina just under the cervix, and the specimen was amputated and sent to pathology. The vaginal cuff angles were closed with Heaney stiches with care given to incorporate the uterosacral-cardinal ligament pedicles on both sides. The middle of the vaginal cuff was closed with  a series of interrupted figure-of-eight sutures with care given to incorporate the anterior pubocervical fascia and the posterior rectovaginal fascia.   The pelvis was irrigated and hemostasis was reconfirmed at all pedicles and along the pelvic sidewall.  The ureters were inspected and noted to be peristalsing bilaterally.  All laparotomy sponges and instruments were removed from the abdomen. The muscles were reapproximated with an interrupted stitch, and the fascia was also closed in a running fashion. The subcutaneous layer was reapproximated with 2-0 plain gut. The skin was closed with a 4-0 Monocryl subcuticular stitch. Sponge, lap, needle, and instrument counts were correct times three. The patient was taken to the recovery area awake, extubated and in stable condition.   Verita Schneiders, MD, [place] [facility] [employer], [place]

## 2019-12-24 NOTE — Progress Notes (Signed)
Orthopedic Tech Progress Note Patient Details:  Shari Berger 02/14/1976 [id number]  Ortho Devices Type of Ortho Device: Abdominal binder Ortho Device/Splint Interventions: Ordered       Tammy Sours 12/24/2019, 7:32 PM

## 2019-12-24 NOTE — H&P (Signed)
Preoperative History and Physical  Shari Berger is a 44 y.o. G2P1011 here for surgical management of abnormal uterine bleeding and uterine fibroids causing anemia due to chronic blood loss and significant pelvic pain.  Preoperative hemoglobin is 8.3.    No other significant preoperative concerns.  Proposed surgery:  Laparoscopic assisted vaginal hysterectomy with bilateral salpingectomy, possible abdominal hysterectomy.   Past Medical History:  Diagnosis Date   Acid reflux    ocassional   Anemia    Cluster headache    Fibroid    Migraines    Past Surgical History:  Procedure Laterality Date   WISDOM TOOTH EXTRACTION     OB History  Gravida Para Term Preterm AB Living  2 1 1  0 1 1  SAB TAB Ectopic Multiple Live Births      1   1    # Outcome Date GA Lbr Len/2nd Weight Sex Delivery Anes PTL Lv  2 Ectopic           1 Term      Vag-Spont     Patient denies any other pertinent gynecologic issues.   No current facility-administered medications on file prior to encounter.   Current Outpatient Medications on File Prior to Encounter  Medication Sig Dispense Refill   megestrol (MEGACE) 40 MG tablet Take 2 tablets (80 mg total) by mouth daily. Can increase to two tablets twice a day for heavy bleeding (Patient taking differently: Take 40 mg by mouth daily. Can increase to two tablets twice a day for heavy bleeding) 60 tablet 2   naproxen (NAPROSYN) 500 MG tablet Take 1 tablet (500 mg total) by mouth 2 (two) times daily with a meal. As needed for pain and bleeding (Patient taking differently: Take 500 mg by mouth daily as needed (Bleeding). ) 60 tablet 2   ibuprofen (ADVIL) 800 MG tablet Take 800 mg by mouth daily as needed for moderate pain.      No Known Allergies  Social History:   reports that she has never smoked. She has never used smokeless tobacco. She reports current alcohol use. She reports that she does not use drugs.  Family History  Problem Relation Age of Onset    Depression Mother    Heart disease Father    Diabetes Father    Hypertension Father     Review of Systems: Pertinent items noted in HPI and remainder of comprehensive ROS otherwise negative.  PHYSICAL EXAM: Blood pressure (!) 150/73, pulse 97, temperature 97.9 F (36.6 C), temperature source Oral, resp. rate 20, height 5\' 5"  (1.651 m), weight 102.1 kg, last menstrual period 12/18/2019, SpO2 100 %. CONSTITUTIONAL: Well-developed, well-nourished female in no acute distress.  HENT:  Normocephalic, atraumatic, External right and left ear normal. Oropharynx is clear and moist EYES: Conjunctivae and EOM are normal. Pupils are equal, round, and reactive to light. No scleral icterus.  NECK: Normal range of motion, supple, no masses SKIN: Skin is warm and dry. No rash noted. Not diaphoretic. No erythema. No pallor. NEUROLOGIC: Alert and oriented to person, place, and time. Normal reflexes, muscle tone coordination. No cranial nerve deficit noted. PSYCHIATRIC: Normal mood and affect. Normal behavior. Normal judgment and thought content. CARDIOVASCULAR: Normal heart rate noted, regular rhythm RESPIRATORY: Effort and breath sounds normal, no problems with respiration noted ABDOMEN: Soft, nontender, nondistended. PELVIC: Deferred MUSCULOSKELETAL: Normal range of motion. No edema and no tenderness. 2+ distal pulses.  Labs: Results for orders placed or performed during the hospital encounter of 12/24/19 (from the  past 336 hour(s))  Pregnancy, urine POC   Collection Time: 12/24/19 11:12 AM  Result Value Ref Range   Preg Test, Ur NEGATIVE NEGATIVE  Type and screen   Collection Time: 12/24/19 11:25 AM  Result Value Ref Range   ABO/RH(D) O POS    Antibody Screen NEG    Sample Expiration 12/27/2019,2359    Unit Number [id number]    Blood Component Type RED CELLS,LR    Unit division 00    Status of Unit ALLOCATED    Transfusion Status OK TO TRANSFUSE    Crossmatch Result       Compatible Performed at [facility], [place] [address] [place], [place] [address]    Unit Number [id number]    Blood Component Type RED CELLS,LR    Unit division 00    Status of Unit ALLOCATED    Transfusion Status OK TO TRANSFUSE    Crossmatch Result Compatible   BPAM RBC   Collection Time: 12/24/19 11:25 AM  Result Value Ref Range   ISSUE DATE / TIME [id number]    Blood Product Unit Number [id number]    PRODUCT CODE C6237S28    Unit Type and Rh 5100    Blood Product Expiration Date 315176160737    ISSUE DATE / TIME [id number]    Blood Product Unit Number [id number]    PRODUCT CODE H3716R67    Unit Type and Rh 5100    Blood Product Expiration Date 893810175102   Ginger Carne 8   Collection Time: 12/24/19 11:35 AM  Result Value Ref Range   Sodium 139 135 - 145 mmol/L   Potassium 3.8 3.5 - 5.1 mmol/L   Chloride 108 98 - 111 mmol/L   BUN 15 6 - 20 mg/dL   Creatinine, Ser 0.70 0.44 - 1.00 mg/dL   Glucose, Bld 86 70 - 99 mg/dL   Calcium, Ion 1.15 1.15 - 1.40 mmol/L   TCO2 19 (L) 22 - 32 mmol/L   Hemoglobin 9.5 (L) 12.0 - 15.0 g/dL   HCT 28.0 (L) 36 - 46 %  Results for orders placed or performed during the hospital encounter of 12/20/19 (from the past 336 hour(s))  SARS CORONAVIRUS 2 (TAT 6-24 HRS) Nasopharyngeal Nasopharyngeal Swab   Collection Time: 12/20/19  2:10 PM   Specimen: Nasopharyngeal Swab  Result Value Ref Range   SARS Coronavirus 2 NEGATIVE NEGATIVE  Results for orders placed or performed during the hospital encounter of 12/18/19 (from the past 336 hour(s))  Basic metabolic panel   Collection Time: 12/18/19  2:22 PM  Result Value Ref Range   Sodium 136 135 - 145 mmol/L   Potassium 3.5 3.5 - 5.1 mmol/L   Chloride 107 98 - 111 mmol/L   CO2 19 (L) 22 - 32 mmol/L   Glucose, Bld 81 70 - 99 mg/dL   BUN 17 6 - 20 mg/dL   Creatinine, Ser 0.72 0.44 - 1.00 mg/dL   Calcium 8.4 (L) 8.9 - 10.3 mg/dL   GFR calc non Af Amer >60 >60 mL/min   GFR calc Af  Amer >60 >60 mL/min   Anion gap 10 5 - 15  CBC   Collection Time: 12/18/19  2:22 PM  Result Value Ref Range   WBC 9.1 4.0 - 10.5 K/uL   RBC 4.00 3.87 - 5.11 MIL/uL   Hemoglobin 8.3 (L) 12.0 - 15.0 g/dL   HCT 27.6 (L) 36 - 46 %   MCV 69.0 (L) 80.0 - 100.0 fL   MCH 20.8 (  L) 26.0 - 34.0 pg   MCHC 30.1 30.0 - 36.0 g/dL   RDW 17.8 (H) 11.5 - 15.5 %   Platelets 385 150 - 400 K/uL   nRBC 0.0 0.0 - 0.2 %  Type and screen [facility]   Collection Time: 12/18/19  2:22 PM  Result Value Ref Range   ABO/RH(D) O POS    Antibody Screen NEG    Sample Expiration 12/27/2019,2359    Extend sample reason      NO TRANSFUSIONS OR PREGNANCY IN THE PAST 3 MONTHS Performed at [facility], [place] [address] [place], [place] [address]     Imaging Studies: US PELVIC COMPLETE WITH TRANSVAGINAL  Result Date: 12/10/2019 CLINICAL DATA:  Abnormal uterine bleeding, uterine fibroids, anemia, bleeding since 08/02/2019 follow-up EXAM: TRANSABDOMINAL AND TRANSVAGINAL ULTRASOUND OF PELVIS TECHNIQUE: Both transabdominal and transvaginal ultrasound examinations of the pelvis were performed. Transabdominal technique was performed for global imaging of the pelvis including uterus, ovaries, adnexal regions, and pelvic cul-de-sac. It was necessary to proceed with endovaginal exam following the transabdominal exam to visualize the endometrium and ovaries. COMPARISON:  02/19/2019 FINDINGS: Uterus Measurements: 13.2 x 6.1 x 11.7 cm = volume: 491 mL. Anteverted. Heterogeneous myometrium. Multiple uterine masses likely representing leiomyomata. Largest of these measure 4.6 x 4.3 x 3.5 cm at anterior LEFT uterus, 5.9 x 5.1 x 6.2 cm at RIGHT fundus, and 4.0 x 3.5 x 4.1 cm at posterior RIGHT uterus. Several of the observed leiomyomata extend submucosal. Endometrium Thickness: 10 mm. Partially obscured at upper uterine segment. No endometrial fluid. Right ovary Measurements: 3.5 x 1.9 x 2.2 cm = volume: 7.2  mL. Normal morphology without mass Left ovary Measurements: 3.9 x 2.0 x 3.1 cm = volume: 12.3 mL. Normal morphology without mass Other findings No free pelvic fluid.  No adnexal masses. IMPRESSION: Enlarged uterus containing multiple leiomyomata, some of which extend submucosal. Unremarkable endometrial complex and ovaries. Electronically Signed   By: Lavonia Dana M.D.   On: 12/10/2019 17:25    Assessment: Principal Problem:   Abnormal uterine bleeding (AUB) Active Problems:   Pelvic pain   Anemia due to chronic blood loss   Fibroids   S/P hysterectomy   Plan: Patient will undergo surgical management with laparoscopic assisted vaginal hysterectomy with bilateral salpingectomy, possible abdominal hysterectomy.   The risks of surgery were discussed in detail with the patient including but not limited to: bleeding which may require transfusion or reoperation; infection which may require antibiotics; injury to surrounding organs which may involve bowel, bladder, ureters; need for additional procedures including laparotomy or subsequent procedures secondary to abnormal pathology; thromboembolic phenomenon, surgical site problems and other postoperative/anesthesia complications.  Likelihood of success in alleviating the patient's condition was discussed.  Routine postoperative instructions will be reviewed with the patient and her family in detail after surgery.  The patient concurred with the proposed plan, giving informed written consent for the surgery.  Patient has been NPO since last night and she will remain NPO for procedure.  Anesthesia and OR aware.  Preoperative prophylactic antibiotics and SCDs ordered on call to the OR.  She is also typed and crossmatched for two units of packed RBCs and will get preoperative TXA to prevent blood loss.  To OR when ready.    Verita Schneiders, MD, [place] for [employer], [place]

## 2019-12-24 NOTE — Anesthesia Preprocedure Evaluation (Addendum)
Anesthesia Evaluation  Patient identified by MRN, date of birth, ID band Patient awake    Reviewed: Allergy & Precautions, NPO status , Patient's Chart, lab work & pertinent test results  Airway Mallampati: I  TM Distance: >3 FB Neck ROM: Full    Dental  (+) Teeth Intact   Pulmonary    Pulmonary exam normal        Cardiovascular Normal cardiovascular exam     Neuro/Psych    GI/Hepatic GERD  Medicated and Controlled,  Endo/Other    Renal/GU      Musculoskeletal   Abdominal (+)  Abdomen: soft. Bowel sounds: normal.  Peds  Hematology   Anesthesia Other Findings   Reproductive/Obstetrics                            Anesthesia Physical Anesthesia Plan  ASA: II  Anesthesia Plan: General   Post-op Pain Management:    Induction: Intravenous  PONV Risk Score and Plan: 3 and Midazolam, Ondansetron and Dexamethasone  Airway Management Planned: Oral ETT  Additional Equipment:   Intra-op Plan:   Post-operative Plan: Extubation in OR  Informed Consent: I have reviewed the patients History and Physical, chart, labs and discussed the procedure including the risks, benefits and alternatives for the proposed anesthesia with the patient or authorized representative who has indicated his/her understanding and acceptance.       Plan Discussed with: CRNA and Surgeon  Anesthesia Plan Comments:         Anesthesia Quick Evaluation

## 2019-12-25 ENCOUNTER — Encounter (HOSPITAL_COMMUNITY): Payer: Self-pay | Admitting: Obstetrics & Gynecology

## 2019-12-25 DIAGNOSIS — D252 Subserosal leiomyoma of uterus: Secondary | ICD-10-CM | POA: Diagnosis present

## 2019-12-25 DIAGNOSIS — N736 Female pelvic peritoneal adhesions (postinfective): Secondary | ICD-10-CM | POA: Diagnosis present

## 2019-12-25 DIAGNOSIS — D259 Leiomyoma of uterus, unspecified: Secondary | ICD-10-CM | POA: Diagnosis present

## 2019-12-25 DIAGNOSIS — Z79899 Other long term (current) drug therapy: Secondary | ICD-10-CM | POA: Diagnosis not present

## 2019-12-25 DIAGNOSIS — D251 Intramural leiomyoma of uterus: Secondary | ICD-10-CM | POA: Diagnosis present

## 2019-12-25 DIAGNOSIS — D62 Acute posthemorrhagic anemia: Secondary | ICD-10-CM | POA: Diagnosis not present

## 2019-12-25 DIAGNOSIS — Z833 Family history of diabetes mellitus: Secondary | ICD-10-CM | POA: Diagnosis not present

## 2019-12-25 DIAGNOSIS — Z8249 Family history of ischemic heart disease and other diseases of the circulatory system: Secondary | ICD-10-CM | POA: Diagnosis not present

## 2019-12-25 DIAGNOSIS — K219 Gastro-esophageal reflux disease without esophagitis: Secondary | ICD-10-CM | POA: Diagnosis present

## 2019-12-25 LAB — BASIC METABOLIC PANEL
Anion gap: 11 (ref 5–15)
BUN: 11 mg/dL (ref 6–20)
CO2: 20 mmol/L — ABNORMAL LOW (ref 22–32)
Calcium: 8.3 mg/dL — ABNORMAL LOW (ref 8.9–10.3)
Chloride: 105 mmol/L (ref 98–111)
Creatinine, Ser: 0.91 mg/dL (ref 0.44–1.00)
GFR calc Af Amer: >60 mL/min (ref >60)
GFR calc non Af Amer: >60 mL/min (ref >60)
Glucose, Bld: 139 mg/dL — ABNORMAL HIGH (ref 70–99)
Potassium: 3.7 mmol/L (ref 3.5–5.1)
Sodium: 136 mmol/L (ref 135–145)

## 2019-12-25 LAB — CBC
HCT: 25.6 % — ABNORMAL LOW (ref 36.0–46.0)
Hemoglobin: 7.5 g/dL — ABNORMAL LOW (ref 12.0–15.0)
MCH: 19.8 pg — ABNORMAL LOW (ref 26.0–34.0)
MCHC: 29.3 g/dL — ABNORMAL LOW (ref 30.0–36.0)
MCV: 67.5 fL — ABNORMAL LOW (ref 80.0–100.0)
Platelets: 390 10*3/uL (ref 150–400)
RBC: 3.79 MIL/uL — ABNORMAL LOW (ref 3.87–5.11)
RDW: 17.2 % — ABNORMAL HIGH (ref 11.5–15.5)
WBC: 17 10*3/uL — ABNORMAL HIGH (ref 4.0–10.5)
nRBC: 0 % (ref 0.0–0.2)

## 2019-12-25 MED ORDER — SODIUM CHLORIDE 0.9 % IV SOLN
510.0000 mg | Freq: Once | INTRAVENOUS | Status: AC
  Administered 2019-12-25: 510 mg via INTRAVENOUS
  Filled 2019-12-25: qty 17

## 2019-12-25 NOTE — Anesthesia Postprocedure Evaluation (Signed)
Anesthesia Post Note  Patient: Shari Berger  Procedure(s) Performed: diagnostic laparoscopy (N/A Uterus) HYSTERECTOMY ABDOMINAL (N/A Abdomen)     Patient location during evaluation: PACU Anesthesia Type: General Level of consciousness: awake and alert Pain management: pain level controlled Vital Signs Assessment: post-procedure vital signs reviewed and stable Respiratory status: spontaneous breathing, nonlabored ventilation, respiratory function stable and patient connected to nasal cannula oxygen Cardiovascular status: blood pressure returned to baseline and stable Postop Assessment: no apparent nausea or vomiting Anesthetic complications: no   No complications documented.  Last Vitals:  Vitals:   12/25/19 0413 12/25/19 0758  BP: (!) 147/82 125/67  Pulse: 88 87  Resp: 16 17  Temp: 36.7 C 36.8 C  SpO2: 100% 100%    Last Pain:  Vitals:   12/25/19 0758  TempSrc: Oral  PainSc:                  March Rummage Aneita Kiger

## 2019-12-25 NOTE — Progress Notes (Signed)
Orthopedic Tech Progress Note Patient Details:  Shari Berger 1976/02/27 [id number] Was called by RN to come an apply an ABDOMINAL BINDER to patient. One patient had on was a XL and was way to big. Ortho Devices Type of Ortho Device: Abdominal binder Ortho Device/Splint Location: stomach Ortho Device/Splint Interventions: Application, Adjustment   Post Interventions Patient Tolerated: Well Instructions Provided: Care of device   Janit Pagan 12/25/2019, 1:02 PM

## 2019-12-25 NOTE — Progress Notes (Signed)
1 Day Post-Op Procedure(s) (LRB): Diagnostic laparoscopy (N/A) HYSTERECTOMY ABDOMINAL (N/A)  Subjective: Patient reports incisional pain and tolerating PO.  Foley was just discontinued. Has been ambulating since last night.  Objective: I have reviewed patient's vital signs, intake and output, medications and labs. Blood pressure 125/67, pulse 87, temperature 98.3 F (36.8 C), temperature source Oral, resp. rate 18, height 5\' 5"  (1.651 m), weight 102.1 kg, last menstrual period 12/18/2019, SpO2 100 %.   Intake/Output Summary (Last 24 hours) at 12/25/2019 1030 Last data filed at 12/25/2019 0930 Gross per 24 hour  Intake 1230 ml  Output 1500 ml  Net -270 ml   General: alert and no distress Resp: normal breath sounds, normal effort Cardio: regular rate and rhythm GI: soft, mild TTP, dressing in place C/D/I, no drainage Extremities: extremities normal, atraumatic, no cyanosis or edema and Homans sign is negative, no sign of DVT Vaginal Bleeding: minimal reported  Pertinent Labs: Postop hemoglobin 7.5 (from preoperative 8.3) Postop creatinine 0.9 (from preoperative 0.72)  Assessment: s/p Procedure(s): diagnostic laparoscopy (N/A) HYSTERECTOMY ABDOMINAL (N/A): stable and progressing well Postoperative anemia  Plan: Advance diet Encourage ambulation Advance to PO medication  Counseled about anemia, offered transfusion vs IV iron infusion. Risks and benefits discussed. She desires iron infusion, Feraheme ordered. Hopeful for discharge tomorrow   LOS: 1 day   Verita Schneiders, MD 12/25/2019, 10:22 AM

## 2019-12-26 LAB — SURGICAL PATHOLOGY

## 2019-12-26 MED ORDER — OXYCODONE HCL 5 MG PO TABS
5.0000 mg | ORAL_TABLET | ORAL | 0 refills | Status: DC | PRN
Start: 2019-12-26 — End: 2020-06-10

## 2019-12-26 MED ORDER — ACETAMINOPHEN 500 MG PO TABS: 1000.0000 mg | ORAL_TABLET | Freq: Four times a day (QID) | ORAL | 2 refills | Status: AC

## 2019-12-26 MED ORDER — DOCUSATE SODIUM 100 MG PO CAPS
100.0000 mg | ORAL_CAPSULE | Freq: Two times a day (BID) | ORAL | 0 refills | Status: DC
Start: 2019-12-26 — End: 2020-01-08

## 2019-12-26 MED ORDER — IBUPROFEN 800 MG PO TABS: 800.0000 mg | ORAL_TABLET | Freq: Three times a day (TID) | ORAL | 2 refills | Status: DC | PRN

## 2019-12-26 NOTE — Progress Notes (Signed)
Patient is discharged from room 3C07 at this time. Alert and in stable condition. IV site d/c'd and instructions read to patient with understanding verbalized. Left unit via wheelchair with all belongings at side. 

## 2019-12-26 NOTE — Discharge Instructions (Signed)
Abdominal Hysterectomy, Care After This sheet gives you information about how to care for yourself after your procedure. Your health care provider may also give you more specific instructions. If you have problems or questions, contact your health care provider. What can I expect after the procedure? After your procedure, it is common to have:  Pain.  Fatigue.  Poor appetite.  Less interest in sex.  Vaginal bleeding and discharge. You may need to use a sanitary napkin after this procedure. Follow these instructions at home: Bathing  Do not take baths, swim, or use a hot tub until your health care provider approves. Ask your health care provider if you can take showers. You may only be allowed to take sponge baths for bathing.  Keep the bandage (dressing) dry until your health care provider says it can be removed. Incision care   Follow instructions from your health care provider about how to take care of your incision. Make sure you: ? Wash your hands with soap and water before you change your bandage (dressing). If soap and water are not available, use hand sanitizer. ? Change your dressing as told by your health care provider. ? Leave stitches (sutures), skin glue, or adhesive strips in place. These skin closures may need to stay in place for 2 weeks or longer. If adhesive strip edges start to loosen and curl up, you may trim the loose edges. Do not remove adhesive strips completely unless your health care provider tells you to do that.  Check your incision area every day for signs of infection. Check for: ? Redness, swelling, or pain. ? Fluid or blood. ? Warmth. ? Pus or a bad smell. Activity  Do gentle, daily exercises as told by your health care provider. You may be told to take short walks every day and go farther each time.  Do not lift anything that is heavier than 10 lb (4.5 kg), or the limit that your health care provider tells you, until he or she says that it is  safe.  Do not drive or use heavy machinery while taking prescription pain medicine.  Do not drive for 24 hours if you were given a medicine to help you relax (sedative).  Follow your health care provider's instructions about exercise, driving, and general activities. Ask your health care provider what activities are safe for you. Lifestyle  Do not douche, use tampons, or have sex for at least 6 weeks or as told by your health care provider.  Do not drink alcohol until your health care provider approves.  Drink enough fluid to keep your urine clear or pale yellow.  Try to have someone at home with you for the first 1-2 weeks to help.  Do not use any products that contain nicotine or tobacco, such as cigarettes and e-cigarettes. These can delay healing. If you need help quitting, ask your health care provider. General instructions  Take over-the-counter and prescription medicines only as told by your health care provider.  Do not take aspirin or ibuprofen. These medicines can cause bleeding.  To prevent or treat constipation while you are taking prescription pain medicine, your health care provider may recommend that you: ? Drink enough fluid to keep your urine clear or pale yellow. ? Take over-the-counter or prescription medicines. ? Eat foods that are high in fiber, such as fresh fruits and vegetables, whole grains, and beans. ? Limit foods that are high in fat and processed sugars, such as fried and sweet foods.  Keep all   follow-up visits as told by your health care provider. This is important. Contact a health care provider if:  You have chills or fever.  You have redness, swelling, or pain around your incision.  You have fluid or blood coming from your incision.  Your incision feels warm to the touch.  You have pus or a bad smell coming from your incision.  Your incision breaks open.  You feel dizzy or light-headed.  You have pain or bleeding when you urinate.  You  have persistent diarrhea.  You have persistent nausea and vomiting.  You have abnormal vaginal discharge.  You have a rash.  You have any type of abnormal reaction or you develop an allergy to your medicine.  Your pain medicine does not help. Get help right away if:  You have a fever and your symptoms suddenly get worse.  You have severe abdominal pain.  You have shortness of breath.  You faint.  You have pain, swelling, or redness in your leg.  You have heavy vaginal bleeding with blood clots. Summary  After your procedure, it is common to have pain, fatigue and vaginal discharge.  Do not take baths, swim, or use a hot tub until your health care provider approves. Ask your health care provider if you can take showers. You may only be allowed to take sponge baths for bathing.  Follow your health care provider's instructions about exercise, driving, and general activities. Ask your health care provider what activities are safe for you.  Do not lift anything that is heavier than 10 lb (4.5 kg), or the limit that your health care provider tells you, until he or she says that it is safe.  Try to have someone at home with you for the first 1-2 weeks to help. This information is not intended to replace advice given to you by your health care provider. Make sure you discuss any questions you have with your health care provider. Document Revised: 04/17/2018 Document Reviewed: 03/02/2016 Elsevier Patient Education  2020 Elsevier Inc.  

## 2019-12-26 NOTE — Discharge Summary (Signed)
Gynecology Physician Postoperative Discharge Summary  Patient ID: Shari Berger MRN: [id number] DOB/AGE: 11/09/1975 44 y.o.  Admit Date: 12/24/2019 Discharge Date: 12/26/2019  Preoperative Diagnoses:  Abnormal uterine bleeding, uterine fibroids, anemia due to chronic blood loss, pelvic pain.  Procedures: Procedure(s) (LRB): diagnostic laparoscopy (N/A) HYSTERECTOMY ABDOMINAL (N/A)  Hospital Course:  Shari Berger is a 44 y.o. [id number]  admitted for scheduled surgery.  She underwent the procedures as mentioned above, her operation was uncomplicated. For further details about surgery, please refer to the operative report. Patient had an uncomplicated postoperative course. By time of discharge on POD#2, her pain was controlled on oral pain medications; she was ambulating, voiding without difficulty, tolerating regular diet and passing flatus. She was deemed stable for discharge to home.   Significant Labs: CBC Latest Ref Rng & Units 12/25/2019 12/24/2019 12/18/2019  WBC 4.0 - 10.5 K/uL 17.0(H) - 9.1  Hemoglobin 12.0 - 15.0 g/dL 7.5(L) 9.5(L) 8.3(L)  Hematocrit 36 - 46 % 25.6(L) 28.0(L) 27.6(L)  Platelets 150 - 400 K/uL 390 - 385    Discharge Exam: Blood pressure 117/69, pulse 90, temperature 97.7 F (36.5 C), temperature source Oral, resp. rate 18, height 5\' 5"  (1.651 m), weight 102.1 kg, last menstrual period 12/18/2019, SpO2 100 %. General appearance: alert and no distress  Resp: clear to auscultation bilaterally  Cardio: regular rate and rhythm  GI: soft, non-tender; bowel sounds normal; no masses, no organomegaly.  Incision: C/D/I, no erythema, no drainage noted on dressing Pelvic: scant blood on pad  Extremities: extremities normal, atraumatic, no cyanosis or edema and Homans sign is negative, no sign of DVT  Discharged Condition: Stable  Disposition: Discharge disposition: 01-Home or Self Care        Allergies as of 12/26/2019   No Known Allergies     Medication List     TAKE these medications   acetaminophen 500 MG tablet Commonly known as: TYLENOL Take 2 tablets (1,000 mg total) by mouth every 6 (six) hours.   docusate sodium 100 MG capsule Commonly known as: COLACE Take 1 capsule (100 mg total) by mouth 2 (two) times daily.   ibuprofen 800 MG tablet Commonly known as: ADVIL Take 1 tablet (800 mg total) by mouth 3 (three) times daily with meals as needed for mild pain, moderate pain or cramping.   oxyCODONE 5 MG immediate release tablet Commonly known as: Oxy IR/ROXICODONE Take 1 tablet (5 mg total) by mouth every 4 (four) hours as needed for severe pain or breakthrough pain.      Future Appointments  Date Time Provider [place]  01/08/2020  2:35 PM Harolyn Rutherford Sallyanne Havers, MD [facility]  02/07/2020  9:35 AM Sayer Masini, Sallyanne Havers, MD [facility]     Total discharge time: 15 minutes   Signed:  Verita Schneiders, MD, [place] Attending [place], [facility]

## 2019-12-27 ENCOUNTER — Telehealth: Payer: Self-pay

## 2019-12-27 LAB — TYPE AND SCREEN
ABO/RH(D): O POS
Antibody Screen: NEGATIVE
Unit division: 0
Unit division: 0

## 2019-12-27 LAB — BPAM RBC
Blood Product Expiration Date: 202110262359
Blood Product Expiration Date: 202110262359
ISSUE DATE / TIME: 202109260757
ISSUE DATE / TIME: 202109260757
Unit Type and Rh: 5100
Unit Type and Rh: 5100

## 2019-12-27 NOTE — Telephone Encounter (Signed)
Transition Care Management Follow-up Telephone Call Date of discharge and from where:[facility] on 12/26/2019  Called pt unable to reach , Left message to call back . Phone nr and contact info provided

## 2019-12-30 ENCOUNTER — Telehealth: Payer: Self-pay

## 2019-12-30 NOTE — Telephone Encounter (Signed)
Transition Care Management Follow-up Telephone Call  Date of discharge and from where: 12/26/2019, [facility]   How have you been since you were released from the hospital? She said she is doing okay except for the pain but she is taking her pain medications as ordered  Any questions or concerns? She said she has been checking her wound and did not have any questions. She said here is no drainage or swelling at the surgical site and she has not had a fever. She said she was not given any instructions for wound care.  Instructed her to call the surgeon if she has questions about the surgery or wound and she said she would.   Items Reviewed:  Did the pt receive and understand the discharge instructions provided?  yes  Medications obtained and verified?  yes, she has all of her medications and did not have any questions about her med regime  Any new allergies since your discharge?  none reported   Do you have support at home?  yes , her boyfriend  No home health or DME ordered  Functional Questionnaire: (I = Independent and D = Dependent) ADLs: independent Follow up appointments reviewed:   PCP Hospital f/u appt confirmed? She said she will call to schedule after she sees the [facility] f/u appt confirmed? GYN 10/`05/2019  Are transportation arrangements needed? no  If their condition worsens, is the pt aware to call PCP or go to the Emergency Dept.?  yes  Was the patient provided with contact information for the PCP's office or ED?  she has the phone number for [facility]  Was to pt encouraged to call back with questions or concerns? yes

## 2019-12-31 ENCOUNTER — Other Ambulatory Visit: Payer: Self-pay

## 2019-12-31 ENCOUNTER — Encounter: Payer: Self-pay | Admitting: *Deleted

## 2019-12-31 ENCOUNTER — Ambulatory Visit (INDEPENDENT_AMBULATORY_CARE_PROVIDER_SITE_OTHER): Payer: Medicaid Other | Admitting: Advanced Practice Midwife

## 2019-12-31 VITALS — BP 130/78 | HR 101 | Ht 65.0 in | Wt 222.9 lb

## 2019-12-31 DIAGNOSIS — Z4889 Encounter for other specified surgical aftercare: Secondary | ICD-10-CM

## 2019-12-31 DIAGNOSIS — T8149XA Infection following a procedure, other surgical site, initial encounter: Secondary | ICD-10-CM

## 2019-12-31 DIAGNOSIS — Z9889 Other specified postprocedural states: Secondary | ICD-10-CM

## 2019-12-31 MED ORDER — SULFAMETHOXAZOLE-TRIMETHOPRIM 800-160 MG PO TABS: 1.0000 | ORAL_TABLET | Freq: Two times a day (BID) | ORAL | 0 refills | Status: AC

## 2019-12-31 NOTE — Progress Notes (Signed)
Pt presents for wound check S/P Total abdominal hysterectomy on 12/24/19.  Pt reports that she is still having a significant amount of pain especially @ Lt side of incision.  Honeycomb dressing removed and incision found to have steri strips intact. Small amount of purulent sanguinous drainage noted from Lt side on incision. Steri strips removed from the area. Marcille Buffy, CNM in to assess wound. Culture obtained.  And antibiotic to be prescribed. Cleansing of wound explained to pt and she voiced understanding. Pt will return 10/8 for re-check.

## 2019-12-31 NOTE — Progress Notes (Signed)
  Subjective:     Patient ID: Shari Berger, female   DOB: April 16, 1975, 44 y.o.   MRN: [id number]  Shari Berger is a 44 y.o. G2P1011 who is here today for a wound check. She reports that overall she is feeling well, but that the left side of her incision is painful compared to the rest of the incision.   Wound Check She was originally treated 5 to 10 days ago. The maximum temperature noted was less than 100.4 F. The temperature was taken using an oral thermometer. There has been colored discharge from the wound. There is new redness present. There is no swelling present. There is new pain present. She has no difficulty moving the affected extremity or digit.     Review of Systems  All other systems reviewed and are negative.      Objective:   Physical Exam Vitals and nursing note reviewed. Exam conducted with a chaperone present.  HENT:     Head: Normocephalic.  Cardiovascular:     Rate and Rhythm: Normal rate.  Pulmonary:     Effort: Pulmonary effort is normal.  Abdominal:     Palpations: Abdomen is soft.     Comments: pfannenstiel incision noted Steri strips still in place except for the left outer edge of the incision. Here the would is slightly open, with a small amount of surrounding erythema, and purulent drainage noted.  Culture obtained from the drainage.   Skin:    General: Skin is warm and dry.  Neurological:     Mental Status: She is alert and oriented to person, place, and time.  Psychiatric:        Mood and Affect: Mood normal.        Behavior: Behavior normal.        Assessment:     1. Encounter for post surgical wound check   2. Surgical site infection       Plan:     Culture pending RX: bactrim DS BID x  7 days Will have patient FU on Friday to ensure it is improving prior to going into the weekend.   Marcille Buffy DNP, CNM  12/31/19  2:10 PM

## 2020-01-02 LAB — WOUND CULTURE

## 2020-01-03 ENCOUNTER — Other Ambulatory Visit: Payer: Self-pay

## 2020-01-03 ENCOUNTER — Ambulatory Visit (INDEPENDENT_AMBULATORY_CARE_PROVIDER_SITE_OTHER): Payer: Medicaid Other

## 2020-01-03 VITALS — BP 122/80 | HR 84 | Temp 98.4°F | Wt 219.3 lb

## 2020-01-03 DIAGNOSIS — Z5189 Encounter for other specified aftercare: Secondary | ICD-10-CM

## 2020-01-03 NOTE — Progress Notes (Signed)
Pt here today for return wound check from visit on 12/31/19 in which pt was started on antibiotics for potential infection.  Removed guaze pad that pt has placed on infected site. Incision observed- well approximated, no odor, no drainage, no edema, and no erythema.   Notified Dr. Ilda Basset who recommends that pt continues to take antibiotic as prescribed and to continue to monitor for sx's of infection.   Notified pt provider's recommendation and to continue to allow warm, soapy water to run over incision and pat dry.  I also advised pt that she will be reevaluated at her appt scheduled on 01/08/20 and if she has any concerns prior to appt to please call the office.  Pt verbalized understanding.  Mel Almond, RN  01/03/20

## 2020-01-05 NOTE — Progress Notes (Signed)
Patient was assessed and managed by nursing staff during this encounter. I have reviewed the chart and agree with the documentation and plan. I have also made any necessary editorial changes.  Aletha Halim, MD 01/05/2020 3:25 PM

## 2020-01-08 ENCOUNTER — Encounter: Payer: Self-pay | Admitting: Obstetrics & Gynecology

## 2020-01-08 ENCOUNTER — Ambulatory Visit (INDEPENDENT_AMBULATORY_CARE_PROVIDER_SITE_OTHER): Payer: Medicaid Other | Admitting: Obstetrics & Gynecology

## 2020-01-08 ENCOUNTER — Other Ambulatory Visit: Payer: Self-pay

## 2020-01-08 VITALS — BP 99/73 | HR 104 | Wt 218.5 lb

## 2020-01-08 DIAGNOSIS — K5900 Constipation, unspecified: Secondary | ICD-10-CM

## 2020-01-08 DIAGNOSIS — Z90710 Acquired absence of both cervix and uterus: Secondary | ICD-10-CM

## 2020-01-08 DIAGNOSIS — Z4889 Encounter for other specified surgical aftercare: Secondary | ICD-10-CM

## 2020-01-08 MED ORDER — DOCUSATE SODIUM 100 MG PO CAPS: 100.0000 mg | ORAL_CAPSULE | Freq: Two times a day (BID) | ORAL | 1 refills | Status: DC

## 2020-01-08 NOTE — Patient Instructions (Signed)
Return to clinic for any scheduled appointments or for any gynecologic concerns as needed.   

## 2020-01-08 NOTE — Progress Notes (Signed)
   GYNECOLOGY POSTOPERATIVE VISIT NOTE  Subjective:     Shari Berger is a 44 y.o. female who presents to the clinic 2 weeks status post total abdominal hysterectomy and bilateral salpingectomy for abnormal uterine bleeding and fibroids. Eating a regular diet without difficulty. Bowel movements are abnormal with constipation, desires stool softeners. The patient is not having any pain.  Recently treated with antibiotics for presumed wound infection, has superficial skin opening above incision on left side which is slowly healing, no drainage currently.  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 No heavy lifting.  The following portions of the patient's history were reviewed and updated as appropriate: allergies, current medications, past family history, past medical history, past social history, past surgical history and problem list.  Review of Systems Pertinent items noted in HPI and remainder of comprehensive ROS otherwise negative.    Objective:    BP 99/73   Pulse (!) 104   Wt 218 lb 8 oz (99.1 kg)   LMP 12/18/2019   SpO2 100%   BMI 36.36 kg/m  General:  alert and no distress  Abdomen: soft, bowel sounds active, non-tender  Incision:   8 mm denuded round area immediately superior to left edge of closed incision. No erythema or drainage noted. Minimal tenderness. Silver nitrate applied to this area, covered with steristrips. Rest of incision is healing well, no drainage, no erythema, no hernia, no seroma, no swelling, no dehiscence, incision well approximated   12/24/2019  Surgical Pathology UTERUS AND CERVIX, RIGHT FALLOPIAN TUBE, HYSTERECTOMY:  - Cervix    Unremarkable.    No dysplasia or carcinoma.  - Endometrium    Inactive with progestational changes.    No hyperplasia or carcinoma.  - Myometrium    Leiomyomata.    No evidence of malignancy.  - Right Fallopian tube    Unremarkable.     No endometriosis or malignancy.    Assessment:    Doing well postoperatively. Operative findings again reviewed. Pathology report discussed.    Plan:     1. Continue any current medications, Colace prescribed for constipation. 2. Wound care discussed, will let area heal over. She will let us know of any concerns. 3. Activity restrictions: pelvic rest x 8 weeks after surgery 4. Anticipated return to work: work Quarry manager provided for 01/27/2020. 5. Follow up: 02/07/2020 for pelvic exam.     Verita Schneiders, MD, [place], [facility] for [employer], [place]

## 2020-02-06 ENCOUNTER — Encounter (HOSPITAL_COMMUNITY): Payer: Self-pay | Admitting: Psychiatry

## 2020-02-06 ENCOUNTER — Observation Stay (HOSPITAL_COMMUNITY)
Admission: EM | Admit: 2020-02-06 | Discharge: 2020-02-08 | Disposition: A | Discharge status: Home / Self Care | Payer: Medicaid Other | Attending: Surgery | Admitting: Surgery

## 2020-02-06 ENCOUNTER — Emergency Department (HOSPITAL_COMMUNITY): Payer: Medicaid Other

## 2020-02-06 ENCOUNTER — Other Ambulatory Visit: Payer: Self-pay

## 2020-02-06 DIAGNOSIS — N83201 Unspecified ovarian cyst, right side: Secondary | ICD-10-CM | POA: Diagnosis not present

## 2020-02-06 DIAGNOSIS — R1013 Epigastric pain: Secondary | ICD-10-CM | POA: Diagnosis present

## 2020-02-06 DIAGNOSIS — Z20822 Contact with and (suspected) exposure to covid-19: Secondary | ICD-10-CM | POA: Insufficient documentation

## 2020-02-06 DIAGNOSIS — K668 Other specified disorders of peritoneum: Principal | ICD-10-CM | POA: Diagnosis present

## 2020-02-06 LAB — BASIC METABOLIC PANEL
Anion gap: 10 (ref 5–15)
BUN: 15 mg/dL (ref 6–20)
CO2: 20 mmol/L — ABNORMAL LOW (ref 22–32)
Calcium: 8.6 mg/dL — ABNORMAL LOW (ref 8.9–10.3)
Chloride: 106 mmol/L (ref 98–111)
Creatinine, Ser: 0.84 mg/dL (ref 0.44–1.00)
GFR, Estimated: >60 mL/min (ref >60)
Glucose, Bld: 115 mg/dL — ABNORMAL HIGH (ref 70–99)
Potassium: 3.4 mmol/L — ABNORMAL LOW (ref 3.5–5.1)
Sodium: 136 mmol/L (ref 135–145)

## 2020-02-06 LAB — I-STAT BETA HCG BLOOD, ED (MC, WL, AP ONLY): I-stat hCG, quantitative: <5 m[IU]/mL (ref <5)

## 2020-02-06 LAB — HEPATIC FUNCTION PANEL
ALT: 17 U/L (ref 0–44)
AST: 11 U/L — ABNORMAL LOW (ref 15–41)
Albumin: 2.8 g/dL — ABNORMAL LOW (ref 3.5–5.0)
Alkaline Phosphatase: 53 U/L (ref 38–126)
Bilirubin, Direct: 0.1 mg/dL (ref 0.0–0.2)
Indirect Bilirubin: 0.6 mg/dL (ref 0.3–0.9)
Total Bilirubin: 0.7 mg/dL (ref 0.3–1.2)
Total Protein: 6.5 g/dL (ref 6.5–8.1)

## 2020-02-06 LAB — RESPIRATORY PANEL BY RT PCR (FLU A&B, COVID)
Influenza A by PCR: NEGATIVE
Influenza B by PCR: NEGATIVE
SARS Coronavirus 2 by RT PCR: NEGATIVE

## 2020-02-06 LAB — CBC
HCT: 36 % (ref 36.0–46.0)
Hemoglobin: 10.9 g/dL — ABNORMAL LOW (ref 12.0–15.0)
MCH: 22.9 pg — ABNORMAL LOW (ref 26.0–34.0)
MCHC: 30.3 g/dL (ref 30.0–36.0)
MCV: 75.6 fL — ABNORMAL LOW (ref 80.0–100.0)
Platelets: 325 10*3/uL (ref 150–400)
RBC: 4.76 MIL/uL (ref 3.87–5.11)
RDW: 22.6 % — ABNORMAL HIGH (ref 11.5–15.5)
WBC: 9.7 10*3/uL (ref 4.0–10.5)
nRBC: 0 % (ref 0.0–0.2)

## 2020-02-06 LAB — TROPONIN I (HIGH SENSITIVITY)
Troponin I (High Sensitivity): <2 ng/L (ref <18)
Troponin I (High Sensitivity): <2 ng/L (ref <18)

## 2020-02-06 LAB — LIPASE, BLOOD: Lipase: 45 U/L (ref 11–51)

## 2020-02-06 MED ORDER — PROCHLORPERAZINE EDISYLATE 10 MG/2ML IJ SOLN: 5.0000 mg | Freq: Four times a day (QID) | INTRAMUSCULAR | Status: DC | PRN

## 2020-02-06 MED ORDER — SODIUM CHLORIDE 0.9% FLUSH: 3.0000 mL | Freq: Once | INTRAVENOUS | Status: DC

## 2020-02-06 MED ORDER — IOHEXOL 300 MG/ML  SOLN
100.0000 mL | Freq: Once | INTRAMUSCULAR | Status: AC | PRN
  Administered 2020-02-06: 100 mL via INTRAVENOUS

## 2020-02-06 MED ORDER — ENOXAPARIN SODIUM 40 MG/0.4ML ~~LOC~~ SOLN
40.0000 mg | SUBCUTANEOUS | Status: DC
  Administered 2020-02-06 – 2020-02-07 (×2): 40 mg via SUBCUTANEOUS
  Filled 2020-02-06 (×2): qty 0.4

## 2020-02-06 MED ORDER — MORPHINE SULFATE (PF) 2 MG/ML IV SOLN: 1.0000 mg | INTRAVENOUS | Status: DC | PRN

## 2020-02-06 MED ORDER — ONDANSETRON 4 MG PO TBDP: 4.0000 mg | ORAL_TABLET | Freq: Four times a day (QID) | ORAL | Status: DC | PRN

## 2020-02-06 MED ORDER — KCL IN DEXTROSE-NACL 20-5-0.45 MEQ/L-%-% IV SOLN
INTRAVENOUS | Status: DC
  Filled 2020-02-06 (×3): qty 1000

## 2020-02-06 MED ORDER — MORPHINE SULFATE (PF) 4 MG/ML IV SOLN
4.0000 mg | Freq: Once | INTRAVENOUS | Status: AC
  Administered 2020-02-06: 4 mg via INTRAVENOUS
  Filled 2020-02-06: qty 1

## 2020-02-06 MED ORDER — DIPHENHYDRAMINE HCL 50 MG/ML IJ SOLN: 12.5000 mg | Freq: Four times a day (QID) | INTRAMUSCULAR | Status: DC | PRN

## 2020-02-06 MED ORDER — ONDANSETRON HCL 4 MG/2ML IJ SOLN
4.0000 mg | Freq: Once | INTRAMUSCULAR | Status: AC
  Administered 2020-02-06: 4 mg via INTRAVENOUS
  Filled 2020-02-06: qty 2

## 2020-02-06 MED ORDER — DIPHENHYDRAMINE HCL 12.5 MG/5ML PO ELIX: 12.5000 mg | ORAL_SOLUTION | Freq: Four times a day (QID) | ORAL | Status: DC | PRN

## 2020-02-06 MED ORDER — ZOLPIDEM TARTRATE 5 MG PO TABS: 5.0000 mg | ORAL_TABLET | Freq: Every evening | ORAL | Status: DC | PRN

## 2020-02-06 MED ORDER — LACTATED RINGERS IV BOLUS
1000.0000 mL | Freq: Once | INTRAVENOUS | Status: AC
  Administered 2020-02-06: 1000 mL via INTRAVENOUS

## 2020-02-06 MED ORDER — ONDANSETRON HCL 4 MG/2ML IJ SOLN: 4.0000 mg | Freq: Four times a day (QID) | INTRAMUSCULAR | Status: DC | PRN

## 2020-02-06 MED ORDER — PIPERACILLIN-TAZOBACTAM 3.375 G IVPB 30 MIN
3.3750 g | Freq: Once | INTRAVENOUS | Status: AC
  Administered 2020-02-06: 3.375 g via INTRAVENOUS
  Filled 2020-02-06: qty 50

## 2020-02-06 MED ORDER — PANTOPRAZOLE SODIUM 40 MG IV SOLR
40.0000 mg | Freq: Every day | INTRAVENOUS | Status: DC
  Administered 2020-02-06 – 2020-02-07 (×2): 40 mg via INTRAVENOUS
  Filled 2020-02-06 (×2): qty 40

## 2020-02-06 MED ORDER — PROCHLORPERAZINE MALEATE 10 MG PO TABS
10.0000 mg | ORAL_TABLET | Freq: Four times a day (QID) | ORAL | Status: DC | PRN
  Filled 2020-02-06: qty 1

## 2020-02-06 NOTE — H&P (Addendum)
Shari Berger is an 44 y.o. female.   Chief Complaint: pneumoperitoneum HPI:  Pt is a 44 yo F who presented to the ED this afternoon with sudden onset of moderately severe abdominal pain.  She denies n/v.  She had TAH via pfannenstiel incision by Dr. Harolyn Rutherford 12/24/2019 for bleeding fibroids.  Initially she took quite a bit of NSAIDs and some narcotics, but hasn't been on any for 3 weeks.  She denied any abdominal pain in the intervening time.  She is continuing to pass gas.  She denies sick contacts, fever/chills.  She denies any history of ulcers, though she has had reflux occasionally in the past.  She had some sort of attempt at sexual intercourse in the last 24 hours, though she would not really specify what that was.  She was supposed to go to see GYN tomorrow in the clinic.    Past Medical History:  Diagnosis Date  . Acid reflux    ocassional  . Anemia   . Cluster headache   . Fibroid   . Migraines     Past Surgical History:  Procedure Laterality Date  . ABDOMINAL HYSTERECTOMY N/A 12/24/2019   Procedure: HYSTERECTOMY ABDOMINAL;  Surgeon: Osborne Oman, MD;  Location: [place];  Service: Gynecology;  Laterality: N/A;  . LAPAROSCOPY N/A 12/24/2019   Procedure: diagnostic laparoscopy;  Surgeon: Osborne Oman, MD;  Location: [place];  Service: Gynecology;  Laterality: N/A;  . WISDOM TOOTH EXTRACTION      Family History  Problem Relation Age of Onset  . Depression Mother   . Heart disease Father   . Diabetes Father   . Hypertension Father    Social History:  reports that she has never smoked. She has never used smokeless tobacco. She reports current alcohol use. She reports that she does not use drugs.  Allergies: No Known Allergies   Meds: Current Meds  Medication Sig  . acetaminophen (TYLENOL) 500 MG tablet Take 2 tablets (1,000 mg total) by mouth every 6 (six) hours.     Results for orders placed or performed during the hospital encounter of 02/06/20 (from the past 48  hour(s))  Basic metabolic panel     Status: Abnormal   Collection Time: 02/06/20  4:53 PM  Result Value Ref Range   Sodium 136 135 - 145 mmol/L   Potassium 3.4 (L) 3.5 - 5.1 mmol/L   Chloride 106 98 - 111 mmol/L   CO2 20 (L) 22 - 32 mmol/L   Glucose, Bld 115 (H) 70 - 99 mg/dL    Comment: Glucose reference range applies only to samples taken after fasting for at least 8 hours.   BUN 15 6 - 20 mg/dL   Creatinine, Ser 0.84 0.44 - 1.00 mg/dL   Calcium 8.6 (L) 8.9 - 10.3 mg/dL   GFR, Estimated >60 >60 mL/min    Comment: (NOTE) Calculated using the CKD-EPI Creatinine Equation (2021)    Anion gap 10 5 - 15    Comment: Performed at [facility] [address], [place], [place] [address]  CBC     Status: Abnormal   Collection Time: 02/06/20  4:53 PM  Result Value Ref Range   WBC 9.7 4.0 - 10.5 K/uL   RBC 4.76 3.87 - 5.11 MIL/uL   Hemoglobin 10.9 (L) 12.0 - 15.0 g/dL   HCT 36.0 36 - 46 %   MCV 75.6 (L) 80.0 - 100.0 fL   MCH 22.9 (L) 26.0 - 34.0 pg  MCHC 30.3 30.0 - 36.0 g/dL   RDW 22.6 (H) 11.5 - 15.5 %   Platelets 325 150 - 400 K/uL   nRBC 0.0 0.0 - 0.2 %    Comment: Performed at [facility], [place] [address] [place], [place] [address]  Troponin I (High Sensitivity)     Status: None   Collection Time: 02/06/20  4:53 PM  Result Value Ref Range   Troponin I (High Sensitivity) <2 <18 ng/L    Comment: (NOTE) Elevated high sensitivity troponin I (hsTnI) values and significant  changes across serial measurements may suggest ACS but many other  chronic and acute conditions are known to elevate hsTnI results.  Refer to the "Links" section for chest pain algorithms and additional  guidance. Performed at [facility], [place] [address] [place], [place] [address]   I-Stat beta hCG blood, ED     Status: None   Collection Time: 02/06/20  5:20 PM  Result Value Ref Range   I-stat hCG, quantitative <5.0 <5 mIU/mL   Comment 3            Comment:   GEST. AGE      CONC.   (mIU/mL)   <=1 WEEK        5 - 50     2 WEEKS       50 - 500     3 WEEKS       100 - 10,000     4 WEEKS     1,000 - 30,000        FEMALE AND NON-PREGNANT FEMALE:     LESS THAN 5 mIU/mL   Hepatic function panel     Status: Abnormal   Collection Time: 02/06/20  6:15 PM  Result Value Ref Range   Total Protein 6.5 6.5 - 8.1 g/dL   Albumin 2.8 (L) 3.5 - 5.0 g/dL   AST 11 (L) 15 - 41 U/L   ALT 17 0 - 44 U/L   Alkaline Phosphatase 53 38 - 126 U/L   Total Bilirubin 0.7 0.3 - 1.2 mg/dL   Bilirubin, Direct 0.1 0.0 - 0.2 mg/dL   Indirect Bilirubin 0.6 0.3 - 0.9 mg/dL    Comment: Performed at [facility], [place] [address], [place], [place] [address]  Lipase, blood     Status: None   Collection Time: 02/06/20  6:15 PM  Result Value Ref Range   Lipase 45 11 - 51 U/L    Comment: Performed at [facility] [address], [place],  [address]  Respiratory Panel by RT PCR (Flu A&B, Covid) - Nasopharyngeal Swab     Status: None   Collection Time: 02/06/20  6:18 PM   Specimen: Nasopharyngeal Swab  Result Value Ref Range   SARS Coronavirus 2 by RT PCR NEGATIVE NEGATIVE    Comment: (NOTE) SARS-CoV-2 target nucleic acids are NOT DETECTED.  The SARS-CoV-2 RNA is generally detectable in upper respiratoy specimens during the acute phase of infection. The lowest concentration of SARS-CoV-2 viral copies this assay can detect is 131 copies/mL. A negative result does not preclude SARS-Cov-2 infection and should not be used as the sole basis for treatment or other patient management decisions. A negative result may occur with  improper specimen collection/handling, submission of specimen other than nasopharyngeal swab, presence of viral mutation(s) within the areas targeted by this assay, and inadequate number of viral copies (<131 copies/mL). A negative result must be combined with clinical observations,  patient history, and epidemiological information. The expected result is  Negative.  Fact Sheet for Patients:  PinkCheek.be  Fact Sheet for Healthcare Providers:  GravelBags.it  This test is no t yet approved or cleared by the [place] FDA and  has been authorized for detection and/or diagnosis of SARS-CoV-2 by FDA under an Emergency Use Authorization (EUA). This EUA will remain  in effect (meaning this test can be used) for the duration of the COVID-19 declaration under Section 564(b)(1) of the Act, 21 U.S.C. section 360bbb-3(b)(1), unless the authorization is terminated or revoked sooner.     Influenza A by PCR NEGATIVE NEGATIVE   Influenza B by PCR NEGATIVE NEGATIVE    Comment: (NOTE) The Xpert Xpress SARS-CoV-2/FLU/RSV assay is intended as an aid in  the diagnosis of influenza from Nasopharyngeal swab specimens and  should not be used as a sole basis for treatment. Nasal washings and  aspirates are unacceptable for Xpert Xpress SARS-CoV-2/FLU/RSV  testing.  Fact Sheet for Patients: PinkCheek.be  Fact Sheet for Healthcare Providers: GravelBags.it  This test is not yet approved or cleared by the [place] FDA and  has been authorized for detection and/or diagnosis of SARS-CoV-2 by  FDA under an Emergency Use Authorization (EUA). This EUA will remain  in effect (meaning this test can be used) for the duration of the  Covid-19 declaration under Section 564(b)(1) of the Act, 21  U.S.C. section 360bbb-3(b)(1), unless the authorization is  terminated or revoked. Performed at [facility], [place] [address], [place], [place] [address]   Troponin I (High Sensitivity)     Status: None   Collection Time: 02/06/20  6:50 PM  Result Value Ref Range   Troponin I (High Sensitivity) <2 <18 ng/L    Comment: (NOTE) Elevated high sensitivity troponin I (hsTnI) values and significant  changes across serial measurements may suggest ACS  but many other  chronic and acute conditions are known to elevate hsTnI results.  Refer to the "Links" section for chest pain algorithms and additional  guidance. Performed at [facility], [place] [address] [place], [place] [address]    DG Chest 2 View  Result Date: 02/06/2020 CLINICAL DATA:  44 year old female with chest pain. EXAM: CHEST - 2 VIEW COMPARISON:  Chest radiograph dated 04/17/2018. FINDINGS: The lungs are clear. There is no pleural effusion pneumothorax. The cardiac silhouette is within limits. Moderate pneumoperitoneum along the diaphragms. Patient had recent surgery on 12/24/2019. However, this amount of pneumoperitoneum is more than expected. Further evaluation with CT abdomen pelvis recommended. IMPRESSION: 1. No acute cardiopulmonary process. 2. Moderate pneumoperitoneum. Further evaluation with CT of the abdomen pelvis recommended. These results were called by telephone at the time of interpretation on 02/06/2020 at 5:39 pm to provider Ralene Bathe, who verbally acknowledged these results. Electronically Signed   By: Anner Crete M.D.   On: 02/06/2020 17:44   CT ABDOMEN PELVIS W CONTRAST  Result Date: 02/06/2020 CLINICAL DATA:  44 year old female with pneumoperitoneum. EXAM: CT ABDOMEN AND PELVIS WITH CONTRAST TECHNIQUE: Multidetector CT imaging of the abdomen and pelvis was performed using the standard protocol following bolus administration of intravenous contrast. CONTRAST:  138mL OMNIPAQUE IOHEXOL 300 MG/ML  SOLN COMPARISON:  Earlier chest radiograph dated 02/06/2020. FINDINGS: Lower chest: The visualized lung bases are clear. There is moderate pneumoperitoneum. There is a small free fluid in the pelvis. Hepatobiliary: No focal liver abnormality is seen. No gallstones, gallbladder wall thickening, or biliary dilatation. Pancreas: Unremarkable. No pancreatic ductal dilatation or  surrounding inflammatory changes. Spleen: Normal in size without focal abnormality.  Adrenals/Urinary Tract: The adrenal glands unremarkable. There is no hydronephrosis on either side. There is symmetric enhancement and excretion of contrast by both kidneys. The visualized ureters and urinary bladder appear unremarkable. Stomach/Bowel: There is a small hiatal hernia. There is no bowel obstruction or active inflammation. The appendix is normal. Vascular/Lymphatic: The abdominal aorta and IVC unremarkable. No portal venous gas. There is no adenopathy. Reproductive: Hysterectomy. There is a 3 cm right ovarian cyst. The left ovary is not visualized. Other: Anterior pelvic wall C-section scar. Musculoskeletal: No acute or significant osseous findings. IMPRESSION: 1. Moderate pneumoperitoneum of indeterminate etiology. Clinical correlation and surgical consult is advised. 2. No bowel obstruction. Normal appendix. 3. A 3 cm right ovarian cyst. Electronically Signed   By: Anner Crete M.D.   On: 02/06/2020 19:01    Review of Systems  Constitutional: Negative.   HENT: Negative.   Eyes: Negative.   Respiratory: Negative.   Cardiovascular: Negative.   Gastrointestinal: Positive for abdominal pain. Negative for abdominal distention, anal bleeding, blood in stool, constipation, diarrhea, nausea, rectal pain and vomiting.  Endocrine: Negative.   Genitourinary: Negative.   Musculoskeletal: Negative.   Skin: Negative.   Allergic/Immunologic: Negative.   Neurological: Negative.   Hematological: Negative.   Psychiatric/Behavioral: Negative.   All other systems reviewed and are negative.   Blood pressure (!) 135/53, pulse 91, temperature 98 F (36.7 C), temperature source Oral, resp. rate 17, height 5\' 5"  (1.651 m), weight 97.5 kg, last menstrual period 12/18/2019, SpO2 100 %. Physical Exam Constitutional:      General: She is not in acute distress.    Appearance: She is well-developed. She is not ill-appearing, toxic-appearing or diaphoretic.  HENT:     Head: Normocephalic and  atraumatic.  Eyes:     Extraocular Movements: Extraocular movements intact.     Pupils: Pupils are equal, round, and reactive to light.  Neck:     Thyroid: No thyromegaly.  Cardiovascular:     Rate and Rhythm: Normal rate and regular rhythm.     Pulses:          Dorsalis pedis pulses are 2+ on the right side and 2+ on the left side.       Posterior tibial pulses are 2+ on the right side and 2+ on the left side.     Heart sounds: Normal heart sounds.  Pulmonary:     Effort: Pulmonary effort is normal. No accessory muscle usage.     Breath sounds: Normal breath sounds. No stridor.  Chest:     Chest wall: No mass or tenderness.  Abdominal:     General: Bowel sounds are normal.     Palpations: Abdomen is soft. There is no fluid wave, hepatomegaly or splenomegaly.     Tenderness: There is no abdominal tenderness. There is no guarding or rebound.  Musculoskeletal:     Cervical back: Normal range of motion and neck supple.     Right lower leg: No tenderness. No edema.     Left lower leg: No tenderness. No edema.  Skin:    General: Skin is warm.     Coloration: Skin is not cyanotic or pale.  Neurological:     General: No focal deficit present.     Mental Status: She is alert.  Psychiatric:        Mood and Affect: Mood normal. Mood is not anxious.        Behavior: Behavior  normal. Behavior is not agitated.      Assessment/Plan Pneumoperitoneum of unknown etiology.  I suspect this may be from the vaginal cuff with sexual intercourse following surgery as the patient is non tender, has a normal white count, normal heart rate, and her abdominal pain has resolved after one dose of morphine.    It is certainly possible that she has had a perforated ulcer or potentially a small bowel diverticulum that has perforated, but this is unlikely given the lack of continued symptoms.    It is prudent to keep her overnight given the severity of the pain and the findings on CT.  I will not continue  antibiotics, though she did get one dose of zosyn in the OR.    Will keep NPO other than ice chips.  If she remains stable tomorrow, anticipate possible advance of diet and d/c later in the day.    Stark Klein, MD 02/06/2020, 9:15 PM

## 2020-02-06 NOTE — ED Triage Notes (Signed)
Patient arrived from home complaints of chest pain that would not let her rest after work, it began as stabbing pain, and then cramping that started from left of chest moved to right then stomach. Denies N/V. Pain 10/10, hard time breathing "it feels like a cramp when I breathe" VSS NAD noted  Patient had a hysterectomy on Sept 28th

## 2020-02-06 NOTE — ED Provider Notes (Signed)
[facility] EMERGENCY DEPARTMENT Provider Note   CSN: [id number] Arrival date & time: 02/06/20  1631     History Chief Complaint  Patient presents with   Chest Pain   Shortness of Breath    Shari Berger is a 44 y.o. female.  The history is provided by the patient.  Abdominal Pain Pain location:  Epigastric Pain quality: sharp   Pain radiates to:  L shoulder and R shoulder Pain severity:  Severe Onset quality:  Sudden Timing:  Constant Progression:  Worsening Chronicity:  New Context comment:  Total abdominal hysterectomy on September 28 Relieved by:  Nothing Exacerbated by: Laying back. Associated symptoms: chest pain   Associated symptoms: no chills, no cough, no dysuria, no fever, no hematuria, no shortness of breath, no sore throat and no vomiting        Past Medical History:  Diagnosis Date   Acid reflux    ocassional   Anemia    Cluster headache    Fibroid    Migraines     Patient Active Problem List   Diagnosis Date Noted   S/P TAH (total abdominal hysterectomy) 12/24/2019   Acute postoperative anemia due to expected blood loss 12/24/2019   Anemia due to chronic blood loss 12/19/2019   Fibroids 12/19/2019   Abnormal uterine bleeding (AUB) 02/01/2019   Tendonitis of foot 04/12/2017   Pelvic pain 04/12/2017   Migraines 04/11/2017   Acid reflux 04/11/2017    Past Surgical History:  Procedure Laterality Date   ABDOMINAL HYSTERECTOMY N/A 12/24/2019   Procedure: HYSTERECTOMY ABDOMINAL;  Surgeon: Osborne Oman, MD;  Location: [facility];  Service: Gynecology;  Laterality: N/A;   LAPAROSCOPY N/A 12/24/2019   Procedure: diagnostic laparoscopy;  Surgeon: Osborne Oman, MD;  Location: [facility];  Service: Gynecology;  Laterality: N/A;   WISDOM TOOTH EXTRACTION       OB History    Gravida  2   Para  1   Term  1   Preterm  0   AB  1   Living  1     SAB      TAB      Ectopic  1   Multiple      Live  Births  1           Family History  Problem Relation Age of Onset   Depression Mother    Heart disease Father    Diabetes Father    Hypertension Father     Social History   Tobacco Use   Smoking status: Never Smoker   Smokeless tobacco: Never Used  Vaping Use   Vaping Use: Former  Substance Use Topics   Alcohol use: Yes    Comment: ocassional   Drug use: No    Home Medications Prior to Admission medications   Medication Sig Start Date End Date Taking? Authorizing Provider  acetaminophen (TYLENOL) 500 MG tablet Take 2 tablets (1,000 mg total) by mouth every 6 (six) hours. 12/26/19  Yes Anyanwu, Sallyanne Havers, MD  docusate sodium (COLACE) 100 MG capsule Take 1 capsule (100 mg total) by mouth 2 (two) times daily. Patient not taking: Reported on 02/06/2020 01/08/20   Anyanwu, Sallyanne Havers, MD  ibuprofen (ADVIL) 800 MG tablet Take 1 tablet (800 mg total) by mouth 3 (three) times daily with meals as needed for mild pain, moderate pain or cramping. Patient not taking: Reported on 02/06/2020 12/26/19   Anyanwu, Sallyanne Havers, MD  oxyCODONE (OXY IR/ROXICODONE) 5 MG  immediate release tablet Take 1 tablet (5 mg total) by mouth every 4 (four) hours as needed for severe pain or breakthrough pain. Patient not taking: Reported on 01/08/2020 12/26/19   Osborne Oman, MD    Allergies    Patient has no known allergies.  Review of Systems   Review of Systems  Constitutional: Negative for chills and fever.  HENT: Negative for ear pain and sore throat.   Eyes: Negative for pain and visual disturbance.  Respiratory: Negative for cough and shortness of breath.   Cardiovascular: Positive for chest pain. Negative for palpitations.  Gastrointestinal: Positive for abdominal pain. Negative for vomiting.  Genitourinary: Negative for dysuria and hematuria.  Musculoskeletal: Negative for arthralgias and back pain.  Skin: Negative for color change and rash.  Neurological: Negative for seizures and  syncope.  All other systems reviewed and are negative.   Physical Exam Updated Vital Signs BP (!) 169/60    Pulse (!) 107    Temp 98 F (36.7 C) (Oral)    Resp 11    Ht 5\' 5"  (1.651 m)    Wt 97.5 kg    LMP 12/18/2019    SpO2 100%    BMI 35.78 kg/m   Physical Exam Vitals and nursing note reviewed.  Constitutional:      Appearance: She is not ill-appearing, toxic-appearing or diaphoretic.  HENT:     Head: Normocephalic and atraumatic.     Mouth/Throat:     Mouth: Mucous membranes are dry.     Pharynx: Oropharynx is clear.  Eyes:     Conjunctiva/sclera: Conjunctivae normal.  Cardiovascular:     Rate and Rhythm: Normal rate and regular rhythm.     Heart sounds: No murmur heard.   Pulmonary:     Effort: Pulmonary effort is normal. No respiratory distress.     Breath sounds: Normal breath sounds.  Abdominal:     General: Bowel sounds are normal. There is no distension.     Palpations: Abdomen is soft.     Tenderness: There is abdominal tenderness in the epigastric area. There is no right CVA tenderness, left CVA tenderness, guarding or rebound.     Comments: Incision is clean, dry, intact, well-appearing  Musculoskeletal:     Cervical back: Neck supple.  Skin:    General: Skin is warm and dry.  Neurological:     Mental Status: She is alert and oriented to person, place, and time.     ED Results / Procedures / Treatments   Labs (all labs ordered are listed, but only abnormal results are displayed) Labs Reviewed  BASIC METABOLIC PANEL - Abnormal; Notable for the following components:      Result Value   Potassium 3.4 (*)    CO2 20 (*)    Glucose, Bld 115 (*)    Calcium 8.6 (*)    All other components within normal limits  CBC - Abnormal; Notable for the following components:   Hemoglobin 10.9 (*)    MCV 75.6 (*)    MCH 22.9 (*)    RDW 22.6 (*)    All other components within normal limits  RESPIRATORY PANEL BY RT PCR (FLU A&B, COVID)  CULTURE, BLOOD (ROUTINE X 2)    CULTURE, BLOOD (ROUTINE X 2)  HEPATIC FUNCTION PANEL  LIPASE, BLOOD  I-STAT BETA HCG BLOOD, ED ([facility], [facility], [facility] ONLY)  TROPONIN I (HIGH SENSITIVITY)  TROPONIN I (HIGH SENSITIVITY)    EKG EKG Interpretation  Date/Time:  Thursday February 06 2020  16:31:59 EST Ventricular Rate:  101 PR Interval:  142 QRS Duration: 74 QT Interval:  342 QTC Calculation: 443 R Axis:   -46 Text Interpretation: Sinus tachycardia Left axis deviation Possible Inferior infarct , age undetermined Anterior infarct , age undetermined Abnormal ECG Confirmed by Quintella Reichert [id number]) on 02/06/2020 5:40:14 PM   Radiology DG Chest 2 View  Result Date: 02/06/2020 CLINICAL DATA:  44 year old female with chest pain. EXAM: CHEST - 2 VIEW COMPARISON:  Chest radiograph dated 04/17/2018. FINDINGS: The lungs are clear. There is no pleural effusion pneumothorax. The cardiac silhouette is within limits. Moderate pneumoperitoneum along the diaphragms. Patient had recent surgery on 12/24/2019. However, this amount of pneumoperitoneum is more than expected. Further evaluation with CT abdomen pelvis recommended. IMPRESSION: 1. No acute cardiopulmonary process. 2. Moderate pneumoperitoneum. Further evaluation with CT of the abdomen pelvis recommended. These results were called by telephone at the time of interpretation on 02/06/2020 at 5:39 pm to provider Ralene Bathe, who verbally acknowledged these results. Electronically Signed   By: Anner Crete M.D.   On: 02/06/2020 17:44   CT ABDOMEN PELVIS W CONTRAST  Result Date: 02/06/2020 CLINICAL DATA:  44 year old female with pneumoperitoneum. EXAM: CT ABDOMEN AND PELVIS WITH CONTRAST TECHNIQUE: Multidetector CT imaging of the abdomen and pelvis was performed using the standard protocol following bolus administration of intravenous contrast. CONTRAST:  133mL OMNIPAQUE IOHEXOL 300 MG/ML  SOLN COMPARISON:  Earlier chest radiograph dated 02/06/2020. FINDINGS: Lower chest: The visualized lung bases  are clear. There is moderate pneumoperitoneum. There is a small free fluid in the pelvis. Hepatobiliary: No focal liver abnormality is seen. No gallstones, gallbladder wall thickening, or biliary dilatation. Pancreas: Unremarkable. No pancreatic ductal dilatation or surrounding inflammatory changes. Spleen: Normal in size without focal abnormality. Adrenals/Urinary Tract: The adrenal glands unremarkable. There is no hydronephrosis on either side. There is symmetric enhancement and excretion of contrast by both kidneys. The visualized ureters and urinary bladder appear unremarkable. Stomach/Bowel: There is a small hiatal hernia. There is no bowel obstruction or active inflammation. The appendix is normal. Vascular/Lymphatic: The abdominal aorta and IVC unremarkable. No portal venous gas. There is no adenopathy. Reproductive: Hysterectomy. There is a 3 cm right ovarian cyst. The left ovary is not visualized. Other: Anterior pelvic wall C-section scar. Musculoskeletal: No acute or significant osseous findings. IMPRESSION: 1. Moderate pneumoperitoneum of indeterminate etiology. Clinical correlation and surgical consult is advised. 2. No bowel obstruction. Normal appendix. 3. A 3 cm right ovarian cyst. Electronically Signed   By: Anner Crete M.D.   On: 02/06/2020 19:01    Procedures Procedures (including critical care time)  Medications Ordered in ED Medications  sodium chloride flush (NS) 0.9 % injection 3 mL (0 mLs Intravenous Hold 02/06/20 1815)  lactated ringers bolus 1,000 mL (1,000 mLs Intravenous New Bag/Given 02/06/20 1846)  morphine 4 MG/ML injection 4 mg (4 mg Intravenous Given 02/06/20 1808)  ondansetron (ZOFRAN) injection 4 mg (4 mg Intravenous Given 02/06/20 1808)  piperacillin-tazobactam (ZOSYN) IVPB 3.375 g (0 g Intravenous Stopped 02/06/20 1845)  iohexol (OMNIPAQUE) 300 MG/ML solution 100 mL (100 mLs Intravenous Contrast Given 02/06/20 1824)    ED Course  I have reviewed the triage  vital signs and the nursing notes.  Pertinent labs & imaging results that were available during my care of the patient were reviewed by me and considered in my medical decision making (see chart for details).    MDM Rules/Calculators/A&P  The patient is a 44yo female, PMH hysterectomy on 9/28 who presents to the ED for sudden onset bilateral sharp chest and abdominal pain.  On my initial evaluation, the patient is mildly tachycardic but otherwise hemodynamically stable, afebrile, nontoxic-appearing. Physical exam remarkable for epigastric tenderness to palpation.  Labs and x-ray obtained prior to my evaluation remarkable for anemia improved from postop, negative troponin, chest x-ray with pneumoperitoneum.  Consulted general surgery and obtained CT abdomen pelvis remarkable for pneumoperitoneum of unclear etiology.  Patient provided IV fluid bolus for tachycardia, IV morphine and Zofran for pain and nausea, and IV Zosyn for pneumoperitoneum concerning for perforation.    General surgery recommended admission to their service for further evaluation and management.  No further acute events while under my care.  Patient in stable condition at time of transfer to general surgery service.  The care of this patient was overseen by Dr. Ralene Bathe, who agreed with evaluation and plan of care.   Final Clinical Impression(s) / ED Diagnoses Final diagnoses:  Pneumoperitoneum  Epigastric abdominal pain    Rx / DC Orders ED Discharge Orders    None       Launa Flight, MD 02/06/20 2127    Quintella Reichert, MD 02/08/20 1359

## 2020-02-07 ENCOUNTER — Ambulatory Visit: Payer: Medicaid Other | Admitting: Obstetrics & Gynecology

## 2020-02-07 DIAGNOSIS — R1013 Epigastric pain: Secondary | ICD-10-CM

## 2020-02-07 DIAGNOSIS — K668 Other specified disorders of peritoneum: Principal | ICD-10-CM

## 2020-02-07 LAB — BASIC METABOLIC PANEL
Anion gap: 8 (ref 5–15)
BUN: 12 mg/dL (ref 6–20)
CO2: 20 mmol/L — ABNORMAL LOW (ref 22–32)
Calcium: 8.3 mg/dL — ABNORMAL LOW (ref 8.9–10.3)
Chloride: 108 mmol/L (ref 98–111)
Creatinine, Ser: 0.71 mg/dL (ref 0.44–1.00)
GFR, Estimated: >60 mL/min (ref >60)
Glucose, Bld: 95 mg/dL (ref 70–99)
Potassium: 3.9 mmol/L (ref 3.5–5.1)
Sodium: 136 mmol/L (ref 135–145)

## 2020-02-07 LAB — CBC
HCT: 33.1 % — ABNORMAL LOW (ref 36.0–46.0)
HCT: 33.7 % — ABNORMAL LOW (ref 36.0–46.0)
Hemoglobin: 10.1 g/dL — ABNORMAL LOW (ref 12.0–15.0)
Hemoglobin: 10.3 g/dL — ABNORMAL LOW (ref 12.0–15.0)
MCH: 23.3 pg — ABNORMAL LOW (ref 26.0–34.0)
MCH: 23.2 pg — ABNORMAL LOW (ref 26.0–34.0)
MCHC: 30.5 g/dL (ref 30.0–36.0)
MCHC: 30.6 g/dL (ref 30.0–36.0)
MCV: 76.3 fL — ABNORMAL LOW (ref 80.0–100.0)
MCV: 75.9 fL — ABNORMAL LOW (ref 80.0–100.0)
Platelets: 318 10*3/uL (ref 150–400)
Platelets: 305 10*3/uL (ref 150–400)
RBC: 4.34 MIL/uL (ref 3.87–5.11)
RBC: 4.44 MIL/uL (ref 3.87–5.11)
RDW: 22.6 % — ABNORMAL HIGH (ref 11.5–15.5)
RDW: 22.6 % — ABNORMAL HIGH (ref 11.5–15.5)
WBC: 9.4 10*3/uL (ref 4.0–10.5)
WBC: 8.8 10*3/uL (ref 4.0–10.5)
nRBC: 0 % (ref 0.0–0.2)
nRBC: 0 % (ref 0.0–0.2)

## 2020-02-07 LAB — HIV ANTIBODY (ROUTINE TESTING W REFLEX): HIV Screen 4th Generation wRfx: NONREACTIVE

## 2020-02-07 LAB — CREATININE, SERUM
Creatinine, Ser: 0.77 mg/dL (ref 0.44–1.00)
GFR, Estimated: >60 mL/min (ref >60)

## 2020-02-07 NOTE — Progress Notes (Signed)
Pt waited for GYN and Surgical clearance today.  She remained NPO until consults came in case she needed a procedure.  Once diet was switched to regular, pt ate dinner and drank water without incident. Pt has been urinating and ambulating independently. Last BM 11/11.

## 2020-02-07 NOTE — Progress Notes (Signed)
Subjective: CC: Patient previously had TAH on 12/24/2019 by Dr. Harolyn Rutherford.  Patient reports that yesterday, her partner and her were having sexual intercourse when she started having some vaginal bleeding so they stopped. She went to take a shower and shortly after began having abdominal pain that brought her to the ED.   Patient notes that she has had no abdominal pain, n/v since admission. Denies CP, SOB, diarrhea. She is mobilizing without abdominal pain. Voiding without difficulty. No vaginal discharge or bleeding reported.   She was supposed to follow up with Dr. Harolyn Rutherford in the office today.   Objective: Vital signs in last 24 hours: Temp:  [98 F (36.7 C)] 98 F (36.7 C) (11/11 1638) Pulse Rate:  [73-108] 101 (11/12 0709) Resp:  [0-28] 21 (11/12 0709) BP: (101-169)/(53-82) 133/78 (11/12 0709) SpO2:  [96 %-100 %] 100 % (11/12 0709) Weight:  [97.5 kg] 97.5 kg (11/11 1647)    Intake/Output from previous day: No intake/output data recorded. Intake/Output this shift: No intake/output data recorded.  PE: Gen:  Alert, NAD, pleasant HEENT: EOM's intact, pupils equal and round Card:  RRR (HR 85-88 on monitor) Pulm:  CTAB, no W/R/R, effort normal Abd: Soft, NT/ND, +BS, prior Pfannensteil incision well healed Ext:  No LE edema  Psych: A&Ox3  Skin: no rashes noted, warm and dry  Lab Results:  Recent Labs    02/06/20 1653 02/07/20 0231  WBC 9.7 8.8  9.4  HGB 10.9* 10.3*  10.1*  HCT 36.0 33.7*  33.1*  PLT 325 305  318   BMET Recent Labs    02/06/20 1653 02/07/20 0231  NA 136 136  K 3.4* 3.9  CL 106 108  CO2 20* 20*  GLUCOSE 115* 95  BUN 15 12  CREATININE 0.84 0.71  0.77  CALCIUM 8.6* 8.3*   PT/INR No results for input(s): LABPROT, INR in the last 72 hours. CMP     Component Value Date/Time   NA 136 02/07/2020 0231   K 3.9 02/07/2020 0231   CL 108 02/07/2020 0231   CO2 20 (L) 02/07/2020 0231   GLUCOSE 95 02/07/2020 0231   BUN 12 02/07/2020  0231   CREATININE 0.77 02/07/2020 0231   CREATININE 0.71 02/07/2020 0231   CALCIUM 8.3 (L) 02/07/2020 0231   PROT 6.5 02/06/2020 1815   ALBUMIN 2.8 (L) 02/06/2020 1815   AST 11 (L) 02/06/2020 1815   ALT 17 02/06/2020 1815   ALKPHOS 53 02/06/2020 1815   BILITOT 0.7 02/06/2020 1815   GFRNONAA >60 02/07/2020 0231   GFRNONAA >60 02/07/2020 0231   GFRAA >60 12/25/2019 0852   Lipase     Component Value Date/Time   LIPASE 45 02/06/2020 1815       Studies/Results: DG Chest 2 View  Result Date: 02/06/2020 CLINICAL DATA:  44 year old female with chest pain. EXAM: CHEST - 2 VIEW COMPARISON:  Chest radiograph dated 04/17/2018. FINDINGS: The lungs are clear. There is no pleural effusion pneumothorax. The cardiac silhouette is within limits. Moderate pneumoperitoneum along the diaphragms. Patient had recent surgery on 12/24/2019. However, this amount of pneumoperitoneum is more than expected. Further evaluation with CT abdomen pelvis recommended. IMPRESSION: 1. No acute cardiopulmonary process. 2. Moderate pneumoperitoneum. Further evaluation with CT of the abdomen pelvis recommended. These results were called by telephone at the time of interpretation on 02/06/2020 at 5:39 pm to provider Ralene Bathe, who verbally acknowledged these results. Electronically Signed   By: Anner Crete M.D.   On: 02/06/2020  17:44   CT ABDOMEN PELVIS W CONTRAST  Result Date: 02/06/2020 CLINICAL DATA:  44 year old female with pneumoperitoneum. EXAM: CT ABDOMEN AND PELVIS WITH CONTRAST TECHNIQUE: Multidetector CT imaging of the abdomen and pelvis was performed using the standard protocol following bolus administration of intravenous contrast. CONTRAST:  113mL OMNIPAQUE IOHEXOL 300 MG/ML  SOLN COMPARISON:  Earlier chest radiograph dated 02/06/2020. FINDINGS: Lower chest: The visualized lung bases are clear. There is moderate pneumoperitoneum. There is a small free fluid in the pelvis. Hepatobiliary: No focal liver abnormality  is seen. No gallstones, gallbladder wall thickening, or biliary dilatation. Pancreas: Unremarkable. No pancreatic ductal dilatation or surrounding inflammatory changes. Spleen: Normal in size without focal abnormality. Adrenals/Urinary Tract: The adrenal glands unremarkable. There is no hydronephrosis on either side. There is symmetric enhancement and excretion of contrast by both kidneys. The visualized ureters and urinary bladder appear unremarkable. Stomach/Bowel: There is a small hiatal hernia. There is no bowel obstruction or active inflammation. The appendix is normal. Vascular/Lymphatic: The abdominal aorta and IVC unremarkable. No portal venous gas. There is no adenopathy. Reproductive: Hysterectomy. There is a 3 cm right ovarian cyst. The left ovary is not visualized. Other: Anterior pelvic wall C-section scar. Musculoskeletal: No acute or significant osseous findings. IMPRESSION: 1. Moderate pneumoperitoneum of indeterminate etiology. Clinical correlation and surgical consult is advised. 2. No bowel obstruction. Normal appendix. 3. A 3 cm right ovarian cyst. Electronically Signed   By: Anner Crete M.D.   On: 02/06/2020 19:01    Anti-infectives: Anti-infectives (From admission, onward)   Start     Dose/Rate Route Frequency Ordered Stop   02/06/20 1745  piperacillin-tazobactam (ZOSYN) IVPB 3.375 g        3.375 g 100 mL/hr over 30 Minutes Intravenous  Once 02/06/20 1741 02/06/20 1845       Assessment/Plan Pneumoperitoneum of unknown etiology - Suspect that patient pneumoperitoneum is 2/2 vaginal cuff dehiscence after sexual intercourse with patient previously hx of TAH on 12/24/2019 by Dr. Harolyn Rutherford. I will consult GYN for their input on this. She was supposed to follow up with Dr. Harolyn Rutherford in the office today.  - Patient is afebrile without tachycardia (HR 85-88 while I was in the room) or hypotension. WBC remains normal. Her abdominal pain has resolved. She is NT on exam without  peritonitis. No indication for emergency surgery. Will continue to monitor closely.  - Will keep NPO for now until I discuss with GYN. If patient cleared by GNY will advance diet - No abx indicated at this time.   FEN - NPO, IVF VTE - SCDs, Lovenox ID - Zosyn x 1 in ED. None currently Foley - None    LOS: 0 days    Jillyn Ledger , [facility] 02/07/2020, 7:53 AM Please see Amion for pager number during day hours 7:00am-4:30pm

## 2020-02-07 NOTE — Consult Note (Signed)
Reason for Consult: Vaginal bleeding, S/P TAH 12/24/19 Referring Physician: [facility] Surgery  Shari Berger is an 44 y.o. female was admitted to general surgery yesterday evening with unknown etiology of pneumoperitoneum. Pt attempted IC with her husband yesterday afternoon, noted some vaginal bleeding and sudden on set of severe abd pain. Stopped IC and presented to [facility] ED for further eval. CT scan revealed a small pneumoperitoneum.   Pt had a TAH on 12/24/19 by Dr Harolyn Rutherford. Post op course has been unremarkable till this point.   Pt currently denies any abd or pelvic pain. No N/V. No more vaginal bleeding either   Menstrual History: Menarche age: 83 Patient's last menstrual period was 12/18/2019.    Past Medical History:  Diagnosis Date  . Acid reflux    ocassional  . Anemia   . Cluster headache   . Fibroid   . Migraines     Past Surgical History:  Procedure Laterality Date  . ABDOMINAL HYSTERECTOMY N/A 12/24/2019   Procedure: HYSTERECTOMY ABDOMINAL;  Surgeon: Osborne Oman, MD;  Location: [place];  Service: Gynecology;  Laterality: N/A;  . LAPAROSCOPY N/A 12/24/2019   Procedure: diagnostic laparoscopy;  Surgeon: Osborne Oman, MD;  Location: [place];  Service: Gynecology;  Laterality: N/A;  . WISDOM TOOTH EXTRACTION      Family History  Problem Relation Age of Onset  . Depression Mother   . Heart disease Father   . Diabetes Father   . Hypertension Father     Social History:  reports that she has never smoked. She has never used smokeless tobacco. She reports current alcohol use. She reports that she does not use drugs.  Allergies: No Known Allergies  Medications: I have reviewed the patient's current medications.  Review of Systems As per HPI  Blood pressure 137/66, pulse 91, temperature 98 F (36.7 C), temperature source Oral, resp. rate 18, height 5\' 5"  (1.651 m), weight 97.5 kg, last menstrual period 12/18/2019, SpO2 100 %. Physical Exam  Pleasant female  in NAD, lying in bed  Lungs clear Heart RRR Abd soft, + BS, non tender, incision well healed GU Nl EGBUS, scant mucoid discharge with slight blood tinge, no active vaginal bleeding noted, cuff appears to be healing well, no frank dehiscent noted, probed with Q tip and intact, bimanual exam confirms healing and intact cuff, cervix and uterus absent, non tender and no masses  Results for orders placed or performed during the hospital encounter of 02/06/20 (from the past 48 hour(s))  Basic metabolic panel     Status: Abnormal   Collection Time: 02/06/20  4:53 PM  Result Value Ref Range   Sodium 136 135 - 145 mmol/L   Potassium 3.4 (L) 3.5 - 5.1 mmol/L   Chloride 106 98 - 111 mmol/L   CO2 20 (L) 22 - 32 mmol/L   Glucose, Bld 115 (H) 70 - 99 mg/dL    Comment: Glucose reference range applies only to samples taken after fasting for at least 8 hours.   BUN 15 6 - 20 mg/dL   Creatinine, Ser 0.84 0.44 - 1.00 mg/dL   Calcium 8.6 (L) 8.9 - 10.3 mg/dL   GFR, Estimated >60 >60 mL/min    Comment: (NOTE) Calculated using the CKD-EPI Creatinine Equation (2021)    Anion gap 10 5 - 15    Comment: Performed at [facility] [address] [place], [place] [address]  CBC     Status: Abnormal   Collection Time: 02/06/20  4:53 PM  Result Value Ref Range   WBC 9.7 4.0 - 10.5 K/uL   RBC 4.76 3.87 - 5.11 MIL/uL   Hemoglobin 10.9 (L) 12.0 - 15.0 g/dL   HCT 36.0 36 - 46 %   MCV 75.6 (L) 80.0 - 100.0 fL   MCH 22.9 (L) 26.0 - 34.0 pg   MCHC 30.3 30.0 - 36.0 g/dL   RDW 22.6 (H) 11.5 - 15.5 %   Platelets 325 150 - 400 K/uL   nRBC 0.0 0.0 - 0.2 %    Comment: Performed at [facility] [address] [place], [place] [address]  Troponin I (High Sensitivity)     Status: None   Collection Time: 02/06/20  4:53 PM  Result Value Ref Range   Troponin I (High Sensitivity) <2 <18 ng/L    Comment: (NOTE) Elevated high sensitivity troponin I (hsTnI) values and significant  changes across serial  measurements may suggest ACS but many other  chronic and acute conditions are known to elevate hsTnI results.  Refer to the "Links" section for chest pain algorithms and additional  guidance. Performed at [facility], [place] [address] [place], [place] [address]   I-Stat beta hCG blood, ED     Status: None   Collection Time: 02/06/20  5:20 PM  Result Value Ref Range   I-stat hCG, quantitative <5.0 <5 mIU/mL   Comment 3            Comment:   GEST. AGE      CONC.  (mIU/mL)   <=1 WEEK        5 - 50     2 WEEKS       50 - 500     3 WEEKS       100 - 10,000     4 WEEKS     1,000 - 30,000        FEMALE AND NON-PREGNANT FEMALE:     LESS THAN 5 mIU/mL   Blood culture (routine x 2)     Status: None (Preliminary result)   Collection Time: 02/06/20  6:00 PM   Specimen: BLOOD RIGHT ARM  Result Value Ref Range   Specimen Description BLOOD RIGHT ARM    Special Requests      BOTTLES DRAWN AEROBIC AND ANAEROBIC Blood Culture adequate volume   Culture      NO GROWTH < 24 HOURS Performed at [facility], [place] [address], [place], [place] [address]    Report Status PENDING   Hepatic function panel     Status: Abnormal   Collection Time: 02/06/20  6:15 PM  Result Value Ref Range   Total Protein 6.5 6.5 - 8.1 g/dL   Albumin 2.8 (L) 3.5 - 5.0 g/dL   AST 11 (L) 15 - 41 U/L   ALT 17 0 - 44 U/L   Alkaline Phosphatase 53 38 - 126 U/L   Total Bilirubin 0.7 0.3 - 1.2 mg/dL   Bilirubin, Direct 0.1 0.0 - 0.2 mg/dL   Indirect Bilirubin 0.6 0.3 - 0.9 mg/dL    Comment: Performed at [facility] [address] [place], [place] [address]  Lipase, blood     Status: None   Collection Time: 02/06/20  6:15 PM  Result Value Ref Range   Lipase 45 11 - 51 U/L    Comment: Performed at [facility] [address], [place], [place] [address]  Respiratory Panel by RT PCR (  Flu A&B, Covid) - Nasopharyngeal Swab     Status: None   Collection Time: 02/06/20  6:18 PM   Specimen:  Nasopharyngeal Swab  Result Value Ref Range   SARS Coronavirus 2 by RT PCR NEGATIVE NEGATIVE    Comment: (NOTE) SARS-CoV-2 target nucleic acids are NOT DETECTED.  The SARS-CoV-2 RNA is generally detectable in upper respiratoy specimens during the acute phase of infection. The lowest concentration of SARS-CoV-2 viral copies this assay can detect is 131 copies/mL. A negative result does not preclude SARS-Cov-2 infection and should not be used as the sole basis for treatment or other patient management decisions. A negative result may occur with  improper specimen collection/handling, submission of specimen other than nasopharyngeal swab, presence of viral mutation(s) within the areas targeted by this assay, and inadequate number of viral copies (<131 copies/mL). A negative result must be combined with clinical observations, patient history, and epidemiological information. The expected result is Negative.  Fact Sheet for Patients:  PinkCheek.be  Fact Sheet for Healthcare Providers:  GravelBags.it  This test is no t yet approved or cleared by the [place] FDA and  has been authorized for detection and/or diagnosis of SARS-CoV-2 by FDA under an Emergency Use Authorization (EUA). This EUA will remain  in effect (meaning this test can be used) for the duration of the COVID-19 declaration under Section 564(b)(1) of the Act, 21 U.S.C. section 360bbb-3(b)(1), unless the authorization is terminated or revoked sooner.     Influenza A by PCR NEGATIVE NEGATIVE   Influenza B by PCR NEGATIVE NEGATIVE    Comment: (NOTE) The Xpert Xpress SARS-CoV-2/FLU/RSV assay is intended as an aid in  the diagnosis of influenza from Nasopharyngeal swab specimens and  should not be used as a sole basis for treatment. Nasal washings and  aspirates are unacceptable for Xpert Xpress SARS-CoV-2/FLU/RSV  testing.  Fact Sheet for  Patients: PinkCheek.be  Fact Sheet for Healthcare Providers: GravelBags.it  This test is not yet approved or cleared by the [place] FDA and  has been authorized for detection and/or diagnosis of SARS-CoV-2 by  FDA under an Emergency Use Authorization (EUA). This EUA will remain  in effect (meaning this test can be used) for the duration of the  Covid-19 declaration under Section 564(b)(1) of the Act, 21  U.S.C. section 360bbb-3(b)(1), unless the authorization is  terminated or revoked. Performed at [facility], [place] [address] [place], [place] [address]   Troponin I (High Sensitivity)     Status: None   Collection Time: 02/06/20  6:50 PM  Result Value Ref Range   Troponin I (High Sensitivity) <2 <18 ng/L    Comment: (NOTE) Elevated high sensitivity troponin I (hsTnI) values and significant  changes across serial measurements may suggest ACS but many other  chronic and acute conditions are known to elevate hsTnI results.  Refer to the "Links" section for chest pain algorithms and additional  guidance. Performed at [facility], [place] [address], [place], [place] [address]   Blood culture (routine x 2)     Status: None (Preliminary result)   Collection Time: 02/06/20  7:01 PM   Specimen: BLOOD  Result Value Ref Range   Specimen Description BLOOD LEFT ANTECUBITAL    Special Requests      BOTTLES DRAWN AEROBIC AND ANAEROBIC Blood Culture adequate volume   Culture      NO GROWTH < 24 HOURS Performed at [facility], [place] [address], [place], [place] [address]  Report Status PENDING   HIV Antibody (routine testing w rflx)     Status: None   Collection Time: 02/07/20  2:31 AM  Result Value Ref Range   HIV Screen 4th Generation wRfx Non Reactive Non Reactive    Comment: Performed at [facility], [place] [address] [place], [place] [address]  CBC     Status: Abnormal   Collection Time: 02/07/20   2:31 AM  Result Value Ref Range   WBC 9.4 4.0 - 10.5 K/uL   RBC 4.34 3.87 - 5.11 MIL/uL   Hemoglobin 10.1 (L) 12.0 - 15.0 g/dL   HCT 33.1 (L) 36 - 46 %   MCV 76.3 (L) 80.0 - 100.0 fL   MCH 23.3 (L) 26.0 - 34.0 pg   MCHC 30.5 30.0 - 36.0 g/dL   RDW 22.6 (H) 11.5 - 15.5 %   Platelets 318 150 - 400 K/uL   nRBC 0.0 0.0 - 0.2 %    Comment: Performed at [facility], [place] [address], [place], [place] [address]  Creatinine, serum     Status: None   Collection Time: 02/07/20  2:31 AM  Result Value Ref Range   Creatinine, Ser 0.77 0.44 - 1.00 mg/dL   GFR, Estimated >60 >60 mL/min    Comment: (NOTE) Calculated using the CKD-EPI Creatinine Equation (2021) Performed at [facility] [address] [place], [place] [address]   Basic metabolic panel     Status: Abnormal   Collection Time: 02/07/20  2:31 AM  Result Value Ref Range   Sodium 136 135 - 145 mmol/L   Potassium 3.9 3.5 - 5.1 mmol/L   Chloride 108 98 - 111 mmol/L   CO2 20 (L) 22 - 32 mmol/L   Glucose, Bld 95 70 - 99 mg/dL    Comment: Glucose reference range applies only to samples taken after fasting for at least 8 hours.   BUN 12 6 - 20 mg/dL   Creatinine, Ser 0.71 0.44 - 1.00 mg/dL   Calcium 8.3 (L) 8.9 - 10.3 mg/dL   GFR, Estimated >60 >60 mL/min    Comment: (NOTE) Calculated using the CKD-EPI Creatinine Equation (2021)    Anion gap 8 5 - 15    Comment: Performed at [facility] [address], [place], [place] [address]  CBC     Status: Abnormal   Collection Time: 02/07/20  2:31 AM  Result Value Ref Range   WBC 8.8 4.0 - 10.5 K/uL   RBC 4.44 3.87 - 5.11 MIL/uL   Hemoglobin 10.3 (L) 12.0 - 15.0 g/dL   HCT 33.7 (L) 36 - 46 %   MCV 75.9 (L) 80.0 - 100.0 fL   MCH 23.2 (L) 26.0 - 34.0 pg   MCHC 30.6 30.0 - 36.0 g/dL   RDW 22.6 (H) 11.5 - 15.5 %   Platelets 305 150 - 400 K/uL   nRBC 0.0 0.0 - 0.2 %    Comment: Performed at [facility], [place] [address], [place], [place] [address]    DG Chest 2  View  Result Date: 02/06/2020 CLINICAL DATA:  44 year old female with chest pain. EXAM: CHEST - 2 VIEW COMPARISON:  Chest radiograph dated 04/17/2018. FINDINGS: The lungs are clear. There is no pleural effusion pneumothorax. The cardiac silhouette is within limits. Moderate pneumoperitoneum along the diaphragms. Patient had recent surgery on 12/24/2019. However, this amount of pneumoperitoneum is more than expected. Further evaluation with CT abdomen pelvis recommended. IMPRESSION:  1. No acute cardiopulmonary process. 2. Moderate pneumoperitoneum. Further evaluation with CT of the abdomen pelvis recommended. These results were called by telephone at the time of interpretation on 02/06/2020 at 5:39 pm to provider Ralene Bathe, who verbally acknowledged these results. Electronically Signed   By: Anner Crete M.D.   On: 02/06/2020 17:44   CT ABDOMEN PELVIS W CONTRAST  Result Date: 02/06/2020 CLINICAL DATA:  44 year old female with pneumoperitoneum. EXAM: CT ABDOMEN AND PELVIS WITH CONTRAST TECHNIQUE: Multidetector CT imaging of the abdomen and pelvis was performed using the standard protocol following bolus administration of intravenous contrast. CONTRAST:  147mL OMNIPAQUE IOHEXOL 300 MG/ML  SOLN COMPARISON:  Earlier chest radiograph dated 02/06/2020. FINDINGS: Lower chest: The visualized lung bases are clear. There is moderate pneumoperitoneum. There is a small free fluid in the pelvis. Hepatobiliary: No focal liver abnormality is seen. No gallstones, gallbladder wall thickening, or biliary dilatation. Pancreas: Unremarkable. No pancreatic ductal dilatation or surrounding inflammatory changes. Spleen: Normal in size without focal abnormality. Adrenals/Urinary Tract: The adrenal glands unremarkable. There is no hydronephrosis on either side. There is symmetric enhancement and excretion of contrast by both kidneys. The visualized ureters and urinary bladder appear unremarkable. Stomach/Bowel: There is a small hiatal  hernia. There is no bowel obstruction or active inflammation. The appendix is normal. Vascular/Lymphatic: The abdominal aorta and IVC unremarkable. No portal venous gas. There is no adenopathy. Reproductive: Hysterectomy. There is a 3 cm right ovarian cyst. The left ovary is not visualized. Other: Anterior pelvic wall C-section scar. Musculoskeletal: No acute or significant osseous findings. IMPRESSION: 1. Moderate pneumoperitoneum of indeterminate etiology. Clinical correlation and surgical consult is advised. 2. No bowel obstruction. Normal appendix. 3. A 3 cm right ovarian cyst. Electronically Signed   By: Anner Crete M.D.   On: 02/06/2020 19:01    Assessment/Plan: Pneumoperitoneum ? Etiology S/P TAH 12/24/19  Exam does not reveal and cuff compromise but a small rent in the cuff that has now resolved and or unable to locate, is possible. Be that as it may, no type of surgerical intervention needed at this time. Ok to advance diet and discharge home from a GYN standpoint. Pt given instructions to reframe from [place] for at least additional 2 weeks. Will also schedule follow up appt with Dr Harolyn Rutherford in 2 weeks  On behalf of [facility], I would like to think [facility] for allowing [place] to participate in Ms White's care. Please do not hesitate to contact us for any additional questions or concerns.    [phone]  M-F   8A-5P   All other times [phone]  Arlina Robes, MD Clint Lipps 02/07/2020

## 2020-02-08 NOTE — Discharge Instructions (Signed)
Hysterectomy Information  A hysterectomy is a surgery to remove your uterus. After surgery, you will no longer have periods. Also, you will no longer be able to get pregnant. Reasons for this surgery You may have this surgery if:  You have bleeding in your vagina: ? That is not normal. ? That does not stop, or that keeps coming back.  You have long-term (chronic) pain in your lower belly (pelvic area).  The lining of your uterus grows outside of the uterus (endometriosis).  The lining of your uterus grows in the muscle of the uterus (adenomyosis).  Your uterus falls down into your vagina (prolapse).  You have a growth in your uterus that causes problems (uterine fibroids).  You have cells that could turn into cancer (precancerous cells).  You have cancer of the uterus or cervix. Types of hysterectomies There are 3 types of hysterectomies. Depending on the type, the surgery will:  Remove the top part of the uterus (supracervical).  Remove the uterus and the cervix (total).  Remove the uterus, cervix, and tissue that holds the uterus in place (radical). Ways a hysterectomy can be done This surgery may be done in one of these ways:  A cut (incision) is made in the belly (abdomen). The uterus is taken out through the cut.  A cut is made in the vagina. The uterus is taken out through the cut.  Three or four cuts are made in the belly. A device with a camera is put through one of the cuts. The uterus is cut into pieces and taken out through the cuts or the vagina.  Three or four cuts are made in the belly. A device with a camera is put through one of the cuts. The uterus is taken out through the vagina.  Three or four cuts are made in the belly. A computer helps control the surgical tools. The uterus is cut into small pieces. The pieces are taken out through the cuts or through the vagina. Talk with your doctor about which way is best for you. Risks of hysterectomy Generally,  this surgery is safe. However, problems can happen, including:  Bleeding.  Needing donated blood (transfusion).  Blood clots.  Infection.  Damage to other structures or organs.  Allergic reactions.  Needing to switch to a different type of surgery. What to expect after surgery  You will be given pain medicine.  You will need to stay in the hospital for 1-2 days.  Follow your doctor's instructions about: ? Exercising. ? Driving. ? What activities are safe for you.  You will need to have someone with you at home for 3-5 days.  You will need to see your doctor after 2-4 weeks.  You may get hot flashes, have night sweats, and have trouble sleeping.  You may need to have Pap tests if your surgery was related to cancer. Talk with your doctor about how often you need Pap tests. Questions to ask your doctor  Do I need this surgery? Do I have other treatment options?  What are my options for this surgery?  What needs to be removed?  What are the risks?  What are the benefits?  How long will I need to stay in the hospital?  How long will I need to recover?  What symptoms can I expect after the procedure? Summary  A hysterectomy is a surgery to remove your uterus. After surgery, you will no longer have periods. Also, you will no longer be able to   get pregnant.  Talk with your doctor about which type of hysterectomy is best for you. This information is not intended to replace advice given to you by your health care provider. Make sure you discuss any questions you have with your health care provider. Document Revised: 05/17/2018 Document Reviewed: 06/14/2016 Elsevier Patient Education  2020 Elsevier Inc.  

## 2020-02-08 NOTE — Discharge Summary (Signed)
Physician Discharge Summary  Patient ID: Shari Berger MRN: [id number] DOB/AGE: May 12, 1975 44 y.o.  PCP: Kerin Perna, NP  Admit date: 02/06/2020 Discharge date: 02/08/2020  Admission Diagnoses:  Small pneumoperitoneum after intercourse  Discharge Diagnoses:  same  Active Problems:   Pneumoperitoneum of unknown etiology   Surgery:  none  Discharged Condition: improved  Hospital Course:   Was admitted to general surgery after pain after intercourse.  Dr. Mariane Masters saw and approved discharge today after she has resumed eating.  Will see Dr. Harolyn Rutherford in followup  Consults: Dr. Mariane Masters  Significant Diagnostic Studies: CT    Discharge Exam: Blood pressure 122/72, pulse 87, temperature (!) 97.5 F (36.4 C), temperature source Oral, resp. rate 18, height 5\' 5"  (1.651 m), weight 97.5 kg, last menstrual period 12/18/2019, SpO2 100 %. I did not perform a pelvic exam on discharge but defer to Dr. Mariane Masters and his findings noted  Disposition: Discharge disposition: 01-Home or Self Care       Discharge Instructions    Call MD for:   Complete by: As directed    Followup with your gynecologist and follow their post hysterectomy orders with the additional restriction of no coitus for at least another 2 weeks.   Diet - low sodium heart healthy   Complete by: As directed    Increase activity slowly   Complete by: As directed    Sexual Activity Restrictions   Complete by: As directed    Refrain from sexual intercourse for at least 2 weeks and until you have followup visit with your gynecologist     Allergies as of 02/08/2020   No Known Allergies     Medication List    TAKE these medications   acetaminophen 500 MG tablet Commonly known as: TYLENOL Take 2 tablets (1,000 mg total) by mouth every 6 (six) hours.   docusate sodium 100 MG capsule Commonly known as: COLACE Take 1 capsule (100 mg total) by mouth 2 (two) times daily.   ibuprofen 800 MG tablet Commonly known as:  ADVIL Take 1 tablet (800 mg total) by mouth 3 (three) times daily with meals as needed for mild pain, moderate pain or cramping.   oxyCODONE 5 MG immediate release tablet Commonly known as: Oxy IR/ROXICODONE Take 1 tablet (5 mg total) by mouth every 4 (four) hours as needed for severe pain or breakthrough pain.       Follow-up Information    Anyanwu, Sallyanne Havers, MD. Schedule an appointment as soon as possible for a visit in 2 week(s).   Specialty: Obstetrics and Gynecology Why: Followup before resuming sexual intercourse Contact information: [place] [address] [phone]               Signed: Pedro Earls 02/08/2020, 9:22 AM

## 2020-02-10 ENCOUNTER — Encounter: Payer: Self-pay | Admitting: Lactation Services

## 2020-02-10 ENCOUNTER — Telehealth: Payer: Self-pay

## 2020-02-10 NOTE — Telephone Encounter (Signed)
Transition Care Management Follow-up Telephone Call  Date of discharge and from where: 02/08/2020, [facility]   How have you been since you were released from the hospital?She said she is doing fine  Any questions or concerns? No  Items Reviewed:  Did the pt receive and understand the discharge instructions provided? Yes   Medications obtained and verified? Yes  - no questions about her med regime  Other? No   Any new allergies since your discharge? No   Home Care and Equipment/Supplies: Were home health services ordered? No If so, what is the name of the agency? n/a Has the agency set up a time to come to the patient's home?n/a Were any new equipment or medical supplies ordered?  No What is the name of the medical supply agency?n/a Were you able to get the supplies/equipment?n/a Do you have any questions related to the use of the equipment or supplies? No - n/a  Functional Questionnaire: (I = Independent and D = Dependent) ADLs: independent  Follow up appointments reviewed:   PCP Hospital f/u appt confirmed? No  she said she will call the clinic to schedule an appointment.  [facility] f/u appt confirmed? No  - she said she will call later today to schedule follow up with GYN.  Are transportation arrangements needed? No   If their condition worsens, is the pt aware to call PCP or go to the Emergency Dept.?  yes  Was the patient provided with contact information for the PCP's office or ED?  she has the contact information for the clinic  Was to pt encouraged to call back with questions or concerns?  yes

## 2020-02-11 LAB — CULTURE, BLOOD (ROUTINE X 2)
Culture: NO GROWTH
Culture: NO GROWTH
Special Requests: ADEQUATE
Special Requests: ADEQUATE

## 2020-02-28 ENCOUNTER — Encounter: Payer: Self-pay | Admitting: General Practice

## 2020-03-11 ENCOUNTER — Encounter: Payer: Self-pay | Admitting: Obstetrics & Gynecology

## 2020-03-11 ENCOUNTER — Other Ambulatory Visit: Payer: Self-pay

## 2020-03-11 ENCOUNTER — Ambulatory Visit (INDEPENDENT_AMBULATORY_CARE_PROVIDER_SITE_OTHER): Payer: Medicaid Other | Admitting: Obstetrics & Gynecology

## 2020-03-11 VITALS — BP 128/79 | HR 88 | Ht 65.0 in | Wt 223.7 lb

## 2020-03-11 DIAGNOSIS — Z90710 Acquired absence of both cervix and uterus: Secondary | ICD-10-CM

## 2020-03-11 DIAGNOSIS — Z09 Encounter for follow-up examination after completed treatment for conditions other than malignant neoplasm: Secondary | ICD-10-CM

## 2020-03-11 NOTE — Progress Notes (Signed)
    GYNECOLOGY POSTOPERATIVE VISIT  Subjective:     Shari Berger is a 44 y.o. female who presents to the clinic status post total abdominal hysterectomy, lysis of adhesions, right salpingectomy on 12/24/19 for abnormal uterine bleeding, uterine fibroids, anemia due to chronic blood loss, pelvic pain.  Of note, patient was admitted and observed on 02/06/2020 after she had bleeding and pain during intercourse, imaging in ED showed possible intraperitoneal air. No dehiscence or viscus injury noted on exam. Eating a regular diet without difficulty. Bowel movements are normal. The patient is not having any pain.    The following portions of the patient's history were reviewed and updated as appropriate: allergies, current medications, past family history, past medical history, past social history, past surgical history and problem list.  Review of Systems Pertinent items noted in HPI and remainder of comprehensive ROS otherwise negative.    Objective:    LMP 12/18/2019  General:  no distress  Abdomen: soft, bowel sounds active, non-tender  Incision:   healing well, no drainage, no erythema, no hernia, no seroma, no swelling, no dehiscence, incision well approximated  Pelvic: Well-healing vaginal cuff. Small area 4 mm granulation tissue in middle, treated with silver nitrate. No defects visualized or palpated on bimanual exam    12/24/2019  Surgical Pathology UTERUS AND CERVIX, RIGHT FALLOPIAN TUBE, HYSTERECTOMY:  - Cervix    Unremarkable.    No dysplasia or carcinoma.  - Endometrium    Inactive with progestational changes.    No hyperplasia or carcinoma.  - Myometrium    Leiomyomata.    No evidence of malignancy.  - Right Fallopian tube    Unremarkable.    No endometriosis or malignancy.   Assessment:    Doing well postoperatively. Operative findings again reviewed. Pathology report discussed.    Plan:    1. Continue any current medications. 2. Wound care  discussed. 3. Activity restrictions: no intercourse for less than one more week, allow for granulation tissue treatment. 4. Anticipated return to work: not applicable. 5. Follow up: as needed.    Verita Schneiders, MD, [facility] [employer] [facility] [employer], [place]

## 2020-05-30 ENCOUNTER — Emergency Department (HOSPITAL_COMMUNITY)
Admission: EM | Admit: 2020-05-30 | Discharge: 2020-05-30 | Disposition: A | Discharge status: Home / Self Care | Payer: Medicaid Other | Attending: Emergency Medicine | Admitting: Emergency Medicine

## 2020-05-30 ENCOUNTER — Encounter (HOSPITAL_COMMUNITY): Payer: Self-pay | Admitting: Emergency Medicine

## 2020-05-30 ENCOUNTER — Other Ambulatory Visit: Payer: Self-pay

## 2020-05-30 DIAGNOSIS — M79605 Pain in left leg: Secondary | ICD-10-CM | POA: Insufficient documentation

## 2020-05-30 DIAGNOSIS — Z5321 Procedure and treatment not carried out due to patient leaving prior to being seen by health care provider: Secondary | ICD-10-CM | POA: Diagnosis not present

## 2020-05-30 DIAGNOSIS — M545 Low back pain, unspecified: Secondary | ICD-10-CM | POA: Insufficient documentation

## 2020-05-30 NOTE — ED Triage Notes (Signed)
C/o R lower back pain and L posterior leg pain x 1 1/2 weeks.  Denies injury.  Denies urinary complaints.

## 2020-06-10 ENCOUNTER — Encounter (INDEPENDENT_AMBULATORY_CARE_PROVIDER_SITE_OTHER): Payer: Self-pay | Admitting: Primary Care

## 2020-06-10 ENCOUNTER — Ambulatory Visit (INDEPENDENT_AMBULATORY_CARE_PROVIDER_SITE_OTHER): Payer: Medicaid Other | Admitting: Primary Care

## 2020-06-10 ENCOUNTER — Other Ambulatory Visit: Payer: Self-pay

## 2020-06-10 VITALS — BP 126/84 | HR 88 | Temp 97.7°F | Ht 65.0 in | Wt 234.6 lb

## 2020-06-10 DIAGNOSIS — G8929 Other chronic pain: Secondary | ICD-10-CM

## 2020-06-10 DIAGNOSIS — D509 Iron deficiency anemia, unspecified: Secondary | ICD-10-CM

## 2020-06-10 DIAGNOSIS — E6609 Other obesity due to excess calories: Secondary | ICD-10-CM

## 2020-06-10 DIAGNOSIS — Z131 Encounter for screening for diabetes mellitus: Secondary | ICD-10-CM

## 2020-06-10 DIAGNOSIS — M722 Plantar fascial fibromatosis: Secondary | ICD-10-CM | POA: Diagnosis not present

## 2020-06-10 DIAGNOSIS — M545 Low back pain, unspecified: Secondary | ICD-10-CM | POA: Diagnosis not present

## 2020-06-10 DIAGNOSIS — Z6839 Body mass index (BMI) 39.0-39.9, adult: Secondary | ICD-10-CM

## 2020-06-10 DIAGNOSIS — J302 Other seasonal allergic rhinitis: Secondary | ICD-10-CM

## 2020-06-10 LAB — POCT GLYCOSYLATED HEMOGLOBIN (HGB A1C): Hemoglobin A1C: 5.2 % (ref 4.0–5.6)

## 2020-06-10 MED ORDER — LORATADINE 10 MG PO TABS: 10.0000 mg | ORAL_TABLET | Freq: Every day | ORAL | 11 refills | Status: DC

## 2020-06-10 MED ORDER — FLUTICASONE PROPIONATE 50 MCG/ACT NA SUSP: 2.0000 | Freq: Every day | NASAL | 6 refills | Status: DC

## 2020-06-10 MED ORDER — IBUPROFEN 800 MG PO TABS: 800.0000 mg | ORAL_TABLET | Freq: Three times a day (TID) | ORAL | 1 refills | Status: DC | PRN

## 2020-06-10 NOTE — Progress Notes (Signed)
Hurts to apply to pressure to feet

## 2020-06-10 NOTE — Patient Instructions (Signed)
Plantar Fasciitis  Plantar fasciitis is a painful foot condition that affects the heel. It occurs when the band of tissue that connects the toes to the heel bone (plantar fascia) becomes irritated. This can happen as the result of exercising too much or doing other repetitive activities (overuse injury). Plantar fasciitis can cause mild irritation to severe pain that makes it difficult to walk or move. The pain is usually worse in the morning after sleeping, or after sitting or lying down for a period of time. Pain may also be worse after long periods of walking or standing. What are the causes? This condition may be caused by:  Standing for long periods of time.  Wearing shoes that do not have good arch support.  Doing activities that put stress on joints (high-impact activities). This includes ballet and exercise that makes your heart beat faster (aerobic exercise), such as running.  Being overweight.  An abnormal way of walking (gait).  Tight muscles in the back of your lower leg (calf).  High arches in your feet or flat feet.  Starting a new athletic activity. What are the signs or symptoms? The main symptom of this condition is heel pain. Pain may get worse after the following:  Taking the first steps after a time of rest, especially in the morning after awakening, or after you have been sitting or lying down for a while.  Long periods of standing still. Pain may decrease after 30-45 minutes of activity, such as gentle walking. How is this diagnosed? This condition may be diagnosed based on your medical history, a physical exam, and your symptoms. Your health care provider will check for:  A tender area on the bottom of your foot.  A high arch in your foot or flat feet.  Pain when you move your foot.  Difficulty moving your foot. You may have imaging tests to confirm the diagnosis, such as:  X-rays.  Ultrasound.  MRI. How is this treated? Treatment for plantar  fasciitis depends on how severe your condition is. Treatment may include:  Rest, ice, pressure (compression), and raising (elevating) the affected foot. This is called RICE therapy. Your health care provider may recommend RICE therapy along with over-the-counter pain medicines to manage your pain.  Exercises to stretch your calves and your plantar fascia.  A splint that holds your foot in a stretched, upward position while you sleep (night splint).  Physical therapy to relieve symptoms and prevent problems in the future.  Injections of steroid medicine (cortisone) to relieve pain and inflammation.  Stimulating your plantar fascia with electrical impulses (extracorporeal shock wave therapy). This is usually the last treatment option before surgery.  Surgery, if other treatments have not worked after 12 months. Follow these instructions at home: Managing pain, stiffness, and swelling  If directed, put ice on the painful area. To do this: ? Put ice in a plastic bag, or use a frozen bottle of water. ? Place a towel between your skin and the bag or bottle. ? Roll the bottom of your foot over the bag or bottle. ? Do this for 20 minutes, 2-3 times a day.  Wear athletic shoes that have air-sole or gel-sole cushions, or try soft shoe inserts that are designed for plantar fasciitis.  Elevate your foot above the level of your heart while you are sitting or lying down.   Activity  Avoid activities that cause pain. Ask your health care provider what activities are safe for you.  Do physical therapy exercises   and stretches as told by your health care provider.  Try activities and forms of exercise that are easier on your joints (low impact). Examples include swimming, water aerobics, and biking. General instructions  Take over-the-counter and prescription medicines only as told by your health care provider.  Wear a night splint while sleeping, if told by your health care provider. Loosen the  splint if your toes tingle, become numb, or turn cold and blue.  Maintain a healthy weight, or work with your health care provider to lose weight as needed.  Keep all follow-up visits. This is important. Contact a health care provider if you have:  Symptoms that do not go away with home treatment.  Pain that gets worse.  Pain that affects your ability to move or do daily activities. Summary  Plantar fasciitis is a painful foot condition that affects the heel. It occurs when the band of tissue that connects the toes to the heel bone (plantar fascia) becomes irritated.  Heel pain is the main symptom of this condition. It may get worse after exercising too much or standing still for a long time.  Treatment varies, but it usually starts with rest, ice, pressure (compression), and raising (elevating) the affected foot. This is called RICE therapy. Over-the-counter medicines can also be used to manage pain. This information is not intended to replace advice given to you by your health care provider. Make sure you discuss any questions you have with your health care provider. Document Revised: 07/01/2019 Document Reviewed: 07/01/2019 Elsevier Patient Education  2021 Elsevier Inc.  

## 2020-06-10 NOTE — Progress Notes (Signed)
Established Patient Office Visit  Subjective:  Patient ID: Shari Berger, female    DOB: [birth year]  Age: 45 y.o. MRN: [id number]  CC:  Chief Complaint  Patient presents with  . Back Pain    Lower back  . Foot Pain    Left foot worse    HPI [name] is a 45 year old obese female who presents for low back pain-rates pain 9 out of 10 at bedtime when she is trying to go to sleep.  And she is also concerned with left foot pain.  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  Her right foot has a burning sensation on the bottom with cramping in her foot.  Past Medical History:  Diagnosis Date  . Acid reflux    ocassional  . Anemia   . Cluster headache   . Fibroid   . Migraines     Past Surgical History:  Procedure Laterality Date  . ABDOMINAL HYSTERECTOMY N/A 12/24/2019   Procedure: HYSTERECTOMY ABDOMINAL;  Surgeon: Osborne Oman, MD;  Location: [facility];  Service: Gynecology;  Laterality: N/A;  . LAPAROSCOPY N/A 12/24/2019   Procedure: diagnostic laparoscopy;  Surgeon: Osborne Oman, MD;  Location: [facility];  Service: Gynecology;  Laterality: N/A;  . WISDOM TOOTH EXTRACTION      Family History  Problem Relation Age of Onset  . Depression Mother   . Heart disease Father   . Diabetes Father   . Hypertension Father     Social History   Socioeconomic History  . Marital status: Single    Spouse name: Not on file  . Number of children: Not on file  . Years of education: Not on file  . Highest education level: 12th grade  Occupational History  . Not on file  Tobacco Use  . Smoking status: Never Smoker  . Smokeless tobacco: Never Used  Vaping Use  . Vaping Use: Former  Substance and Sexual Activity  . Alcohol use: Yes    Comment: ocassional  . Drug use: No  . Sexual activity: Yes    Partners: Male    Birth control/protection: None  Other Topics Concern  . Not on file  Social History  Narrative  . Not on file   Social Determinants of Health   Financial Resource Strain: Not on file  Food Insecurity: No Food Insecurity  . Worried About Charity fundraiser in the Last Year: Never true  . Ran Out of Food in the Last Year: Never true  Transportation Needs: No Transportation Needs  . Lack of Transportation (Medical): No  . Lack of Transportation (Non-Medical): No  Physical Activity: Not on file  Stress: Not on file  Social Connections: Not on file  Intimate Partner Violence: Not on file    Outpatient Medications Prior to Visit  Medication Sig Dispense Refill  . acetaminophen (TYLENOL) 500 MG tablet Take 2 tablets (1,000 mg total) by mouth every 6 (six) hours. 30 tablet 2  . docusate sodium (COLACE) 100 MG capsule Take 1 capsule (100 mg total) by mouth 2 (two) times daily. (Patient not taking: Reported on 02/06/2020) 30 capsule 1  . ibuprofen (ADVIL) 800 MG tablet Take 1 tablet (800 mg total) by mouth 3 (three) times daily with meals as needed for mild pain, moderate pain or cramping. (Patient not taking: Reported on 02/06/2020) 30 tablet 2  . oxyCODONE (OXY IR/ROXICODONE) 5 MG  immediate release tablet Take 1 tablet (5 mg total) by mouth every 4 (four) hours as needed for severe pain or breakthrough pain. (Patient not taking: Reported on 01/08/2020) 30 tablet 0   No facility-administered medications prior to visit.    No Known Allergies  ROS Review of Systems  HENT: Positive for congestion and postnasal drip.   Eyes: Positive for itching.  Musculoskeletal: Positive for back pain.       Left foot pain  Allergic/Immunologic:       Seasonal allergies   Neurological: Positive for headaches.       With allergies   All other systems reviewed and are negative.     Objective:    BP 126/84 (BP Location: Right Arm, Patient Position: Sitting, Cuff Size: Large)   Pulse 88   Temp 97.7 F (36.5 C) (Temporal)   Ht 5\' 5"  (1.651 m)   Wt 234 lb 9.6 oz (106.4 kg)   LMP  12/18/2019   SpO2 96%   BMI 39.04 kg/m  Physical Exam Vitals reviewed.  Constitutional:      Appearance: She is obese.  HENT:     Head: Normocephalic.     Right Ear: Tympanic membrane and external ear normal.     Left Ear: Tympanic membrane and external ear normal.     Nose: Nose normal.  Eyes:     Extraocular Movements: Extraocular movements intact.  Cardiovascular:     Rate and Rhythm: Normal rate and regular rhythm.  Pulmonary:     Effort: Pulmonary effort is normal.     Breath sounds: Normal breath sounds.  Abdominal:     General: Bowel sounds are normal. There is distension.     Palpations: Abdomen is soft.  Musculoskeletal:        General: Normal range of motion.     Cervical back: Normal range of motion and neck supple.  Skin:    General: Skin is warm and dry.  Neurological:     Mental Status: She is alert and oriented to person, place, and time.  Psychiatric:        Mood and Affect: Mood normal.        Behavior: Behavior normal.        Thought Content: Thought content normal.        Judgment: Judgment normal.    Health Maintenance Due  Topic Date Due  . COVID-19 Vaccine (3 - Booster for Pfizer series) 01/10/2020    No results found for: TSH Lab Results  Component Value Date   WBC 6.6 06/10/2020   HGB 12.6 06/10/2020   HCT 39.5 06/10/2020   MCV 82 06/10/2020   PLT 310 06/10/2020   Lab Results  Component Value Date   NA 136 02/07/2020   K 3.9 02/07/2020   CO2 20 (L) 02/07/2020   GLUCOSE 95 02/07/2020   BUN 12 02/07/2020   CREATININE 0.77 02/07/2020   CREATININE 0.71 02/07/2020   BILITOT 0.7 02/06/2020   ALKPHOS 53 02/06/2020   AST 11 (L) 02/06/2020   ALT 17 02/06/2020   PROT 6.5 02/06/2020   ALBUMIN 2.8 (L) 02/06/2020   CALCIUM 8.3 (L) 02/07/2020   ANIONGAP 8 02/07/2020      Assessment & Plan:  Shari Berger was seen today for back pain and foot pain.  Diagnoses and all orders for this visit:  Screening for diabetes mellitus -     HgB A1c  5.2 per ADA guidelines she is not a diabetic  Plantar fasciitis Explained  To freeze a water bottle and roll under the bottom of foot this may cause some pain relief -     Ambulatory referral to Orthopedic Surgery  Chronic bilateral low back pain without sciatica BACK PAIN   Location: lumbar/sacral (right side only)  Quality: aching and sharp Onset: rapid, gradual, waxing and waning Worse with: laying down       Better with: unknown  Radiation: yes  Trauma:no Best sitting/standing/leaning forward:   Red Flags Fecal/urinary incontinence: no  Numbness/Weakness: no  Fever/chills/sweats: no  Night pain: yes  Unexplained weight loss: no  No relief with bedrest: no  h/o cancer/immunosuppression: no  IV drug use: no  PMH of osteoporosis or chronic steroid use: no  -     Ambulatory referral to Orthopedic Surgery  Class 2 obesity due to excess calories without serious comorbidity with body mass index (BMI) of 39.0 to 39.9 in adult Obesity is 30-39 indicating an excess in caloric intake or underlining conditions. This may lead to other co-morbidities.Increase risk for DM, HTN and respiratory problem.  Lifestyle modifications of diet and exercise may reduce obesity. Decease carbs and continue to exercise   Iron deficiency anemia, unspecified iron deficiency anemia type -     Ferritin; Future -     B12 and Folate Panel -     CBC with Differential  Seasonal allergies With changes in the season grass mowing trees and flowers blooming weather changing from hot to cold.  Prescribed antihistamine and Flonase take as needed  Follow-up: Return in about 3 months (around 09/10/2020) for anemia.    Kerin Perna, NP

## 2020-06-11 LAB — CBC WITH DIFFERENTIAL/PLATELET
Basophils Absolute: 0 10*3/uL (ref 0.0–0.2)
Basos: 1 %
EOS (ABSOLUTE): 0.3 10*3/uL (ref 0.0–0.4)
Eos: 5 %
Hematocrit: 39.5 % (ref 34.0–46.6)
Hemoglobin: 12.6 g/dL (ref 11.1–15.9)
Immature Grans (Abs): 0 10*3/uL (ref 0.0–0.1)
Immature Granulocytes: 0 %
Lymphocytes Absolute: 2 10*3/uL (ref 0.7–3.1)
Lymphs: 30 %
MCH: 26 pg — ABNORMAL LOW (ref 26.6–33.0)
MCHC: 31.9 g/dL (ref 31.5–35.7)
MCV: 82 fL (ref 79–97)
Monocytes Absolute: 0.6 10*3/uL (ref 0.1–0.9)
Monocytes: 8 %
Neutrophils Absolute: 3.7 10*3/uL (ref 1.4–7.0)
Neutrophils: 56 %
Platelets: 310 10*3/uL (ref 150–450)
RBC: 4.84 x10E6/uL (ref 3.77–5.28)
RDW: 18.2 % — ABNORMAL HIGH (ref 11.7–15.4)
WBC: 6.6 10*3/uL (ref 3.4–10.8)

## 2020-06-11 LAB — LIPID PANEL
Chol/HDL Ratio: 4.5 ratio — ABNORMAL HIGH (ref 0.0–4.4)
Cholesterol, Total: 196 mg/dL (ref 100–199)
HDL: 44 mg/dL (ref >39)
LDL Chol Calc (NIH): 137 mg/dL — ABNORMAL HIGH (ref 0–99)
Triglycerides: 81 mg/dL (ref 0–149)
VLDL Cholesterol Cal: 15 mg/dL (ref 5–40)

## 2020-06-11 LAB — B12 AND FOLATE PANEL
Folate: 7.4 ng/mL (ref >3.0)
Vitamin B-12: 1018 pg/mL (ref 232–1245)

## 2020-06-17 ENCOUNTER — Other Ambulatory Visit: Payer: Self-pay

## 2020-06-17 ENCOUNTER — Ambulatory Visit (INDEPENDENT_AMBULATORY_CARE_PROVIDER_SITE_OTHER): Payer: Medicaid Other | Admitting: Family Medicine

## 2020-06-17 ENCOUNTER — Encounter: Payer: Self-pay | Admitting: Family Medicine

## 2020-06-17 DIAGNOSIS — M5441 Lumbago with sciatica, right side: Secondary | ICD-10-CM

## 2020-06-17 DIAGNOSIS — M25572 Pain in left ankle and joints of left foot: Secondary | ICD-10-CM

## 2020-06-17 DIAGNOSIS — G8929 Other chronic pain: Secondary | ICD-10-CM | POA: Diagnosis not present

## 2020-06-17 NOTE — Progress Notes (Signed)
Office Visit Note   Patient: Shari Berger           Date of Birth: 21-May-1975           MRN: [id number] Visit Date: 06/17/2020 Requested by: Kerin Perna, NP [address] [place],  [place] [address] PCP: Kerin Perna, NP  Subjective: Chief Complaint  Patient presents with  . Lower Back - Pain    Right-sided lower back pain - "there's a knot back there." Occasional sharp pain in the right side of the back, that will run down the back of the leg, along with numbness. After her visit here for her back in March 2021, she did a few sessions of PT and then just did HEP comprised of stretching exercises. She stands all day at work - she wonders if this is irritating her back.  . Left Foot - Pain    Pain all over the foot, but mainly plantar and medial. Tender to touch. Has been having cramps in the foot 6 months ago. Hurts when she first stands up.    HPI: She is here with persistent low back pain.  She did 3 sessions of physical therapy followed by home exercises.  Her pain has not gone away.  She has a tender knot in the right lumbar area with radiation of pain down the right leg.  She also continues to have left foot pain.  Pain across the midfoot primarily.  It hurts to bear weight, and now it is tender to touch.  She uses ibuprofen with some relief.              ROS:   All other systems were reviewed and are negative.  Objective: Vital Signs: LMP 12/18/2019   Physical Exam:  General:  Alert and oriented, in no acute distress. Pulm:  Breathing unlabored. Psy:  Normal mood, congruent affect  Low back: She has a subcutaneous nodule in the right lumbar area that feels either like a joint mouse or a trigger point.  No pain in the sciatic notch, no midline tenderness.  Straight leg raise negative, lower extremity strength and reflexes remain normal. Left foot: No effusion in the ankle joint, no swelling in the foot itself.  She does have some tenderness around the  midfoot and pain with passive manipulation of it.  No point tenderness to suggest a stress fracture.  No tenderness at the plantar fascia origin.    Imaging: No results found.  Assessment & Plan: 1.  Chronic low back pain with right-sided radicular symptoms -MRI to further evaluate.  Could contemplate referral for injection or possibly chiropractic depending on the findings.  2.  Chronic left midfoot pain -We will order x-rays of her foot.     Procedures: No procedures performed        PMFS History: Patient Active Problem List   Diagnosis Date Noted  . Pneumoperitoneum of unknown etiology 02/06/2020  . S/P TAH (total abdominal hysterectomy) 12/24/2019  . Acute postoperative anemia due to expected blood loss 12/24/2019  . Anemia due to chronic blood loss 12/19/2019  . Fibroids 12/19/2019  . Abnormal uterine bleeding (AUB) 02/01/2019  . Tendonitis of foot 04/12/2017  . Pelvic pain 04/12/2017  . Migraines 04/11/2017  . Acid reflux 04/11/2017   Past Medical History:  Diagnosis Date  . Acid reflux    ocassional  . Anemia   . Cluster headache   . Fibroid   . Migraines     Family History  Problem Relation Age of Onset  . Depression Mother   . Heart disease Father   . Diabetes Father   . Hypertension Father     Past Surgical History:  Procedure Laterality Date  . ABDOMINAL HYSTERECTOMY N/A 12/24/2019   Procedure: HYSTERECTOMY ABDOMINAL;  Surgeon: Osborne Oman, MD;  Location: [facility];  Service: Gynecology;  Laterality: N/A;  . LAPAROSCOPY N/A 12/24/2019   Procedure: diagnostic laparoscopy;  Surgeon: Osborne Oman, MD;  Location: [facility];  Service: Gynecology;  Laterality: N/A;  . WISDOM TOOTH EXTRACTION     Social History   Occupational History  . Not on file  Tobacco Use  . Smoking status: Never Smoker  . Smokeless tobacco: Never Used  Vaping Use  . Vaping Use: Former  Substance and Sexual Activity  . Alcohol use: Yes    Comment: ocassional  . Drug  use: No  . Sexual activity: Yes    Partners: Male    Birth control/protection: None

## 2020-07-06 ENCOUNTER — Ambulatory Visit
Admission: RE | Admit: 2020-07-06 | Discharge: 2020-07-06 | Disposition: A | Discharge status: Home / Self Care | Payer: Medicaid Other | Source: Ambulatory Visit | Attending: Family Medicine | Admitting: Family Medicine

## 2020-07-06 ENCOUNTER — Other Ambulatory Visit: Payer: Self-pay

## 2020-07-06 DIAGNOSIS — G8929 Other chronic pain: Secondary | ICD-10-CM

## 2020-07-07 ENCOUNTER — Telehealth: Payer: Self-pay | Admitting: Family Medicine

## 2020-07-07 NOTE — Telephone Encounter (Signed)
MRI shows a small disc protrusion at L5-S1, but it is not compressing any nerves.  No abnormalities seen in the soft tissues, specifically, no tumors or growths.

## 2020-07-09 ENCOUNTER — Other Ambulatory Visit (INDEPENDENT_AMBULATORY_CARE_PROVIDER_SITE_OTHER): Payer: Self-pay | Admitting: Primary Care

## 2020-07-09 DIAGNOSIS — G8929 Other chronic pain: Secondary | ICD-10-CM

## 2020-07-09 DIAGNOSIS — M722 Plantar fascial fibromatosis: Secondary | ICD-10-CM

## 2020-07-09 NOTE — Telephone Encounter (Signed)
Future visit in 2 months  

## 2020-08-01 IMAGING — US US  BREAST BX W/ LOC DEV 1ST LESION IMG BX SPEC US GUIDE*R*
1 series · 12 of 12 positions shown · non-contrast
Comparison: Previous exam(s).
COMPARISON: Previous exam(s).

Addendum:
CLINICAL DATA: 43-year-old female presenting for ultrasound-guided
biopsy of a right breast mass.

EXAM:
ULTRASOUND GUIDED RIGHT BREAST CORE NEEDLE BIOPSY

[Series 1: us breast bx w/ loc dev 1st lesion img bx spec us  · 0.08mm/px · 12 of 12 slices shown]
[im 1/12]
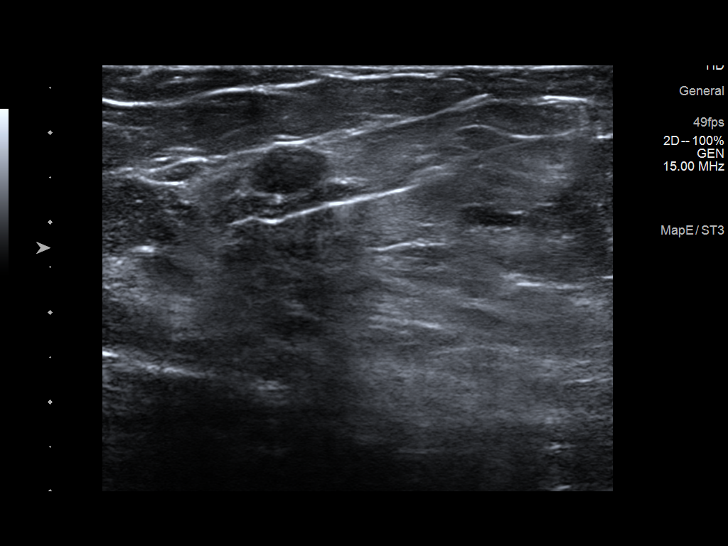
[im 2/12]
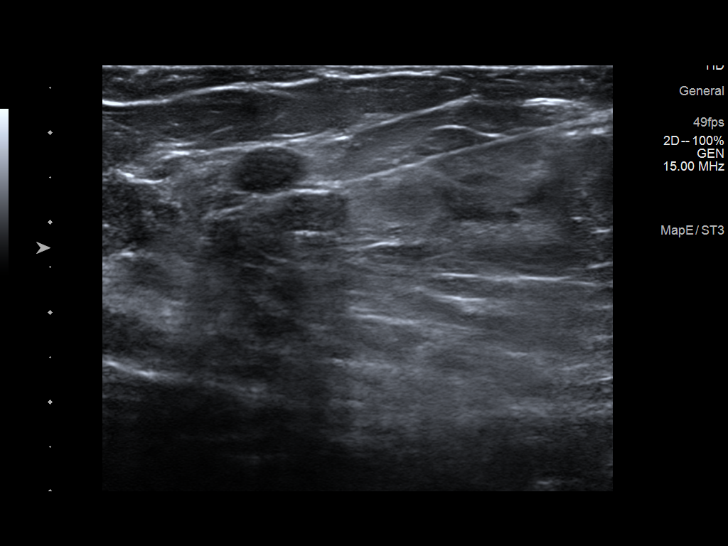
[im 3/12]
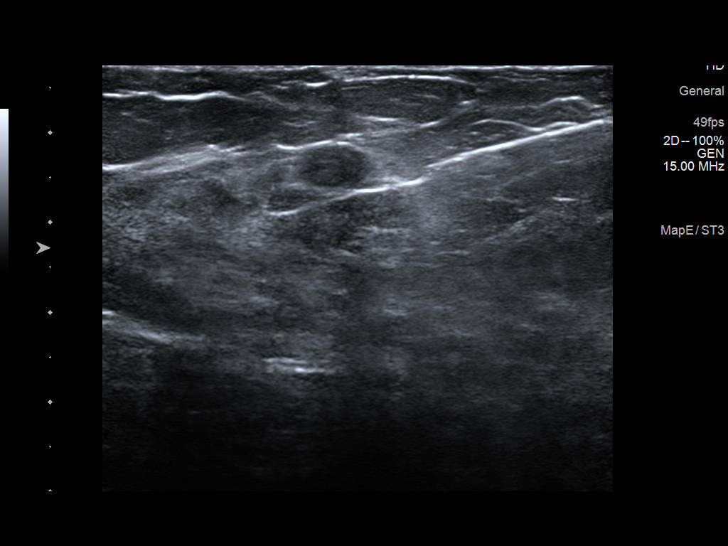
[im 4/12]
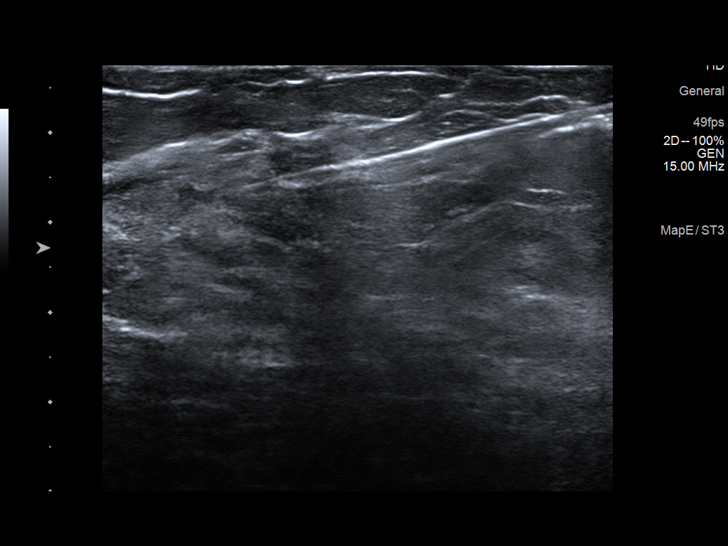
[im 5/12]
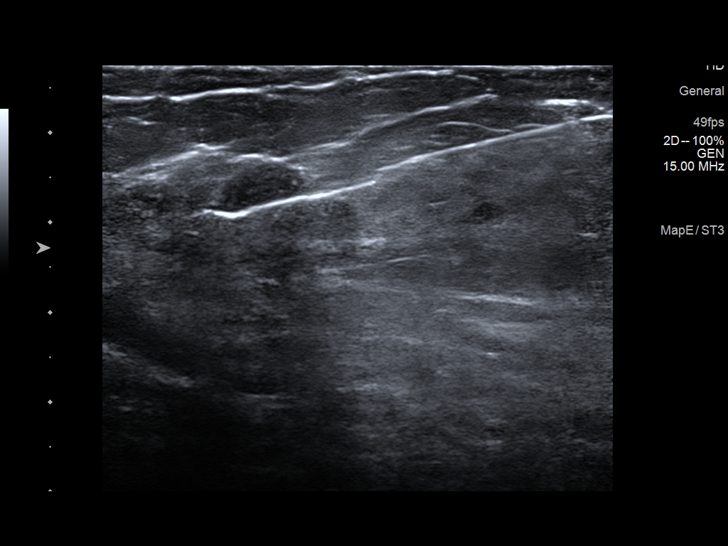
[im 6/12]
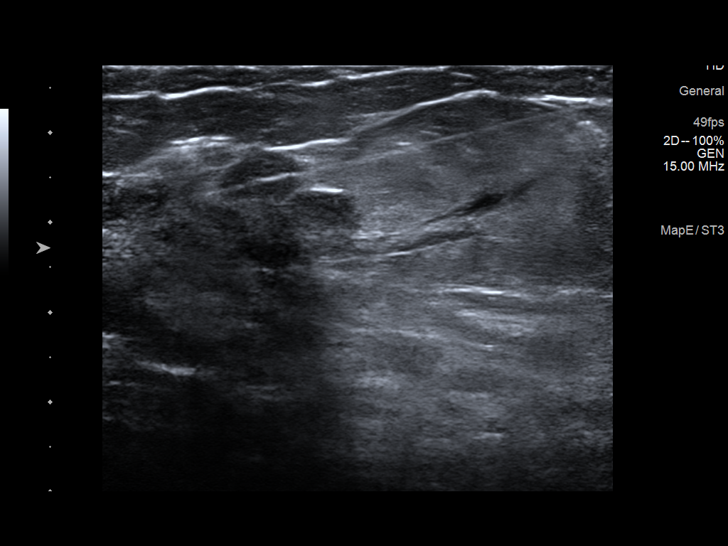
[im 7/12]
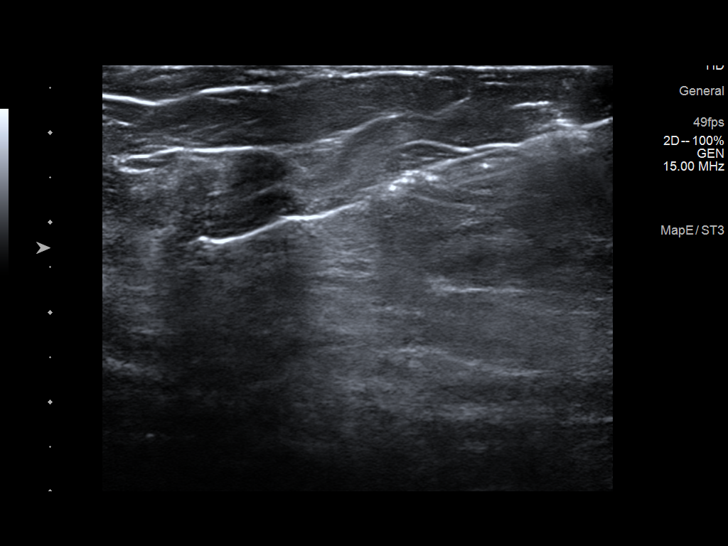
[im 8/12]
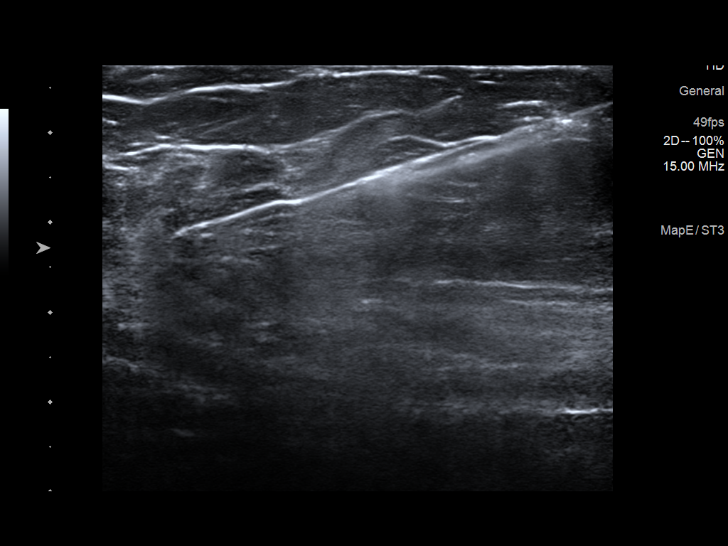
[im 9/12]
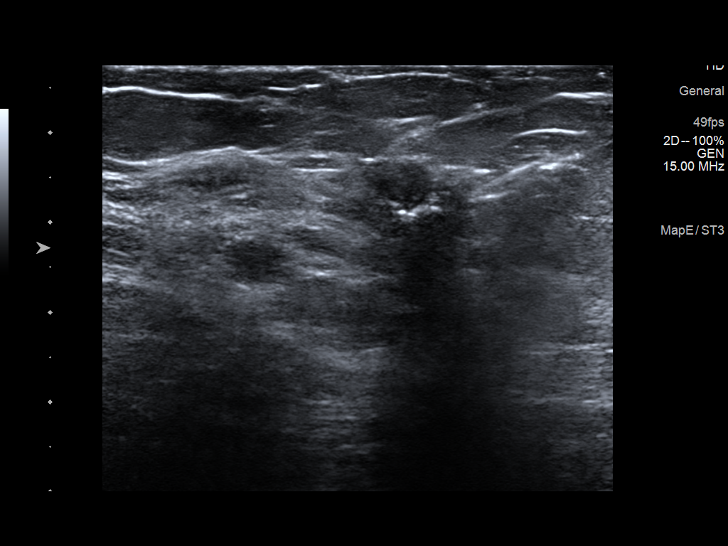
[im 10/12]
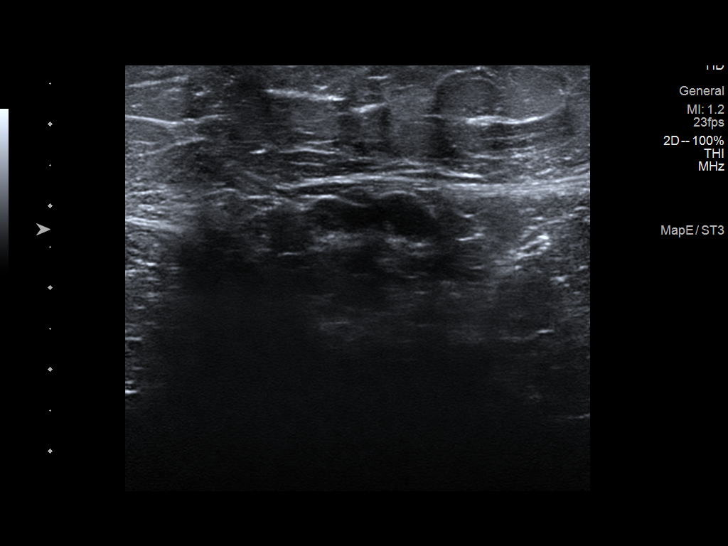
[im 11/12]
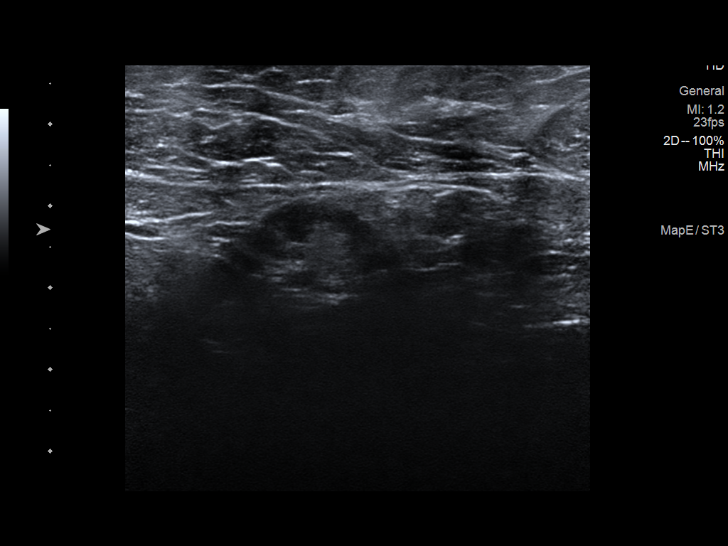
[im 12/12]
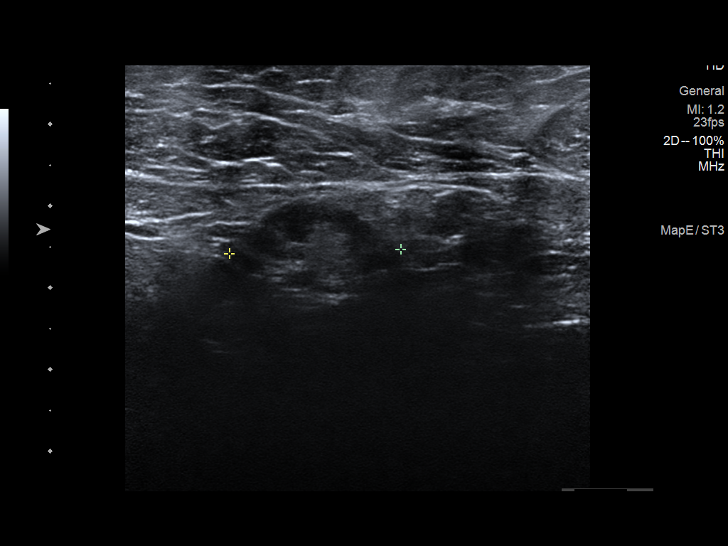

[12 of 12 positions shown; findings below may reference images not displayed]



#1 Lesion quadrant: Upper inner quadrant

Using sterile technique and 1% Lidocaine as local anesthetic, under
direct ultrasound visualization, a 14 gauge Rosalee device was
used to perform biopsy of a mass in the right breast at 1 o'clock
using an inferior approach. At the conclusion of the procedure a
ribbon shaped tissue marker clip was deployed into the biopsy
cavity.

The right axilla was scanned in preparation for the lymph node
biopsy, however the appearance of the lymph node from the diagnostic
exam could not be reproduced. The original sonographer who scanned
her axilla on the diagnostic exam was asked to rescan the axilla as
well and could not reproduce that same appearance. She does have
prominent lymph nodes in her right axilla, however they are
symmetric to the contralateral axilla. Therefore, the axillary lymph
node biopsy was canceled.

Follow up 2 view mammogram was performed and dictated separately.
IMPRESSION: 1. Ultrasound guided biopsy of a right breast mass at 1 o'clock. No
apparent complications.

2. The biopsy of the right axillary lymph node was canceled. See
discussion above.

ADDENDUM:
Pathology revealed FIBROADENOMA of the RIGHT breast, 1 o'clock,
mass. This was found to be concordant by Dr. Katalin Helga Dembrovszki.

Pathology results were discussed with the patient by telephone. The
patient reported doing well after the biopsy with tenderness at the
site. Post biopsy instructions and care were reviewed and questions
were answered. The patient was encouraged to call The [REDACTED]

The patient was instructed to return for annual screening
mammography and informed a reminder notice would be sent regarding
this appointment.

Pathology results reported by Nargis Bedoya RN on 05/08/2019.



#1 Lesion quadrant: Upper inner quadrant

Using sterile technique and 1% Lidocaine as local anesthetic, under
direct ultrasound visualization, a 14 gauge Rosalee device was
used to perform biopsy of a mass in the right breast at 1 o'clock
using an inferior approach. At the conclusion of the procedure a
ribbon shaped tissue marker clip was deployed into the biopsy
cavity.

The right axilla was scanned in preparation for the lymph node
biopsy, however the appearance of the lymph node from the diagnostic
exam could not be reproduced. The original sonographer who scanned
her axilla on the diagnostic exam was asked to rescan the axilla as
well and could not reproduce that same appearance. She does have
prominent lymph nodes in her right axilla, however they are
symmetric to the contralateral axilla. Therefore, the axillary lymph
node biopsy was canceled.

Follow up 2 view mammogram was performed and dictated separately.
IMPRESSION: 1. Ultrasound guided biopsy of a right breast mass at 1 o'clock. No
apparent complications.

2. The biopsy of the right axillary lymph node was canceled. See
discussion above.

## 2020-08-01 IMAGING — MG MM BREAST LOCALIZATION CLIP
4 series · 4 of 12 positions shown · non-contrast
Comparison: Previous exam(s).

CLINICAL DATA: Post biopsy mammogram of the right breast for clip
placement.

EXAM:
DIAGNOSTIC RIGHT MAMMOGRAM POST ULTRASOUND BIOPSY

[R CC synth-2D]
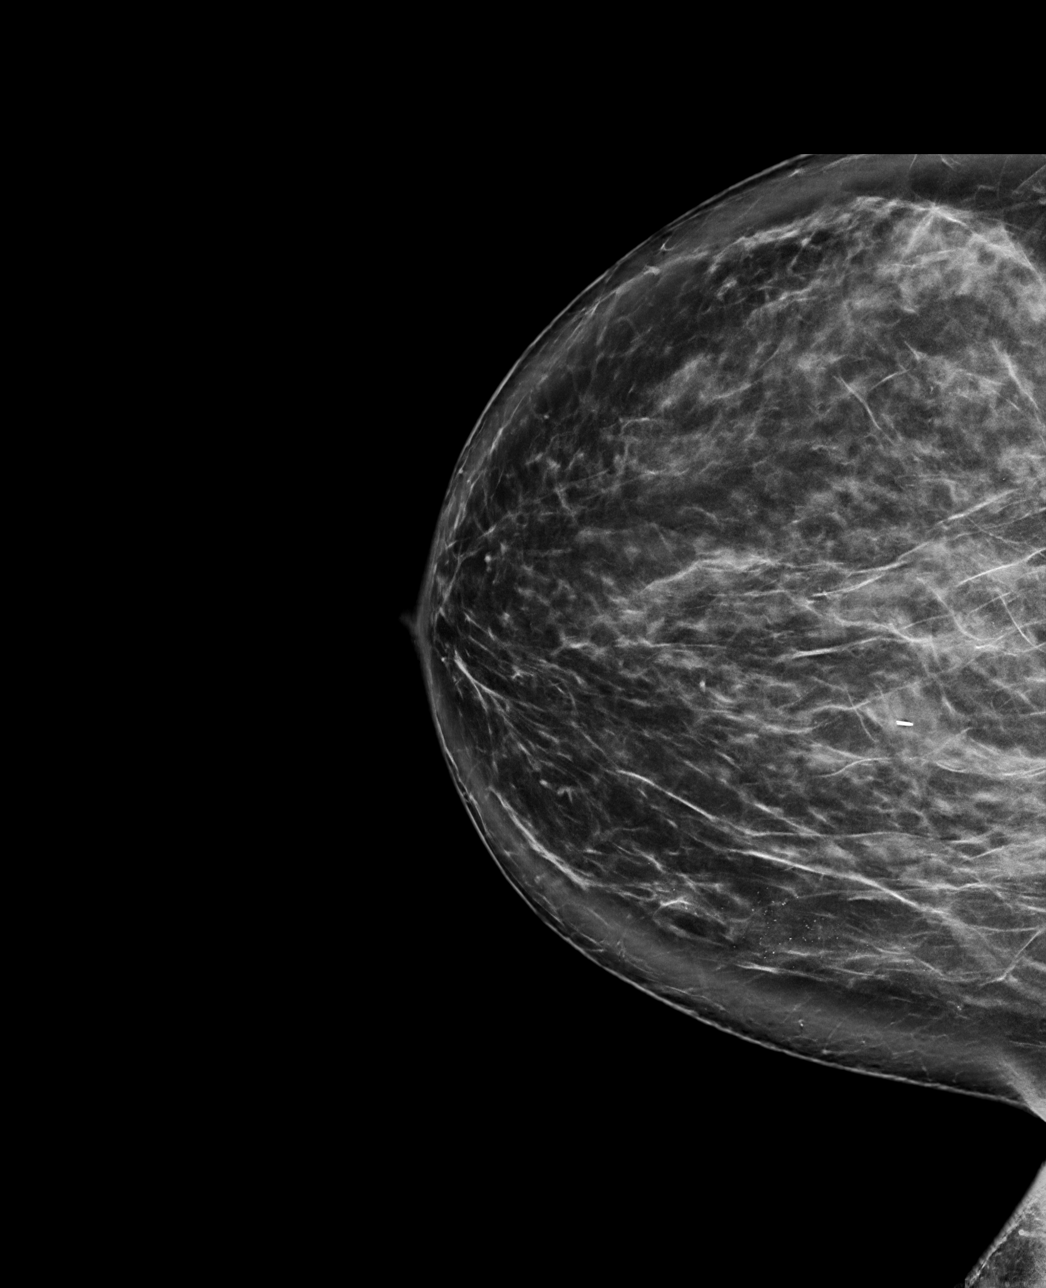

[R ML synth-2D]
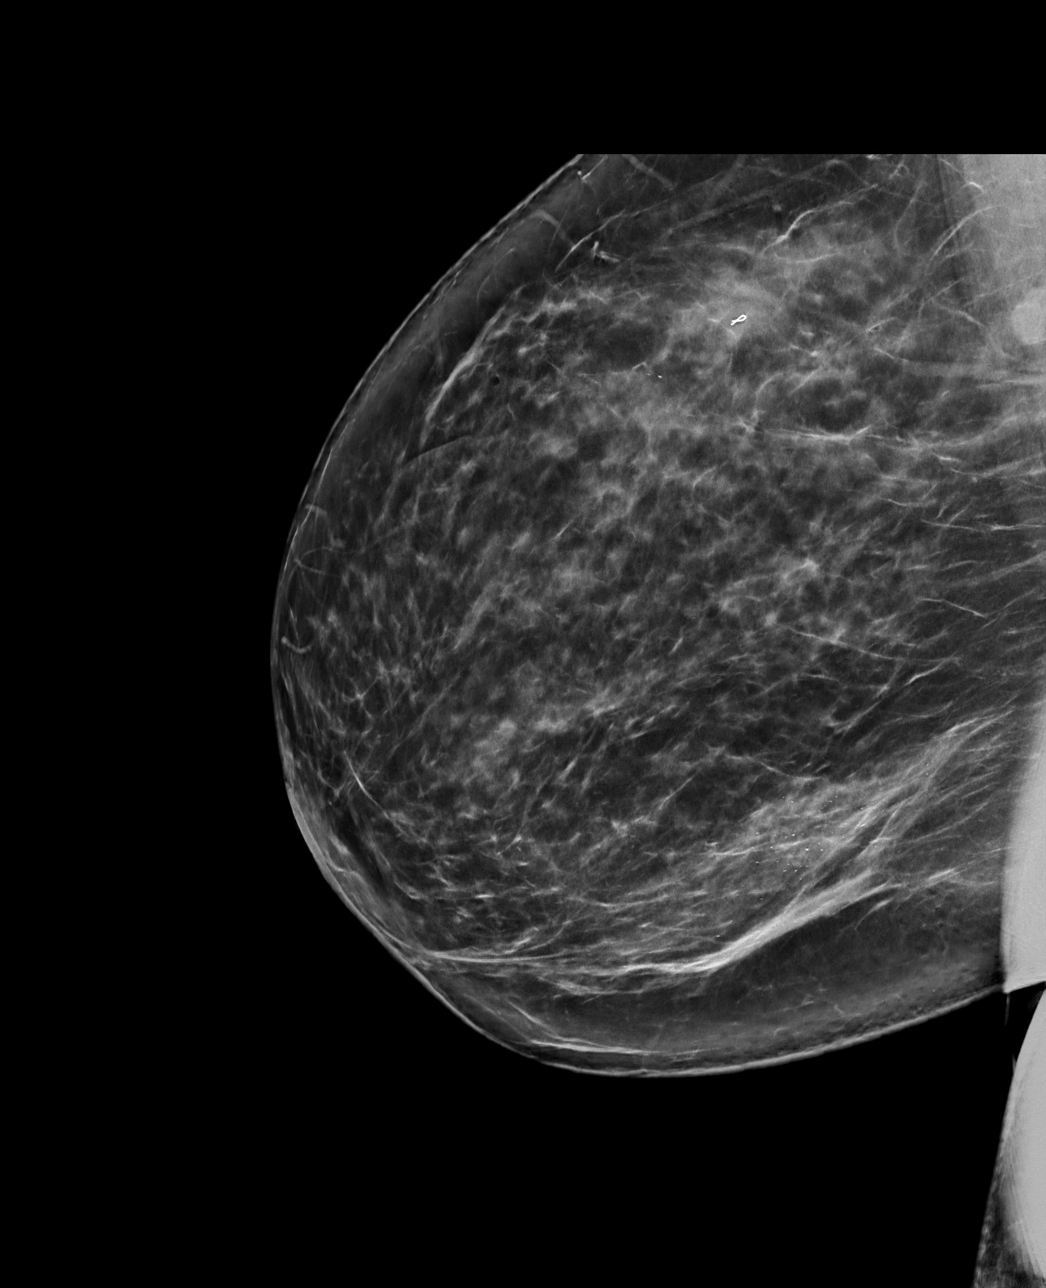

[R ML tomo · tomo slice 49/98.0]
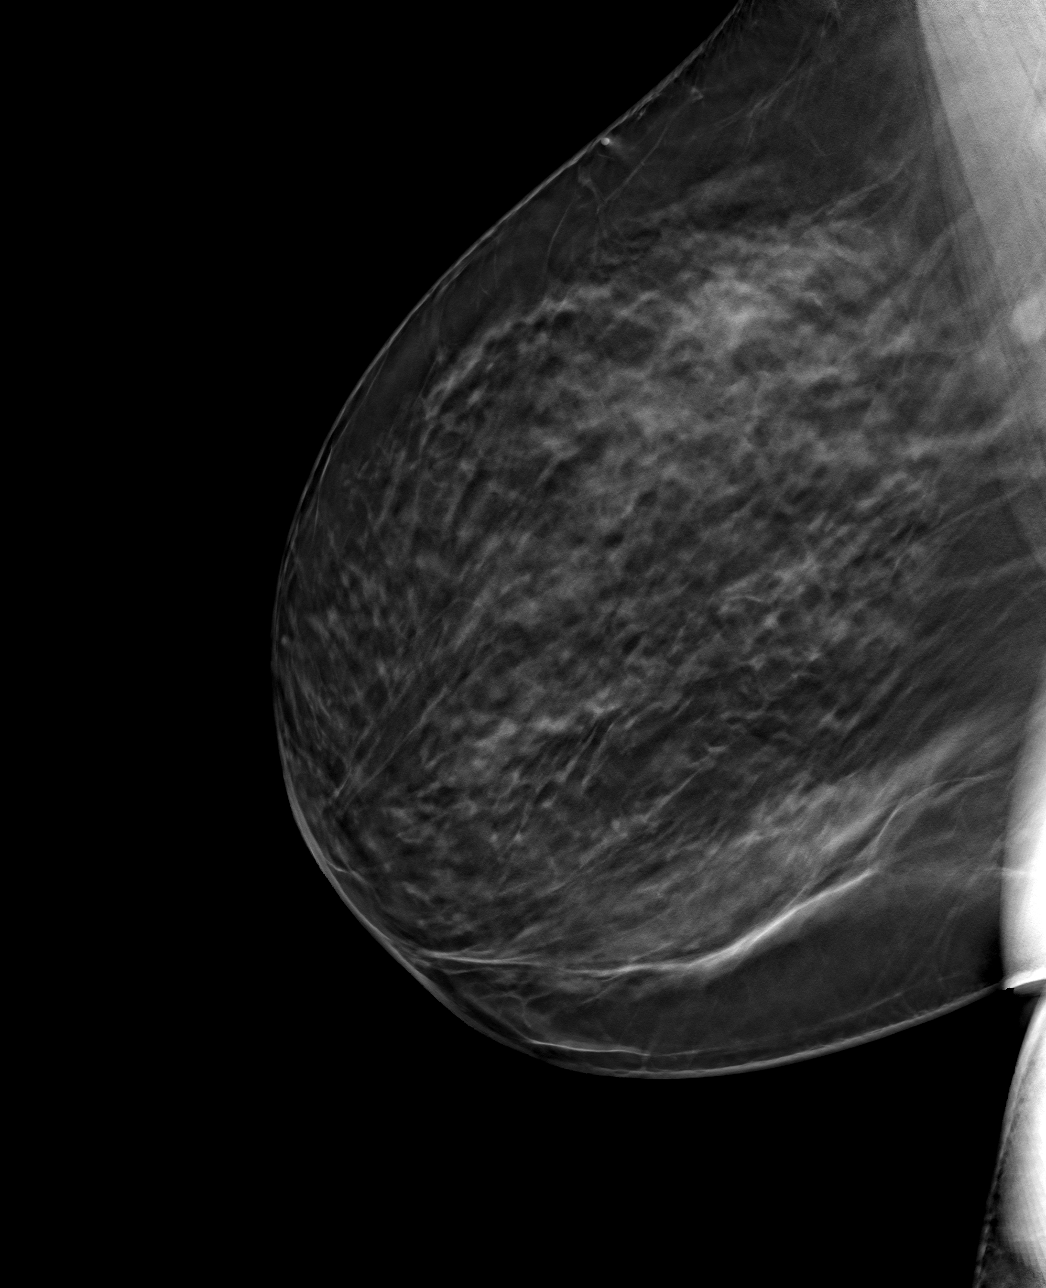

[R CC tomo · tomo slice 51/101.0]
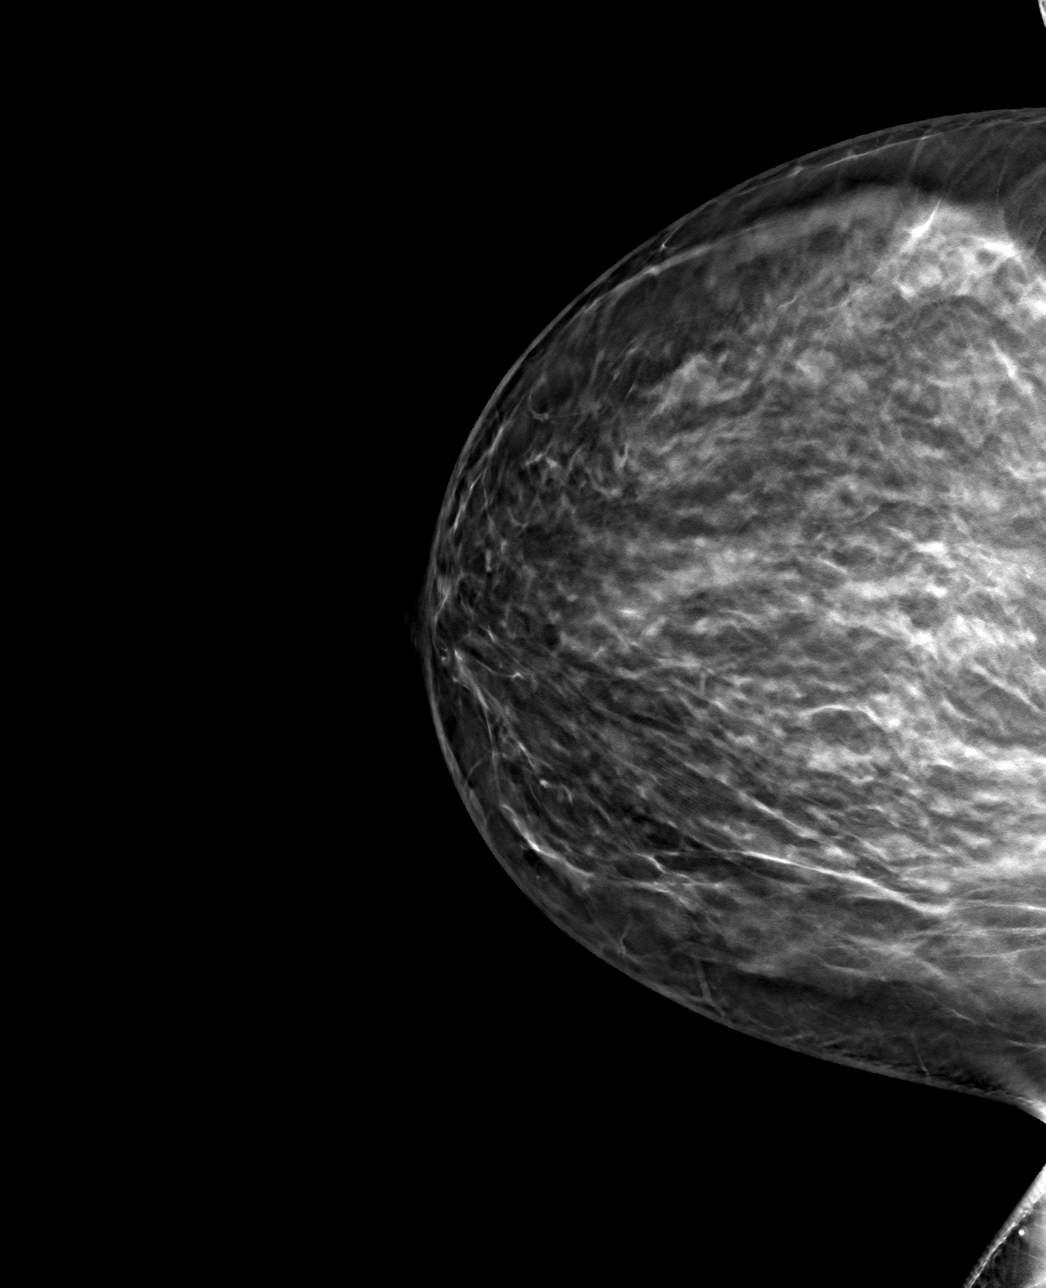

[4 of 12 positions shown; findings below may reference images not displayed]

FINDINGS: Mammographic images were obtained following ultrasound guided biopsy
of a mass in the right breast at 1 o'clock. The biopsy marking clip
is in expected position at the site of biopsy.
IMPRESSION: Appropriate positioning of the ribbon shaped biopsy marking clip at
the site of biopsy in the upper inner right breast.

Final Assessment: Post Procedure Mammograms for Marker Placement

## 2020-08-22 ENCOUNTER — Other Ambulatory Visit: Payer: Self-pay

## 2020-08-22 ENCOUNTER — Encounter (HOSPITAL_COMMUNITY): Payer: Self-pay | Admitting: Emergency Medicine

## 2020-08-22 ENCOUNTER — Emergency Department (HOSPITAL_COMMUNITY)
Admission: EM | Admit: 2020-08-22 | Discharge: 2020-08-22 | Disposition: A | Discharge status: Home / Self Care | Payer: Medicaid Other | Attending: Emergency Medicine | Admitting: Emergency Medicine

## 2020-08-22 DIAGNOSIS — Z5321 Procedure and treatment not carried out due to patient leaving prior to being seen by health care provider: Secondary | ICD-10-CM | POA: Insufficient documentation

## 2020-08-22 DIAGNOSIS — M545 Low back pain, unspecified: Secondary | ICD-10-CM | POA: Diagnosis not present

## 2020-08-22 LAB — URINALYSIS, ROUTINE W REFLEX MICROSCOPIC
Bilirubin Urine: NEGATIVE
Glucose, UA: NEGATIVE mg/dL
Hgb urine dipstick: NEGATIVE
Ketones, ur: NEGATIVE mg/dL
Leukocytes,Ua: NEGATIVE
Nitrite: NEGATIVE
Protein, ur: NEGATIVE mg/dL
Specific Gravity, Urine: 1.023 (ref 1.005–1.030)
pH: 6 (ref 5.0–8.0)

## 2020-08-22 LAB — BASIC METABOLIC PANEL
Anion gap: 6 (ref 5–15)
BUN: 11 mg/dL (ref 6–20)
CO2: 23 mmol/L (ref 22–32)
Calcium: 8.7 mg/dL — ABNORMAL LOW (ref 8.9–10.3)
Chloride: 108 mmol/L (ref 98–111)
Creatinine, Ser: 0.73 mg/dL (ref 0.44–1.00)
GFR, Estimated: >60 mL/min (ref >60)
Glucose, Bld: 103 mg/dL — ABNORMAL HIGH (ref 70–99)
Potassium: 3.6 mmol/L (ref 3.5–5.1)
Sodium: 137 mmol/L (ref 135–145)

## 2020-08-22 LAB — CBC WITH DIFFERENTIAL/PLATELET
Abs Immature Granulocytes: 0.04 10*3/uL (ref 0.00–0.07)
Basophils Absolute: 0.1 10*3/uL (ref 0.0–0.1)
Basophils Relative: 1 %
Eosinophils Absolute: 0.2 10*3/uL (ref 0.0–0.5)
Eosinophils Relative: 3 %
HCT: 39.3 % (ref 36.0–46.0)
Hemoglobin: 13 g/dL (ref 12.0–15.0)
Immature Granulocytes: 1 %
Lymphocytes Relative: 23 %
Lymphs Abs: 2 10*3/uL (ref 0.7–4.0)
MCH: 27.5 pg (ref 26.0–34.0)
MCHC: 33.1 g/dL (ref 30.0–36.0)
MCV: 83.3 fL (ref 80.0–100.0)
Monocytes Absolute: 0.8 10*3/uL (ref 0.1–1.0)
Monocytes Relative: 9 %
Neutro Abs: 5.7 10*3/uL (ref 1.7–7.7)
Neutrophils Relative %: 63 %
Platelets: 290 10*3/uL (ref 150–400)
RBC: 4.72 MIL/uL (ref 3.87–5.11)
RDW: 14.4 % (ref 11.5–15.5)
WBC: 8.8 10*3/uL (ref 4.0–10.5)
nRBC: 0 % (ref 0.0–0.2)

## 2020-08-22 NOTE — ED Triage Notes (Signed)
Patient reports left lower back pain onset yesterday , denies injury , no hematuria or urinary discomfort .Pain increases with movement/changing position.

## 2020-08-24 ENCOUNTER — Emergency Department (HOSPITAL_COMMUNITY)
Admission: EM | Admit: 2020-08-24 | Discharge: 2020-08-24 | Disposition: A | Discharge status: Home / Self Care | Payer: Medicaid Other | Attending: Emergency Medicine | Admitting: Emergency Medicine

## 2020-08-24 ENCOUNTER — Encounter (HOSPITAL_COMMUNITY): Payer: Self-pay | Admitting: *Deleted

## 2020-08-24 ENCOUNTER — Other Ambulatory Visit: Payer: Self-pay

## 2020-08-24 DIAGNOSIS — M549 Dorsalgia, unspecified: Secondary | ICD-10-CM | POA: Diagnosis present

## 2020-08-24 DIAGNOSIS — G8929 Other chronic pain: Secondary | ICD-10-CM | POA: Insufficient documentation

## 2020-08-24 DIAGNOSIS — M5432 Sciatica, left side: Secondary | ICD-10-CM

## 2020-08-24 DIAGNOSIS — M79652 Pain in left thigh: Secondary | ICD-10-CM | POA: Diagnosis not present

## 2020-08-24 DIAGNOSIS — M5430 Sciatica, unspecified side: Secondary | ICD-10-CM | POA: Insufficient documentation

## 2020-08-24 MED ORDER — MELOXICAM 7.5 MG PO TABS
7.5000 mg | ORAL_TABLET | ORAL | Status: AC
  Administered 2020-08-24: 7.5 mg via ORAL
  Filled 2020-08-24: qty 1

## 2020-08-24 MED ORDER — PREDNISONE 20 MG PO TABS: ORAL_TABLET | ORAL | 0 refills | Status: DC

## 2020-08-24 MED ORDER — DEXAMETHASONE SODIUM PHOSPHATE 4 MG/ML IJ SOLN: 4.0000 mg | Freq: Once | INTRAMUSCULAR | Status: DC

## 2020-08-24 MED ORDER — DICLOFENAC SODIUM ER 100 MG PO TB24: 100.0000 mg | ORAL_TABLET | Freq: Every day | ORAL | 0 refills | Status: DC

## 2020-08-24 MED ORDER — LIDOCAINE 5 % EX PTCH
2.0000 | MEDICATED_PATCH | CUTANEOUS | Status: DC
  Administered 2020-08-24: 2 via TRANSDERMAL
  Filled 2020-08-24 (×2): qty 2

## 2020-08-24 MED ORDER — PREDNISONE 20 MG PO TABS
60.0000 mg | ORAL_TABLET | Freq: Once | ORAL | Status: AC
  Administered 2020-08-24: 60 mg via ORAL
  Filled 2020-08-24: qty 3

## 2020-08-24 MED ORDER — LIDOCAINE 5 % EX PTCH: 1.0000 | MEDICATED_PATCH | CUTANEOUS | 0 refills | Status: DC

## 2020-08-24 NOTE — ED Triage Notes (Signed)
The pt is c/o lower back pain with  The pain radiating  Down her lt leg since Thursday  She came here Friday night  And left because it was so busylmp none

## 2020-08-24 NOTE — ED Provider Notes (Signed)
[facility] [facility] EMERGENCY DEPARTMENT Provider Note   CSN: [id number] Arrival date & time: 08/24/20  0530     History Chief Complaint  Patient presents with  . Back Pain    Shari Berger is a 45 y.o. female.  The history is provided by the patient.  Back Pain Location:  Gluteal region Quality:  Shooting Radiates to:  L thigh Pain severity:  Severe Pain is:  Same all the time Onset quality:  Gradual Timing:  Constant Progression:  Unchanged Chronicity:  Recurrent Context: not MCA, not MVA, not occupational injury and not pedestrian accident   Relieved by:  Nothing Worsened by:  Nothing Ineffective treatments:  None tried Associated symptoms: no abdominal pain, no abdominal swelling, no bladder incontinence, no bowel incontinence, no chest pain, no dysuria, no fever, no headaches, no leg pain, no numbness, no paresthesias, no pelvic pain, no perianal numbness, no tingling, no weakness and no weight loss   Risk factors: no hx of cancer        Past Medical History:  Diagnosis Date  . Acid reflux    ocassional  . Anemia   . Cluster headache   . Fibroid   . Migraines     Patient Active Problem List   Diagnosis Date Noted  . Pneumoperitoneum of unknown etiology 02/06/2020  . S/P TAH (total abdominal hysterectomy) 12/24/2019  . Acute postoperative anemia due to expected blood loss 12/24/2019  . Anemia due to chronic blood loss 12/19/2019  . Fibroids 12/19/2019  . Abnormal uterine bleeding (AUB) 02/01/2019  . Tendonitis of foot 04/12/2017  . Pelvic pain 04/12/2017  . Migraines 04/11/2017  . Acid reflux 04/11/2017    Past Surgical History:  Procedure Laterality Date  . ABDOMINAL HYSTERECTOMY N/A 12/24/2019   Procedure: HYSTERECTOMY ABDOMINAL;  Surgeon: Osborne Oman, MD;  Location: [facility];  Service: Gynecology;  Laterality: N/A;  . LAPAROSCOPY N/A 12/24/2019   Procedure: diagnostic laparoscopy;  Surgeon: Osborne Oman, MD;  Location: [facility];   Service: Gynecology;  Laterality: N/A;  . WISDOM TOOTH EXTRACTION       OB History    Gravida  2   Para  1   Term  1   Preterm  0   AB  1   Living  1     SAB      IAB      Ectopic  1   Multiple      Live Births  1           Family History  Problem Relation Age of Onset  . Depression Mother   . Heart disease Father   . Diabetes Father   . Hypertension Father     Social History   Tobacco Use  . Smoking status: Never Smoker  . Smokeless tobacco: Never Used  Vaping Use  . Vaping Use: Former  Substance Use Topics  . Alcohol use: Yes    Comment: ocassional  . Drug use: No    Home Medications Prior to Admission medications   Medication Sig Start Date End Date Taking? Authorizing Provider  acetaminophen (TYLENOL) 500 MG tablet Take 2 tablets (1,000 mg total) by mouth every 6 (six) hours. 12/26/19   Anyanwu, Sallyanne Havers, MD  fluticasone (FLONASE) 50 MCG/ACT nasal spray Place 2 sprays into both nostrils daily. 06/10/20   Kerin Perna, NP  ibuprofen (ADVIL) 800 MG tablet TAKE 1 TABLET BY MOUTH EVERY 8 HOURS AS NEEDED 07/09/20   Kerin Perna,  NP  loratadine (CLARITIN) 10 MG tablet Take 1 tablet (10 mg total) by mouth daily. 06/10/20   Kerin Perna, NP    Allergies    Patient has no known allergies.  Review of Systems   Review of Systems  Constitutional: Negative for fever and weight loss.  HENT: Negative for drooling.   Eyes: Negative for redness.  Respiratory: Negative for wheezing.   Cardiovascular: Negative for chest pain and leg swelling.  Gastrointestinal: Negative for abdominal pain, bowel incontinence and vomiting.  Genitourinary: Negative for bladder incontinence, difficulty urinating, dysuria and pelvic pain.  Musculoskeletal: Positive for back pain.  Skin: Negative for rash.  Neurological: Negative for tingling, weakness, numbness, headaches and paresthesias.  Psychiatric/Behavioral: Negative for agitation.  All other systems  reviewed and are negative.   Physical Exam Updated Vital Signs BP 138/80   Pulse 98   Temp 98.4 F (36.9 C)   Resp (!) 180   Ht 5\' 5"  (1.651 m)   Wt 105 kg   LMP 12/18/2019   SpO2 100%   BMI 38.52 kg/m   Physical Exam Vitals and nursing note reviewed.  Constitutional:      General: She is not in acute distress.    Appearance: Normal appearance.  HENT:     Head: Normocephalic and atraumatic.     Nose: Nose normal.  Eyes:     Conjunctiva/sclera: Conjunctivae normal.     Pupils: Pupils are equal, round, and reactive to light.  Cardiovascular:     Rate and Rhythm: Normal rate and regular rhythm.     Pulses: Normal pulses.     Heart sounds: Normal heart sounds.  Pulmonary:     Effort: Pulmonary effort is normal.     Breath sounds: Normal breath sounds.  Abdominal:     General: Abdomen is flat. Bowel sounds are normal.     Palpations: Abdomen is soft.     Tenderness: There is no abdominal tenderness. There is no guarding.  Musculoskeletal:        General: Normal range of motion.     Cervical back: Normal, normal range of motion and neck supple.     Thoracic back: Normal.     Lumbar back: Normal.  Skin:    General: Skin is warm and dry.     Capillary Refill: Capillary refill takes less than 2 seconds.  Neurological:     General: No focal deficit present.     Mental Status: She is alert and oriented to person, place, and time.     Deep Tendon Reflexes: Reflexes normal.  Psychiatric:        Mood and Affect: Mood normal.        Behavior: Behavior normal.     ED Results / Procedures / Treatments   Labs (all labs ordered are listed, but only abnormal results are displayed) Labs Reviewed - No data to display  EKG None  Radiology No results found.  Procedures Procedures   Medications Ordered in ED Medications  lidocaine (LIDODERM) 5 % 2 patch (has no administration in time range)  predniSONE (DELTASONE) tablet 60 mg (has no administration in time range)   meloxicam (MOBIC) tablet 7.5 mg (has no administration in time range)    ED Course  I have reviewed the triage vital signs and the nursing notes.  Pertinent labs & imaging results that were available during my care of the patient were reviewed by me and considered in my medical decision making (see chart for details).  Patient with chronic back pain with recent MRI ([facility]) presents with Sciatica.  Will start NSAIDs, steroids and lidoderm.  Follow up with your spinal specialist.    Keon Benscoter was evaluated in Emergency Department on 08/24/2020 for the symptoms described in the history of present illness. She was evaluated in the context of the global COVID-19 pandemic, which necessitated consideration that the patient might be at risk for infection with the SARS-CoV-2 virus that causes COVID-19. Institutional protocols and algorithms that pertain to the evaluation of patients at risk for COVID-19 are in a state of rapid change based on information released by regulatory bodies including the CDC and federal and state organizations. These policies and algorithms were followed during the patient's care in the ED.  Final Clinical Impression(s) / ED Diagnoses Return for intractable cough, coughing up blood, fevers >100.4 unrelieved by medication, shortness of breath, intractable vomiting, chest pain, shortness of breath, weakness, numbness, changes in speech, facial asymmetry, abdominal pain, passing out, Inability to tolerate liquids or food, cough, altered mental status or any concerns. No signs of systemic illness or infection. The patient is nontoxic-appearing on exam and vital signs are within normal limits.  I have reviewed the triage vital signs and the nursing notes. Pertinent labs & imaging results that were available during my care of the patient were reviewed by me and considered in my medical decision making (see chart for details). After history, exam, and medical workup I feel the patient  has been appropriately medically screened and is safe for discharge home. Pertinent diagnoses were discussed with the patient. Patient was given return precautions.    Kania Regnier, MD 08/24/20 [phone]

## 2020-09-10 ENCOUNTER — Encounter (INDEPENDENT_AMBULATORY_CARE_PROVIDER_SITE_OTHER): Payer: Self-pay | Admitting: Primary Care

## 2020-09-10 ENCOUNTER — Other Ambulatory Visit: Payer: Self-pay

## 2020-09-10 ENCOUNTER — Ambulatory Visit (INDEPENDENT_AMBULATORY_CARE_PROVIDER_SITE_OTHER): Payer: Medicaid Other | Admitting: Primary Care

## 2020-09-10 VITALS — BP 117/84 | HR 78 | Temp 97.5°F | Resp 16 | Wt 222.0 lb

## 2020-09-10 DIAGNOSIS — E559 Vitamin D deficiency, unspecified: Secondary | ICD-10-CM

## 2020-09-10 DIAGNOSIS — J302 Other seasonal allergic rhinitis: Secondary | ICD-10-CM

## 2020-09-10 DIAGNOSIS — D500 Iron deficiency anemia secondary to blood loss (chronic): Secondary | ICD-10-CM | POA: Diagnosis not present

## 2020-09-10 MED ORDER — FLUTICASONE PROPIONATE 50 MCG/ACT NA SUSP: 2.0000 | Freq: Every day | NASAL | 6 refills | Status: DC

## 2020-09-10 MED ORDER — LORATADINE 10 MG PO TABS: 10.0000 mg | ORAL_TABLET | Freq: Every day | ORAL | 11 refills | Status: DC

## 2020-09-10 NOTE — Progress Notes (Signed)
Follow up for anemia

## 2020-09-10 NOTE — Patient Instructions (Signed)
Iron rich foods such as shellfish,liver, organ meats(liver, gizzard), and red meats can increase cholesterol and should be consumed in moderation.However; legumes(beans), spinach, pumpkin seeds, turkey, broccoli, tofu, green leafy vegetables and dark chocolate can be consumed without concern to cholesterol.  °

## 2020-09-11 ENCOUNTER — Other Ambulatory Visit (INDEPENDENT_AMBULATORY_CARE_PROVIDER_SITE_OTHER): Payer: Self-pay | Admitting: Primary Care

## 2020-09-11 DIAGNOSIS — E559 Vitamin D deficiency, unspecified: Secondary | ICD-10-CM

## 2020-09-11 LAB — VITAMIN D 25 HYDROXY (VIT D DEFICIENCY, FRACTURES): Vit D, 25-Hydroxy: 21.6 ng/mL — ABNORMAL LOW (ref 30.0–100.0)

## 2020-09-11 MED ORDER — VITAMIN D3 50 MCG (2000 UT) PO CAPS: 2000.0000 [IU] | ORAL_CAPSULE | Freq: Every day | ORAL | 1 refills | Status: DC

## 2020-09-14 NOTE — Progress Notes (Signed)
Established Patient Office Visit  Subjective:  Patient ID: Shari Berger, female    DOB: [birth year]  Age: 45 y.o. MRN: [id number]  CC: anemia   HPI Shari Berger presents for management of anemia.  Past Medical History:  Diagnosis Date   Acid reflux    ocassional   Anemia    Cluster headache    Fibroid    Migraines     Past Surgical History:  Procedure Laterality Date   ABDOMINAL HYSTERECTOMY N/A 12/24/2019   Procedure: HYSTERECTOMY ABDOMINAL;  Surgeon: Osborne Oman, MD;  Location: [facility];  Service: Gynecology;  Laterality: N/A;   LAPAROSCOPY N/A 12/24/2019   Procedure: diagnostic laparoscopy;  Surgeon: Osborne Oman, MD;  Location: [facility];  Service: Gynecology;  Laterality: N/A;   WISDOM TOOTH EXTRACTION      Family History  Problem Relation Age of Onset   Depression Mother    Heart disease Father    Diabetes Father    Hypertension Father     Social History   Socioeconomic History   Marital status: Single    Spouse name: Not on file   Number of children: Not on file   Years of education: Not on file   Highest education level: 12th grade  Occupational History   Not on file  Tobacco Use   Smoking status: Never   Smokeless tobacco: Never  Vaping Use   Vaping Use: Former  Substance and Sexual Activity   Alcohol use: Yes    Comment: ocassional   Drug use: No   Sexual activity: Yes    Partners: Male    Birth control/protection: None  Other Topics Concern   Not on file  Social History Narrative   Not on file   Social Determinants of Health   Financial Resource Strain: Not on file  Food Insecurity: No Food Insecurity   Worried About Running Out of Food in the Last Year: Never true   [place] in the Last Year: Never true  Transportation Needs: No Transportation Needs   Lack of Transportation (Medical): No   Lack of Transportation (Non-Medical): No  Physical Activity: Not on file  Stress: Not on file  Social Connections: Not on file   Intimate Partner Violence: Not on file    Outpatient Medications Prior to Visit  Medication Sig Dispense Refill   acetaminophen (TYLENOL) 500 MG tablet Take 2 tablets (1,000 mg total) by mouth every 6 (six) hours. (Patient taking differently: Take 1,000 mg by mouth every 8 (eight) hours as needed for mild pain or headache.) 30 tablet 2   Diclofenac Sodium CR 100 MG 24 hr tablet Take 1 tablet (100 mg total) by mouth daily. 10 tablet 0   ibuprofen (ADVIL) 800 MG tablet TAKE 1 TABLET BY MOUTH EVERY 8 HOURS AS NEEDED (Patient taking differently: Take 800 mg by mouth every 8 (eight) hours as needed for headache or moderate pain.) 90 tablet 1   lidocaine (LIDODERM) 5 % Place 1 patch onto the skin daily. Remove & Discard patch within 12 hours or as directed by MD 30 patch 0   predniSONE (DELTASONE) 20 MG tablet 3 tabs po day one, then 2 po daily x 4 days 11 tablet 0   fluticasone (FLONASE) 50 MCG/ACT nasal spray Place 2 sprays into both nostrils daily. (Patient not taking: Reported on 08/24/2020) 16 g 6   loratadine (CLARITIN) 10 MG tablet Take 1 tablet (10 mg total) by mouth daily. (Patient not taking: Reported on  08/24/2020) 30 tablet 11   No facility-administered medications prior to visit.    No Known Allergies  ROS Review of Systems  Constitutional:  Positive for fatigue.  HENT:  Positive for postnasal drip and sneezing.   Respiratory:  Positive for cough.   Endocrine: Positive for cold intolerance.  All other systems reviewed and are negative.    Objective:    Physical Exam Constitutional:      Appearance: She is obese.  HENT:     Head: Normocephalic.     Right Ear: Tympanic membrane and external ear normal.     Left Ear: Tympanic membrane and external ear normal.     Nose: Nose normal.  Eyes:     Extraocular Movements: Extraocular movements intact.     Pupils: Pupils are equal, round, and reactive to light.  Cardiovascular:     Rate and Rhythm: Normal rate and regular rhythm.   Pulmonary:     Effort: Pulmonary effort is normal.     Breath sounds: Normal breath sounds.  Abdominal:     General: Bowel sounds are normal. There is distension.     Palpations: Abdomen is soft.  Musculoskeletal:        General: Normal range of motion.  Skin:    General: Skin is dry.  Neurological:     Mental Status: She is alert and oriented to person, place, and time.  Psychiatric:        Mood and Affect: Mood normal.        Behavior: Behavior normal.        Thought Content: Thought content normal.        Judgment: Judgment normal.   BP 117/84   Pulse 78   Temp (!) 97.5 F (36.4 C)   Resp 16   Wt 222 lb (100.7 kg)   LMP 12/18/2019   SpO2 98%   BMI 36.94 kg/m  Wt Readings from Last 3 Encounters:  09/10/20 222 lb (100.7 kg)  08/24/20 231 lb 7.7 oz (105 kg)  08/22/20 231 lb 7.7 oz (105 kg)     Health Maintenance Due  Topic Date Due   COVID-19 Vaccine (3 - Booster for Pfizer series) 12/11/2019    There are no preventive care reminders to display for this patient.  No results found for: TSH Lab Results  Component Value Date   WBC 8.8 08/22/2020   HGB 13.0 08/22/2020   HCT 39.3 08/22/2020   MCV 83.3 08/22/2020   PLT 290 08/22/2020   Lab Results  Component Value Date   NA 137 08/22/2020   K 3.6 08/22/2020   CO2 23 08/22/2020   GLUCOSE 103 (H) 08/22/2020   BUN 11 08/22/2020   CREATININE 0.73 08/22/2020   BILITOT 0.7 02/06/2020   ALKPHOS 53 02/06/2020   AST 11 (L) 02/06/2020   ALT 17 02/06/2020   PROT 6.5 02/06/2020   ALBUMIN 2.8 (L) 02/06/2020   CALCIUM 8.7 (L) 08/22/2020   ANIONGAP 6 08/22/2020   Lab Results  Component Value Date   CHOL 196 06/10/2020   Lab Results  Component Value Date   HDL 44 06/10/2020   Lab Results  Component Value Date   LDLCALC 137 (H) 06/10/2020   Lab Results  Component Value Date   TRIG 81 06/10/2020   Lab Results  Component Value Date   CHOLHDL 4.5 (H) 06/10/2020   Lab Results  Component Value Date    HGBA1C 5.2 06/10/2020      Assessment & Plan:  Diagnoses and all orders for this visit:  Vitamin D deficiency Fatigue  -     Vitamin D, 25-hydroxy  Seasonal allergies  Use Flonase nasal spray for at least duration of your allergy season.  - For appropriate administration of the nasal spray, clear the nose, use opposite hand for opposite nare, sniff gently, exhale through your mouth. - For maximal effect take these two nasal sprays at least 30 minutes apart. - Continue Allegra, Claritin or Zyrtec each day, as needed. - Drink at least 64 ounces of water each day. - If you have a humidifier use it nightly. - Remove as many irritants/allergies as you are able to, no pets in the bedroom, change air filters in air vents.  -     fluticasone (FLONASE) 50 MCG/ACT nasal spray; Place 2 sprays into both nostrils daily. -     loratadine (CLARITIN) 10 MG tablet; Take 1 tablet (10 mg total) by mouth daily.  Iron deficiency anemia due to chronic blood loss  Anemia is a condition in which you lack enough healthy red blood cells to carry adequate oxygen to your body's tissues. Having anemia can make you feel tired and weak. Anemia can be temporary or long term, and it can range from mild to severe    Meds ordered this encounter  Medications   fluticasone (FLONASE) 50 MCG/ACT nasal spray    Sig: Place 2 sprays into both nostrils daily.    Dispense:  16 g    Refill:  6   loratadine (CLARITIN) 10 MG tablet    Sig: Take 1 tablet (10 mg total) by mouth daily.    Dispense:  30 tablet    Refill:  11    Follow-up: Return in about 3 months (around 12/11/2020) for CBC/anemia.    Kerin Perna, NP

## 2020-12-11 ENCOUNTER — Ambulatory Visit (INDEPENDENT_AMBULATORY_CARE_PROVIDER_SITE_OTHER): Payer: Medicaid Other | Admitting: Primary Care

## 2020-12-11 ENCOUNTER — Encounter (INDEPENDENT_AMBULATORY_CARE_PROVIDER_SITE_OTHER): Payer: Self-pay

## 2021-01-26 ENCOUNTER — Ambulatory Visit (INDEPENDENT_AMBULATORY_CARE_PROVIDER_SITE_OTHER): Payer: Medicaid Other | Admitting: Primary Care

## 2021-01-26 ENCOUNTER — Other Ambulatory Visit: Payer: Self-pay

## 2021-01-26 ENCOUNTER — Encounter (INDEPENDENT_AMBULATORY_CARE_PROVIDER_SITE_OTHER): Payer: Self-pay | Admitting: Primary Care

## 2021-01-26 VITALS — BP 144/84 | HR 84 | Temp 98.1°F | Ht 65.0 in | Wt 241.8 lb

## 2021-01-26 DIAGNOSIS — Z6841 Body Mass Index (BMI) 40.0 and over, adult: Secondary | ICD-10-CM

## 2021-01-26 DIAGNOSIS — R519 Headache, unspecified: Secondary | ICD-10-CM | POA: Diagnosis not present

## 2021-01-26 DIAGNOSIS — K2100 Gastro-esophageal reflux disease with esophagitis, without bleeding: Secondary | ICD-10-CM

## 2021-01-26 MED ORDER — METOPROLOL TARTRATE 25 MG PO TABS: 12.5000 mg | ORAL_TABLET | Freq: Two times a day (BID) | ORAL | 0 refills | Status: DC

## 2021-01-26 MED ORDER — OMEPRAZOLE 20 MG PO CPDR: 20.0000 mg | DELAYED_RELEASE_CAPSULE | Freq: Every day | ORAL | 1 refills | Status: DC

## 2021-01-26 NOTE — Patient Instructions (Signed)

## 2021-01-26 NOTE — Progress Notes (Signed)
[facility]   Subjective:     Shari Berger is an 45 y.o. morbid obese female who presents for evaluation of heartburn. This has been associated with chest pain , burning at night and heartburn. She denies abdominal bloating, choking on food, deep pressure at base of neck, hematemesis, laryngitis, midespigastric pain, and need to clear throat frequently. Symptoms have been present for  12  months. She denies dysphagia. She has had a weight loss of 5 pounds over a period of 2 months. ( Intentional  weight loss)She denies melena, hematochezia, hematemesis, and coffee ground emesis. Medical therapy in the past has included: antacids.  Patient also has been having more recent headaches previously cluster headaches diagnosed by her previous provider.  Now she is having headaches from temple to his temporal 10/10 sometimes she wakes up with headaches and will go ahead and take 800 mg of ibuprofen also headache eating worse.  Prior to her appointment she was disposing of a red bull.  She states she does not drink them often but this can be the underlying cause of her headaches we will also discuss other triggers The following portions of the patient's history were reviewed and updated as appropriate: allergies, current medications, past family history, past medical history, past social history, and past surgical history.  Review of Systems Pertinent items noted in HPI and remainder of comprehensive ROS otherwise negative.   Objective:  BP (!) 144/84 (BP Location: Right Arm, Patient Position: Sitting, Cuff Size: Large)   Pulse 84   Temp 98.1 F (36.7 C) (Temporal)   Ht 5\' 5"  (1.651 m)   Wt 241 lb 12.8 oz (109.7 kg)   LMP 12/18/2019   SpO2 100%   BMI 40.24 kg/m     BP (!) 144/84 (BP Location: Right Arm, Patient Position: Sitting, Cuff Size: Large)   Pulse 84   Temp 98.1 F (36.7 C) (Temporal)   Ht 5\' 5"  (1.651 m)   Wt 241 lb 12.8 oz (109.7 kg)   LMP 12/18/2019   SpO2 100%    BMI 40.24 kg/m  Physical Exam  General appearance: alert, cooperative, appears stated age, and morbidly obese Head: Normocephalic, without obvious abnormality, atraumatic Eyes: conjunctivae/corneas clear. PERRL, EOM's intact. Fundi benign. Ears: normal TM's and external ear canals both ears Nose: Nares normal. Septum midline. Mucosa normal. No drainage or sinus tenderness. Neck: no adenopathy, no carotid bruit, no JVD, supple, symmetrical, trachea midline, and thyroid not enlarged, symmetric, no tenderness/mass/nodules Lungs: clear to auscultation bilaterally Heart: regular rate and rhythm, S1, S2 normal, no murmur, click, rub or gallop Abdomen: soft, non-tender; bowel sounds normal; no masses,  no organomegaly Extremities: extremities normal, atraumatic, no cyanosis or edema Skin: Skin color, texture, turgor normal. No rashes or lesions Lymph nodes: Cervical, supraclavicular, and axillary nodes normal.   Assessment:    Gastroesophageal Reflux Disease Discussed eating small frequent meal, reduction in acidic foods, fried foods ,spicy foods, alcohol caffeine and tobacco and certain medications. Avoid laying down after eating 65mins-1hour, elevated head of the bed.   No red flags present. Diet discussed. Avoid fried, spicy, fatty, greasy, and acidic foods. Avoid caffeine, nicotine, and alcohol. Do not eat 2-3 hours before bedtime and stay upright for at least 1-2 hours after eating. Eat small frequent meals. Avoid NSAID's like motrin and aleve. Medications as prescribed. Report any new or worsening symptoms. Follow up as discussed or sooner if needed.    Feather was seen today for headache and gastroesophageal reflux.  Diagnoses  and all orders for this visit:  Bilateral headaches  Gastroesophageal reflux disease with esophagitis without hemorrhage   This note has been created with Surveyor, quantity. Any transcriptional errors are unintentional.

## 2021-02-10 ENCOUNTER — Encounter (HOSPITAL_COMMUNITY): Payer: Self-pay | Admitting: *Deleted

## 2021-02-10 ENCOUNTER — Other Ambulatory Visit: Payer: Self-pay

## 2021-02-10 ENCOUNTER — Emergency Department (HOSPITAL_COMMUNITY)
Admission: EM | Admit: 2021-02-10 | Discharge: 2021-02-10 | Disposition: A | Discharge status: Home / Self Care | Payer: Medicaid Other | Attending: Student | Admitting: Student

## 2021-02-10 ENCOUNTER — Emergency Department (HOSPITAL_COMMUNITY): Payer: Medicaid Other

## 2021-02-10 DIAGNOSIS — R109 Unspecified abdominal pain: Secondary | ICD-10-CM | POA: Diagnosis not present

## 2021-02-10 DIAGNOSIS — M549 Dorsalgia, unspecified: Secondary | ICD-10-CM | POA: Diagnosis not present

## 2021-02-10 DIAGNOSIS — T148XXA Other injury of unspecified body region, initial encounter: Secondary | ICD-10-CM

## 2021-02-10 LAB — CBC WITH DIFFERENTIAL/PLATELET
Abs Immature Granulocytes: 0.02 10*3/uL (ref 0.00–0.07)
Basophils Absolute: 0 10*3/uL (ref 0.0–0.1)
Basophils Relative: 1 %
Eosinophils Absolute: 0.3 10*3/uL (ref 0.0–0.5)
Eosinophils Relative: 4 %
HCT: 38.5 % (ref 36.0–46.0)
Hemoglobin: 12.9 g/dL (ref 12.0–15.0)
Immature Granulocytes: 0 %
Lymphocytes Relative: 36 %
Lymphs Abs: 2.7 10*3/uL (ref 0.7–4.0)
MCH: 28.9 pg (ref 26.0–34.0)
MCHC: 33.5 g/dL (ref 30.0–36.0)
MCV: 86.1 fL (ref 80.0–100.0)
Monocytes Absolute: 0.5 10*3/uL (ref 0.1–1.0)
Monocytes Relative: 6 %
Neutro Abs: 3.9 10*3/uL (ref 1.7–7.7)
Neutrophils Relative %: 53 %
Platelets: 284 10*3/uL (ref 150–400)
RBC: 4.47 MIL/uL (ref 3.87–5.11)
RDW: 13.7 % (ref 11.5–15.5)
WBC: 7.4 10*3/uL (ref 4.0–10.5)
nRBC: 0 % (ref 0.0–0.2)

## 2021-02-10 LAB — COMPREHENSIVE METABOLIC PANEL
ALT: 17 U/L (ref 0–44)
AST: 8 U/L — ABNORMAL LOW (ref 15–41)
Albumin: 2.9 g/dL — ABNORMAL LOW (ref 3.5–5.0)
Alkaline Phosphatase: 61 U/L (ref 38–126)
Anion gap: 7 (ref 5–15)
BUN: 10 mg/dL (ref 6–20)
CO2: 23 mmol/L (ref 22–32)
Calcium: 8.5 mg/dL — ABNORMAL LOW (ref 8.9–10.3)
Chloride: 106 mmol/L (ref 98–111)
Creatinine, Ser: 0.67 mg/dL (ref 0.44–1.00)
GFR, Estimated: >60 mL/min (ref >60)
Glucose, Bld: 88 mg/dL (ref 70–99)
Potassium: 3.8 mmol/L (ref 3.5–5.1)
Sodium: 136 mmol/L (ref 135–145)
Total Bilirubin: 0.6 mg/dL (ref 0.3–1.2)
Total Protein: 6.6 g/dL (ref 6.5–8.1)

## 2021-02-10 LAB — URINALYSIS, ROUTINE W REFLEX MICROSCOPIC
Bilirubin Urine: NEGATIVE
Glucose, UA: NEGATIVE mg/dL
Hgb urine dipstick: NEGATIVE
Ketones, ur: NEGATIVE mg/dL
Leukocytes,Ua: NEGATIVE
Nitrite: NEGATIVE
Protein, ur: NEGATIVE mg/dL
Specific Gravity, Urine: 1.02 (ref 1.005–1.030)
pH: 7 (ref 5.0–8.0)

## 2021-02-10 LAB — LIPASE, BLOOD: Lipase: 29 U/L (ref 11–51)

## 2021-02-10 MED ORDER — LIDOCAINE 4 % EX PTCH: 1.0000 | MEDICATED_PATCH | Freq: Two times a day (BID) | CUTANEOUS | 0 refills | Status: DC

## 2021-02-10 MED ORDER — LIDOCAINE 5 % EX PTCH
1.0000 | MEDICATED_PATCH | CUTANEOUS | Status: DC
  Administered 2021-02-10: 1 via TRANSDERMAL
  Filled 2021-02-10: qty 1

## 2021-02-10 MED ORDER — IBUPROFEN 400 MG PO TABS
600.0000 mg | ORAL_TABLET | Freq: Once | ORAL | Status: AC
  Administered 2021-02-10: 600 mg via ORAL
  Filled 2021-02-10: qty 1

## 2021-02-10 NOTE — ED Triage Notes (Signed)
Pt reports waking up with left side pain, no injury. Pain radiates some to back and into abd area. Denies urinary symptoms.

## 2021-02-10 NOTE — ED Provider Notes (Signed)
Emergency Medicine Provider Triage Evaluation Note  Shari Berger , a 45 y.o. female  was evaluated in triage.  Pt complains of left-sided flank pain x1 day.  Patient states pain radiates to left side of back and abdomen when lying on side. Denies hematuria.  No urinary symptoms.  No vaginal symptoms.  Denies nausea, vomiting, and diarrhea.  No fever or chills.  No history of kidney stones.  No overlying rash.  Denies injury to left flank region.  Review of Systems  Positive: Flank pain Negative: fever  Physical Exam  BP (!) 146/89 (BP Location: Right Arm)   Pulse 85   Temp 97.8 F (36.6 C)   Resp 17   LMP 12/18/2019   SpO2 100%  Gen:   Awake, no distress   Resp:  Normal effort  MSK:   Moves extremities without difficulty  Other:  TTP in left flank region  Medical Decision Making  Medically screening exam initiated at 9:48 AM.  Appropriate orders placed.  Shari Berger was informed that the remainder of the evaluation will be completed by another provider, this initial triage assessment does not replace that evaluation, and the importance of remaining in the ED until their evaluation is complete.  Labs CT renal study If renal study negative, possible MSK etiology. No overlying rash to suggest shingles   Karie Kirks 02/10/21 0017    Lorelle Gibbs, DO 02/10/21 1405

## 2021-02-10 NOTE — ED Provider Notes (Signed)
[facility] EMERGENCY DEPARTMENT Provider Note   CSN: [id number] Arrival date & time: 02/10/21  2637     History Chief Complaint  Patient presents with   Back Pain    Shari Berger is a 45 y.o. female who presents emergency department for evaluation of left flank pain.  Patient states that she awoke yesterday morning with acute onset left flank pain.  She states that the pain does not radiate and is a 5 out of 10.  No alleviating or exacerbating factors.  Denies dysuria, chest pain, shortness of breath, abdominal pain with nausea, vomiting, hematuria or any other systemic symptoms.   Back Pain Associated symptoms: no abdominal pain, no chest pain, no dysuria and no fever       Past Medical History:  Diagnosis Date   Acid reflux    ocassional   Anemia    Cluster headache    Fibroid    Migraines     Patient Active Problem List   Diagnosis Date Noted   Pneumoperitoneum of unknown etiology 02/06/2020   S/P TAH (total abdominal hysterectomy) 12/24/2019   Acute postoperative anemia due to expected blood loss 12/24/2019   Anemia due to chronic blood loss 12/19/2019   Fibroids 12/19/2019   Abnormal uterine bleeding (AUB) 02/01/2019   Tendonitis of foot 04/12/2017   Pelvic pain 04/12/2017   Migraines 04/11/2017   Acid reflux 04/11/2017    Past Surgical History:  Procedure Laterality Date   ABDOMINAL HYSTERECTOMY N/A 12/24/2019   Procedure: HYSTERECTOMY ABDOMINAL;  Surgeon: Osborne Oman, MD;  Location: [facility];  Service: Gynecology;  Laterality: N/A;   LAPAROSCOPY N/A 12/24/2019   Procedure: diagnostic laparoscopy;  Surgeon: Osborne Oman, MD;  Location: [facility];  Service: Gynecology;  Laterality: N/A;   WISDOM TOOTH EXTRACTION       OB History     Gravida  2   Para  1   Term  1   Preterm  0   AB  1   Living  1      SAB      IAB      Ectopic  1   Multiple      Live Births  1           Family History  Problem Relation  Age of Onset   Depression Mother    Heart disease Father    Diabetes Father    Hypertension Father     Social History   Tobacco Use   Smoking status: Never   Smokeless tobacco: Never  Vaping Use   Vaping Use: Former  Substance Use Topics   Alcohol use: Yes    Comment: ocassional   Drug use: No    Home Medications Prior to Admission medications   Medication Sig Start Date End Date Taking? Authorizing Provider  acetaminophen (TYLENOL) 500 MG tablet Take 2 tablets (1,000 mg total) by mouth every 6 (six) hours. Patient taking differently: Take 1,000 mg by mouth every 8 (eight) hours as needed for mild pain or headache. 12/26/19   Anyanwu, Sallyanne Havers, MD  Cholecalciferol (VITAMIN D3) 50 MCG (2000 UT) capsule Take 1 capsule (2,000 Units total) by mouth daily. 09/11/20   Kerin Perna, NP  fluticasone (FLONASE) 50 MCG/ACT nasal spray Place 2 sprays into both nostrils daily. 09/10/20   Kerin Perna, NP  ibuprofen (ADVIL) 800 MG tablet TAKE 1 TABLET BY MOUTH EVERY 8 HOURS AS NEEDED Patient taking differently: Take 800 mg by  mouth every 8 (eight) hours as needed for headache or moderate pain. 07/09/20   Kerin Perna, NP  metoprolol tartrate (LOPRESSOR) 25 MG tablet Take 0.5 tablets (12.5 mg total) by mouth 2 (two) times daily for 1 dose. 01/26/21 01/27/21  Kerin Perna, NP  omeprazole (PRILOSEC) 20 MG capsule Take 1 capsule (20 mg total) by mouth daily. 01/26/21   Kerin Perna, NP    Allergies    Patient has no known allergies.  Review of Systems   Review of Systems  Constitutional:  Negative for chills and fever.  HENT:  Negative for ear pain and sore throat.   Eyes:  Negative for pain and visual disturbance.  Respiratory:  Negative for cough and shortness of breath.   Cardiovascular:  Negative for chest pain and palpitations.  Gastrointestinal:  Negative for abdominal pain and vomiting.  Genitourinary:  Positive for flank pain. Negative for dysuria and  hematuria.  Musculoskeletal:  Positive for back pain. Negative for arthralgias.  Skin:  Negative for color change and rash.  Neurological:  Negative for seizures and syncope.  All other systems reviewed and are negative.  Physical Exam Updated Vital Signs BP (!) 146/89 (BP Location: Right Arm)   Pulse 85   Temp 97.8 F (36.6 C)   Resp 17   LMP 12/18/2019   SpO2 100%   Physical Exam Vitals and nursing note reviewed.  Constitutional:      General: She is not in acute distress.    Appearance: She is well-developed.  HENT:     Head: Normocephalic and atraumatic.  Eyes:     Conjunctiva/sclera: Conjunctivae normal.  Cardiovascular:     Rate and Rhythm: Normal rate and regular rhythm.     Heart sounds: No murmur heard. Pulmonary:     Effort: Pulmonary effort is normal. No respiratory distress.     Breath sounds: Normal breath sounds.  Abdominal:     Palpations: Abdomen is soft.     Tenderness: There is no abdominal tenderness.  Musculoskeletal:        General: Tenderness (Point tenderness over lateral flank on left) present.     Cervical back: Neck supple.  Skin:    General: Skin is warm and dry.  Neurological:     Mental Status: She is alert.    ED Results / Procedures / Treatments   Labs (all labs ordered are listed, but only abnormal results are displayed) Labs Reviewed  COMPREHENSIVE METABOLIC PANEL - Abnormal; Notable for the following components:      Result Value   Calcium 8.5 (*)    Albumin 2.9 (*)    AST 8 (*)    All other components within normal limits  URINALYSIS, ROUTINE W REFLEX MICROSCOPIC - Abnormal; Notable for the following components:   APPearance HAZY (*)    All other components within normal limits  CBC WITH DIFFERENTIAL/PLATELET  LIPASE, BLOOD    EKG None  Radiology CT Renal Stone Study  Result Date: 02/10/2021 CLINICAL DATA:  Left flank pain.  Kidney stone suspected. EXAM: CT ABDOMEN AND PELVIS WITHOUT CONTRAST TECHNIQUE:  Multidetector CT imaging of the abdomen and pelvis was performed following the standard protocol without IV contrast. COMPARISON:  02/06/2020 FINDINGS: Lower chest: Hiatal hernia.  Lung bases are clear. Hepatobiliary: Liver and gallbladder appear normal. Pancreas: Normal Spleen: Normal Adrenals/Urinary Tract: Adrenal glands are normal. Kidneys are normal. No cyst, mass, stone or hydronephrosis. No stone within either ureter. No stone in the bladder. Phleboliths in the  pelvis. Stomach/Bowel: Hiatal hernia as noted above. Stomach and small intestine are otherwise normal. Normal appendix. No diverticulosis or diverticulitis. Vascular/Lymphatic: Normal Reproductive: Normal Other: No free fluid or air. Previously seen pneumoperitoneum has resolved. Musculoskeletal: Normal IMPRESSION: Normal CT scan of the abdomen and pelvis other than the presence of a hiatal hernia. No evidence of urinary tract stone disease. Previously seen pneumoperitoneum has resolved. Electronically Signed   By: Nelson Chimes M.D.   On: 02/10/2021 10:59    Procedures Procedures   Medications Ordered in ED Medications  ibuprofen (ADVIL) tablet 600 mg (has no administration in time range)  lidocaine (LIDODERM) 5 % 1 patch (has no administration in time range)    ED Course  I have reviewed the triage vital signs and the nursing notes.  Pertinent labs & imaging results that were available during my care of the patient were reviewed by me and considered in my medical decision making (see chart for details).    MDM Rules/Calculators/A&P                           Patient seen emergency department for evaluation of flank pain.  Physical exam reveals a point area of tenderness in the left flank that is otherwise unremarkable.  Laboratory evaluation unremarkable.  CT stone study negative.  Patient given a lidocaine patch over area in question which led to significant provement of the patient's symptoms.  Concern for muscle strain at this  time.  Patient then discharged with a prescription for Salonpas. Final Clinical Impression(s) / ED Diagnoses Final diagnoses:  None    Rx / DC Orders ED Discharge Orders     None        Shari Pehrson, MD 02/10/21 1344

## 2021-05-26 ENCOUNTER — Other Ambulatory Visit (INDEPENDENT_AMBULATORY_CARE_PROVIDER_SITE_OTHER): Payer: Self-pay | Admitting: Primary Care

## 2021-05-26 DIAGNOSIS — R519 Headache, unspecified: Secondary | ICD-10-CM

## 2021-07-13 ENCOUNTER — Emergency Department (HOSPITAL_COMMUNITY)
Admission: EM | Admit: 2021-07-13 | Discharge: 2021-07-13 | Disposition: A | Discharge status: Home / Self Care | Payer: Medicaid Other | Attending: Emergency Medicine | Admitting: Emergency Medicine

## 2021-07-13 ENCOUNTER — Other Ambulatory Visit: Payer: Self-pay

## 2021-07-13 ENCOUNTER — Other Ambulatory Visit (HOSPITAL_COMMUNITY): Payer: Self-pay

## 2021-07-13 ENCOUNTER — Encounter (HOSPITAL_COMMUNITY): Payer: Self-pay

## 2021-07-13 ENCOUNTER — Encounter: Payer: Self-pay | Admitting: Nurse Practitioner

## 2021-07-13 DIAGNOSIS — R770 Abnormality of albumin: Secondary | ICD-10-CM | POA: Diagnosis not present

## 2021-07-13 DIAGNOSIS — R1012 Left upper quadrant pain: Secondary | ICD-10-CM | POA: Diagnosis present

## 2021-07-13 DIAGNOSIS — K279 Peptic ulcer, site unspecified, unspecified as acute or chronic, without hemorrhage or perforation: Secondary | ICD-10-CM | POA: Diagnosis not present

## 2021-07-13 DIAGNOSIS — R7401 Elevation of levels of liver transaminase levels: Secondary | ICD-10-CM | POA: Diagnosis not present

## 2021-07-13 LAB — URINALYSIS, ROUTINE W REFLEX MICROSCOPIC
Bilirubin Urine: NEGATIVE
Glucose, UA: NEGATIVE mg/dL
Hgb urine dipstick: NEGATIVE
Ketones, ur: NEGATIVE mg/dL
Leukocytes,Ua: NEGATIVE
Nitrite: NEGATIVE
Protein, ur: NEGATIVE mg/dL
Specific Gravity, Urine: 1.018 (ref 1.005–1.030)
pH: 5 (ref 5.0–8.0)

## 2021-07-13 LAB — COMPREHENSIVE METABOLIC PANEL
ALT: 17 U/L (ref 0–44)
AST: 9 U/L — ABNORMAL LOW (ref 15–41)
Albumin: 3.1 g/dL — ABNORMAL LOW (ref 3.5–5.0)
Alkaline Phosphatase: 50 U/L (ref 38–126)
Anion gap: 7 (ref 5–15)
BUN: 11 mg/dL (ref 6–20)
CO2: 20 mmol/L — ABNORMAL LOW (ref 22–32)
Calcium: 8.7 mg/dL — ABNORMAL LOW (ref 8.9–10.3)
Chloride: 108 mmol/L (ref 98–111)
Creatinine, Ser: 0.68 mg/dL (ref 0.44–1.00)
GFR, Estimated: >60 mL/min (ref >60)
Glucose, Bld: 103 mg/dL — ABNORMAL HIGH (ref 70–99)
Potassium: 4 mmol/L (ref 3.5–5.1)
Sodium: 135 mmol/L (ref 135–145)
Total Bilirubin: 0.7 mg/dL (ref 0.3–1.2)
Total Protein: 6.9 g/dL (ref 6.5–8.1)

## 2021-07-13 LAB — CBC WITH DIFFERENTIAL/PLATELET
Abs Immature Granulocytes: 0.02 10*3/uL (ref 0.00–0.07)
Basophils Absolute: 0 10*3/uL (ref 0.0–0.1)
Basophils Relative: 0 %
Eosinophils Absolute: 0.4 10*3/uL (ref 0.0–0.5)
Eosinophils Relative: 4 %
HCT: 40.9 % (ref 36.0–46.0)
Hemoglobin: 14.1 g/dL (ref 12.0–15.0)
Immature Granulocytes: 0 %
Lymphocytes Relative: 31 %
Lymphs Abs: 2.9 10*3/uL (ref 0.7–4.0)
MCH: 29.6 pg (ref 26.0–34.0)
MCHC: 34.5 g/dL (ref 30.0–36.0)
MCV: 85.7 fL (ref 80.0–100.0)
Monocytes Absolute: 0.6 10*3/uL (ref 0.1–1.0)
Monocytes Relative: 6 %
Neutro Abs: 5.3 10*3/uL (ref 1.7–7.7)
Neutrophils Relative %: 59 %
Platelets: 283 10*3/uL (ref 150–400)
RBC: 4.77 MIL/uL (ref 3.87–5.11)
RDW: 13.4 % (ref 11.5–15.5)
WBC: 9.2 10*3/uL (ref 4.0–10.5)
nRBC: 0 % (ref 0.0–0.2)

## 2021-07-13 LAB — LIPASE, BLOOD: Lipase: 27 U/L (ref 11–51)

## 2021-07-13 LAB — I-STAT BETA HCG BLOOD, ED (MC, WL, AP ONLY): I-stat hCG, quantitative: <5 m[IU]/mL (ref <5)

## 2021-07-13 MED ORDER — OXYCODONE-ACETAMINOPHEN 5-325 MG PO TABS: 1.0000 | ORAL_TABLET | Freq: Once | ORAL | Status: DC

## 2021-07-13 MED ORDER — ONDANSETRON 4 MG PO TBDP
4.0000 mg | ORAL_TABLET | Freq: Once | ORAL | Status: AC
  Administered 2021-07-13: 4 mg via ORAL
  Filled 2021-07-13: qty 1

## 2021-07-13 MED ORDER — LIDOCAINE VISCOUS HCL 2 % MT SOLN
15.0000 mL | Freq: Once | OROMUCOSAL | Status: AC
  Administered 2021-07-13: 15 mL via ORAL

## 2021-07-13 MED ORDER — FAMOTIDINE 20 MG PO TABS: 20.0000 mg | ORAL_TABLET | Freq: Once | ORAL | Status: DC

## 2021-07-13 MED ORDER — PANTOPRAZOLE SODIUM 40 MG PO PACK: 40.0000 mg | PACK | Freq: Every day | ORAL | 1 refills | Status: DC

## 2021-07-13 MED ORDER — ALUM & MAG HYDROXIDE-SIMETH 200-200-20 MG/5ML PO SUSP
30.0000 mL | Freq: Once | ORAL | Status: AC
  Administered 2021-07-13: 30 mL via ORAL

## 2021-07-13 NOTE — Discharge Instructions (Addendum)
We are prescribing pantoprazole to help treat your symptoms.  Use this instead of the omeprazole which you are currently taking.  Also for the next week, use an antacid such as Maalox or Mylanta, before meals and at bedtime, to help your symptoms. ?

## 2021-07-13 NOTE — ED Triage Notes (Signed)
Pt states that she has been having abd pain since yesterday with n/v, epigastric area.   ?

## 2021-07-13 NOTE — ED Provider Notes (Signed)
?[facility] ?Provider Note ? ? ?CSN: [id number] ?Arrival date & time: 07/13/21  0441 ? ?  ? ?History ? ?Chief Complaint  ?Patient presents with  ? Abdominal Pain  ? ? ?Shari Berger is a 46 y.o. female. ? ?HPI ?She is here for evaluation of left upper quadrant abdominal pain which radiates to her back, present since yesterday.  Initial evaluation at triage, including treatment with medications for pain. ?  ? ?Home Medications ?Prior to Admission medications   ?Medication Sig Start Date End Date Taking? Authorizing Provider  ?pantoprazole sodium (PROTONIX) 40 mg Place 40 mg into feeding tube daily. 07/13/21  Yes Daleen Bo, MD  ?acetaminophen (TYLENOL) 500 MG tablet Take 2 tablets (1,000 mg total) by mouth every 6 (six) hours. ?Patient taking differently: Take 1,000 mg by mouth every 8 (eight) hours as needed for mild pain or headache. 12/26/19   Anyanwu, Sallyanne Havers, MD  ?Cholecalciferol (VITAMIN D3) 50 MCG (2000 UT) capsule Take 1 capsule (2,000 Units total) by mouth daily. 09/11/20   Kerin Perna, NP  ?fluticasone (FLONASE) 50 MCG/ACT nasal spray Place 2 sprays into both nostrils daily. 09/10/20   Kerin Perna, NP  ?ibuprofen (ADVIL) 800 MG tablet TAKE 1 TABLET BY MOUTH EVERY 8 HOURS AS NEEDED ?Patient taking differently: Take 800 mg by mouth every 8 (eight) hours as needed for headache or moderate pain. 07/09/20   Kerin Perna, NP  ?Lidocaine (SALONPAS PAIN RELIEVING) 4 % PTCH Apply 1 each topically every 12 (twelve) hours. 02/10/21   Kommor, [place], MD  ?metoprolol tartrate (LOPRESSOR) 25 MG tablet Take 0.5 tablets (12.5 mg total) by mouth 2 (two) times daily for 1 dose. 01/26/21 01/27/21  Kerin Perna, NP  ?omeprazole (PRILOSEC) 20 MG capsule Take 1 capsule (20 mg total) by mouth daily. 01/26/21   Kerin Perna, NP  ?   ? ?Allergies    ?Patient has no known allergies.   ? ?Review of Systems   ?Review of Systems ? ?Physical Exam ?Updated Vital  Signs ?BP 114/76   Pulse 87   Temp 99.4 ?F (37.4 ?C) (Oral)   Resp 16   LMP 12/18/2019   SpO2 100%  ?Physical Exam ? ?ED Results / Procedures / Treatments   ?Labs ?(all labs ordered are listed, but only abnormal results are displayed) ?Labs Reviewed  ?COMPREHENSIVE METABOLIC PANEL - Abnormal; Notable for the following components:  ?    Result Value  ? CO2 20 (*)   ? Glucose, Bld 103 (*)   ? Calcium 8.7 (*)   ? Albumin 3.1 (*)   ? AST 9 (*)   ? All other components within normal limits  ?URINALYSIS, ROUTINE W REFLEX MICROSCOPIC - Abnormal; Notable for the following components:  ? APPearance HAZY (*)   ? All other components within normal limits  ?CBC WITH DIFFERENTIAL/PLATELET  ?LIPASE, BLOOD  ?I-STAT BETA HCG BLOOD, ED ([facility], [facility], [facility] ONLY)  ? ? ?EKG ?EKG Interpretation ? ?Date/Time:  Tuesday July 13 2021 05:02:22 EDT ?Ventricular Rate:  90 ?PR Interval:  152 ?QRS Duration: 70 ?QT Interval:  344 ?QTC Calculation: 420 ?R Axis:   123 ?Text Interpretation: Normal sinus rhythm Right atrial enlargement Right axis deviation Cannot rule out Anterior infarct , age undetermined Abnormal ECG When compared with ECG of 06-Feb-2020 16:31, PREVIOUS ECG IS PRESENT Since last tracing rate slower Otherwise no significant change Confirmed by Daleen Bo [phone]) on 07/13/2021 11:44:21 AM ? ?Radiology ?No results found. ? ?  Procedures ?Procedures  ? ? ?Medications Ordered in ED ?Medications  ?ondansetron (ZOFRAN-ODT) disintegrating tablet 4 mg (4 mg Oral Given 07/13/21 0525)  ?alum & mag hydroxide-simeth (MAALOX/MYLANTA) 200-200-20 MG/5ML suspension 30 mL (30 mLs Oral Given 07/13/21 0525)  ?  And  ?lidocaine (XYLOCAINE) 2 % viscous mouth solution 15 mL (15 mLs Oral Given 07/13/21 0525)  ? ? ?ED Course/ Medical Decision Making/ A&P ?  ?                        ?Medical Decision Making ?Patient was EPIC for epigastric discomfort which radiates to the left flank.  Currently taking omeprazole, without relief.  Symptoms primarily related to  sleeping as she notices it mostly in the morning.  No red flags. ? ?Problems Addressed: ?PUD (peptic ulcer disease): ?   Details: Onset of new symptoms despite taking omeprazole for "reflux."  She is taking over-the-counter medicine for 1 year, a personal choice. ? ?Amount and/or Complexity of Data Reviewed ?Independent Historian:  ?   Details: She is a cogent historian ?Labs: ordered. ?   Details: CBC, metabolic panel, lipase, urinalysis-normal except CO2 low, glucose high, calcium low, albumin low, AST low ?ECG/medicine tests: ordered and independent interpretation performed. ?   Details: Cardiac monitor-normal sinus rhythm ? ?Risk ?Prescription drug management. ?Decision regarding hospitalization. ?Risk Details: Presenting with symptoms consistent with gastritis versus PUD, with reassuring evaluation.  Doubt urinary tract source including kidney stones, pneumonia, ACS, pancreatitis.  She does not require further ED evaluation or hospitalization at this time.  Prescription for pantoprazole, to use instead of omeprazole.  Recommending to use antacid of choice before meals and at bedtime for a week.  Referred to GI for further evaluation and treatment as needed. ? ? ? ? ? ? ? ? ? ? ?Final Clinical Impression(s) / ED Diagnoses ?Final diagnoses:  ?PUD (peptic ulcer disease)  ? ? ?Rx / DC Orders ?ED Discharge Orders   ? ?      Ordered  ?  pantoprazole sodium (PROTONIX) 40 mg  Daily       ? 07/13/21 1237  ? ?  ?  ? ?  ? ? ?  ?Daleen Bo, MD ?07/13/21 1241 ? ?

## 2021-07-13 NOTE — ED Provider Triage Note (Signed)
Emergency Medicine Provider Triage Evaluation Note ? ?Shari Berger , a 46 y.o. female  was evaluated in triage.  Pt complains of abdominal pain since yesterday morning.  Intermittent pain to the epigastrium that radiates into the left upper quadrant/back area around the side.  Worse with eating or drinking.  No alleviating factors.  Had 1 episode of emesis yesterday with some associated nausea.  Prior C-section, no other abdominal surgeries.. ? ?Review of Systems  ?Positive: Abdominal pain, nausea, vomiting ?Negative: Diarrhea, dysuria, vaginal bleeding, vaginal discharge ? ?Physical Exam  ?BP 123/75 (BP Location: Left Arm)   Pulse 88   Temp 99.4 ?F (37.4 ?C) (Oral)   Resp 17   LMP 12/18/2019   SpO2 99%  ?Gen:   Awake, no distress   ?Resp:  Normal effort  ?MSK:   Moves extremities without difficulty  ?Other:  Epigastric & LUQ tenderness to palpation, negative murphys.  ? ?Medical Decision Making  ?Medically screening exam initiated at 5:14 AM.  Appropriate orders placed.  Shari Berger was informed that the remainder of the evaluation will be completed by another provider, this initial triage assessment does not replace that evaluation, and the importance of remaining in the ED until their evaluation is complete. ? ?Abdominal pain.  ?  Shari Dyke, PA-C ?07/13/21 9532 ? ?

## 2021-07-13 NOTE — ED Notes (Signed)
Pt alert, NAD, calm, interactive, resps e/u. Admits to mid upper abd pain, some radiation to L flank. Reports meds given earlier resolved pain, "knocked it out, 0/10", but it has gradually returned and is "mild", but rates 6-7/10.  ?

## 2021-07-29 ENCOUNTER — Encounter: Payer: Self-pay | Admitting: Nurse Practitioner

## 2021-07-29 ENCOUNTER — Ambulatory Visit (INDEPENDENT_AMBULATORY_CARE_PROVIDER_SITE_OTHER): Payer: Medicaid Other | Admitting: Nurse Practitioner

## 2021-07-29 VITALS — BP 120/78 | HR 91 | Ht 65.0 in | Wt 236.0 lb

## 2021-07-29 DIAGNOSIS — R1013 Epigastric pain: Secondary | ICD-10-CM | POA: Diagnosis not present

## 2021-07-29 DIAGNOSIS — K219 Gastro-esophageal reflux disease without esophagitis: Secondary | ICD-10-CM

## 2021-07-29 MED ORDER — OMEPRAZOLE 40 MG PO CPDR: 40.0000 mg | DELAYED_RELEASE_CAPSULE | Freq: Every day | ORAL | 3 refills | Status: DC

## 2021-07-29 NOTE — Progress Notes (Signed)
? ? ?Assessment  ? ?Patient profile:  ?Shari Berger is a 46 y.o. female new to the practice. . Past medical history significant for GERD / hiatal hernia, hysterectomy in November 2021, obesity. See PMH below for any additional history ? ?GERD with heartburn and regurgitation ?Currently untreated.  ? ?Epigastric pain radiating around left side to back.  ?Unclear etiology but resolved. No pain since being in ED a few weeks ago. Her lipase and other labs in ED were okay.  ? ? ?Plan  ? ?Etiology of pain unclear but no further workup need right now since pain was transient ?Discussed anti-reflux measures such as avoidance of late meals / bedtime snacks, HOB elevation (or use of wedge pillow), weight reduction ( if applicable)  / maintaining a healthy BMI ( body mass index),  and avoidance of trigger foods and caffeine.  ? She was not able to get Pantoprazole, sounds like there was problem with Medicaid coverage. Will try Omeprazole 40 mg q morning.  ?Follow up with me in 4 weeks. Call in interim if upper abdominal pain recurs.  ?Colon cancer screening after October 2023 ? ? ?History of Present Illness  ? ?Chief complaint: upper abdominal pain, GERD ? ? ?Shari Berger was seen in ED 4/18 for abdominal pain . Pain started a couple of days prior to going to ED. CBC, CMET and lipase were okay. UA was okay. Antacid recommended, GI evaluation recommended.  ? ?She gives a history of acid reflux with regurgitation / heartburn for about 6 years, especially in the evenings.  When she went to the ED she was having reflux symptoms but was also having epigastric pain radiating around left side to back for a couple of days. She vomited the same days she went to ED but otherwise no vomiting. . She seldom takes NSAIDS. No black stools.  She was given pantoprazole but there was a problems getting it ( insurance didn't cover?). Despite not getting the PPI she hasn't had any further upper abdominal pain since leaving ED.  ? ?No [facility] of colon  cancer.  ? ? ?Previous Labs / Imaging:: ? ?  Latest Ref Rng & Units 07/13/2021  ?  5:20 AM 02/10/2021  ?  9:55 AM 08/22/2020  ?  5:37 AM  ?CBC  ?WBC 4.0 - 10.5 K/uL 9.2   7.4   8.8    ?Hemoglobin 12.0 - 15.0 g/dL 14.1   12.9   13.0    ?Hematocrit 36.0 - 46.0 % 40.9   38.5   39.3    ?Platelets 150 - 400 K/uL 283   284   290    ? ? ?Lab Results  ?Component Value Date  ? LIPASE 27 07/13/2021  ? ? ?  Latest Ref Rng & Units 07/13/2021  ?  5:20 AM 02/10/2021  ?  9:55 AM 08/22/2020  ?  5:37 AM  ?CMP  ?Glucose 70 - 99 mg/dL 103   88   103    ?BUN 6 - 20 mg/dL '11   10   11    '$ ?Creatinine 0.44 - 1.00 mg/dL 0.68   0.67   0.73    ?Sodium 135 - 145 mmol/L 135   136   137    ?Potassium 3.5 - 5.1 mmol/L 4.0   3.8   3.6    ?Chloride 98 - 111 mmol/L 108   106   108    ?CO2 22 - 32 mmol/L '20   23   23    '$ ?  Calcium 8.9 - 10.3 mg/dL 8.7   8.5   8.7    ?Total Protein 6.5 - 8.1 g/dL 6.9   6.6     ?Total Bilirubin 0.3 - 1.2 mg/dL 0.7   0.6     ?Alkaline Phos 38 - 126 U/L 50   61     ?AST 15 - 41 U/L 9   8     ?ALT 0 - 44 U/L 17   17     ? ? ?Previous GI Evaluations  ? ?Endoscopies: ?none ? ? ?Imaging:  ?CT Renal Stone Study ?CLINICAL DATA:  Left flank pain.  Kidney stone suspected. ? ?EXAM: ?CT ABDOMEN AND PELVIS WITHOUT CONTRAST ? ?TECHNIQUE: ?Multidetector CT imaging of the abdomen and pelvis was performed ?following the standard protocol without IV contrast. ? ?COMPARISON:  02/06/2020 ? ?FINDINGS: ?Lower chest: Hiatal hernia.  Lung bases are clear. ? ?Hepatobiliary: Liver and gallbladder appear normal. ? ?Pancreas: Normal ? ?Spleen: Normal ? ?Adrenals/Urinary Tract: Adrenal glands are normal. Kidneys are ?normal. No cyst, mass, stone or hydronephrosis. No stone within ?either ureter. No stone in the bladder. Phleboliths in the pelvis. ? ?Stomach/Bowel: Hiatal hernia as noted above. Stomach and small ?intestine are otherwise normal. Normal appendix. No diverticulosis ?or diverticulitis. ? ?Vascular/Lymphatic: Normal ? ?Reproductive:  Normal ? ?Other: No free fluid or air. Previously seen pneumoperitoneum has ?resolved. ? ?Musculoskeletal: Normal ? ?IMPRESSION: ?Normal CT scan of the abdomen and pelvis other than the presence of ?a hiatal hernia. No evidence of urinary tract stone disease. ?Previously seen pneumoperitoneum has resolved. ? ?Electronically Signed ?  By: Nelson Chimes M.D. ?  On: 02/10/2021 10:59 ? ? ? ?Past Medical History:  ?Diagnosis Date  ? Acid reflux   ? ocassional  ? Anemia   ? Cluster headache   ? Fibroid   ? uterine fibroids  ? Migraines   ? ?Past Surgical History:  ?Procedure Laterality Date  ? ABDOMINAL HYSTERECTOMY N/A 12/24/2019  ? Procedure: HYSTERECTOMY ABDOMINAL;  Surgeon: Osborne Oman, MD;  Location: [facility];  Service: Gynecology;  Laterality: N/A;  ? LAPAROSCOPY N/A 12/24/2019  ? Procedure: diagnostic laparoscopy;  Surgeon: Osborne Oman, MD;  Location: [facility];  Service: Gynecology;  Laterality: N/A;  ? WISDOM TOOTH EXTRACTION    ? ?Family History  ?Problem Relation Age of Onset  ? Depression Mother   ? Heart disease Father   ? Diabetes Father   ? Hypertension Father   ? Colon cancer Neg Hx   ? Esophageal cancer Neg Hx   ? ?Social History  ? ?Tobacco Use  ? Smoking status: Never  ? Smokeless tobacco: Never  ?Vaping Use  ? Vaping Use: Former  ?Substance Use Topics  ? Alcohol use: Yes  ?  Comment: ocassional  ? Drug use: No  ? ?Current Outpatient Medications  ?Medication Sig Dispense Refill  ? acetaminophen (TYLENOL) 500 MG tablet Take 2 tablets (1,000 mg total) by mouth every 6 (six) hours. (Patient taking differently: Take 1,000 mg by mouth every 8 (eight) hours as needed for mild pain or headache.) 30 tablet 2  ? ibuprofen (ADVIL) 800 MG tablet TAKE 1 TABLET BY MOUTH EVERY 8 HOURS AS NEEDED (Patient taking differently: Take 800 mg by mouth every 8 (eight) hours as needed for headache or moderate pain.) 90 tablet 1  ? ?No current facility-administered medications for this visit.  ? ?No Known  Allergies ? ? ?Review of Systems: ?Positive for back pain, headaches, allergies.  All other systems reviewed  and negative except where noted in HPI.  ? ?Physical Exam  ? ?Wt Readings from Last 3 Encounters:  ?07/29/21 236 lb (107 kg)  ?01/26/21 241 lb 12.8 oz (109.7 kg)  ?09/10/20 222 lb (100.7 kg)  ? ? ?BP 120/78   Pulse 91   Ht '5\' 5"'$  (1.651 m)   Wt 236 lb (107 kg)   LMP 12/18/2019   BMI 39.27 kg/m?  ?Constitutional:  Pleasant , obese female in no acute distress. ?Psychiatric:  Normal mood and affect. Behavior is normal. ?EENT: Pupils normal.  Conjunctivae are normal. No scleral icterus. ?Neck supple.  ?Cardiovascular: Normal rate, regular rhythm. No edema ?Pulmonary/chest: Effort normal and breath sounds normal. No wheezing, rales or rhonchi. ?Abdominal: Soft, nondistended, nontender. Bowel sounds active throughout. There are no masses palpable. No hepatomegaly. ?Neurological: Alert and oriented to person place and time. ?Skin: Skin is warm and dry. No rashes noted. ? ?Tye Savoy, NP  07/29/2021, 2:01 PM ? ? ? ? ? ? ? ? ? ?

## 2021-07-29 NOTE — Progress Notes (Signed)
Reviewed and agree with management plans. ? ?Darien Mignogna L. Jaclene Bartelt, MD, MPH  ?

## 2021-07-29 NOTE — Patient Instructions (Addendum)
Acid Reflux ?--If you are taking anti-reflux ( GERD) medication be sure to take it 30 minutes before breakfast and if taking twice daily then also 30 minutes before dinner.   ?--Avoid late meals / bedtime snacks.   ?--Avoid trigger foods ( foods which you know tend to aggravate you reflux symptoms). Some of the more common triggers include spicy foods, fatty foods, acidic foods, chocolate and caffeine.  ?--Elevate the head of bed 6-8 inches on blocks or bricks. If not able to elevate the head of the bed consider purchasing a wedge pillow to sleep on.    ?--Weight reduction / maintain a healthy BMI ( body mass index) may be help with reflux symptoms  ?---Sometimes with the above mentioned "lifestyle changes" patients are able to reduce the amount of GERD medications they take. Our goal would be to have you on the lowest effective dose of medication ? ? ?We have sent the following medications to your pharmacy for you to pick up at your convenience: Omeprazole 40 mg ? ?If you are age 46 or older, your body mass index should be between 23-30. Your Body mass index is 39.27 kg/m?Marland Kitchen If this is out of the aforementioned range listed, please consider follow up with your Primary Care Provider. ? ?If you are age 69 or younger, your body mass index should be between 19-25. Your Body mass index is 39.27 kg/m?Marland Kitchen If this is out of the aformentioned range listed, please consider follow up with your Primary Care Provider.  ? ?________________________________________________________ ? ?The [facility] providers would like to encourage you to use [facility] to communicate with providers for non-urgent requests or questions.  Due to long hold times on the telephone, sending your provider a message by [facility] may be a faster and more efficient way to get a response.  Please allow 48 business hours for a response.  Please remember that this is for non-urgent requests.  ?_______________________________________________________  ?

## 2021-08-27 ENCOUNTER — Ambulatory Visit: Payer: Medicaid Other | Admitting: Nurse Practitioner

## 2021-09-21 ENCOUNTER — Encounter: Payer: Self-pay | Admitting: Gastroenterology

## 2021-09-21 ENCOUNTER — Ambulatory Visit (INDEPENDENT_AMBULATORY_CARE_PROVIDER_SITE_OTHER): Payer: Medicaid Other | Admitting: Gastroenterology

## 2021-09-21 VITALS — BP 140/90 | HR 85 | Ht 65.0 in | Wt 234.0 lb

## 2021-09-21 DIAGNOSIS — R112 Nausea with vomiting, unspecified: Secondary | ICD-10-CM | POA: Diagnosis not present

## 2021-09-21 DIAGNOSIS — K219 Gastro-esophageal reflux disease without esophagitis: Secondary | ICD-10-CM

## 2021-09-21 DIAGNOSIS — R1013 Epigastric pain: Secondary | ICD-10-CM

## 2021-10-06 ENCOUNTER — Ambulatory Visit (INDEPENDENT_AMBULATORY_CARE_PROVIDER_SITE_OTHER): Payer: Medicaid Other | Admitting: Primary Care

## 2021-10-06 ENCOUNTER — Encounter (INDEPENDENT_AMBULATORY_CARE_PROVIDER_SITE_OTHER): Payer: Self-pay | Admitting: Primary Care

## 2021-10-06 VITALS — BP 120/78 | HR 99 | Temp 98.1°F | Ht 65.0 in | Wt 234.2 lb

## 2021-10-06 DIAGNOSIS — E559 Vitamin D deficiency, unspecified: Secondary | ICD-10-CM | POA: Diagnosis not present

## 2021-10-06 DIAGNOSIS — M545 Low back pain, unspecified: Secondary | ICD-10-CM

## 2021-10-06 DIAGNOSIS — E6609 Other obesity due to excess calories: Secondary | ICD-10-CM | POA: Diagnosis not present

## 2021-10-06 DIAGNOSIS — G8929 Other chronic pain: Secondary | ICD-10-CM

## 2021-10-06 DIAGNOSIS — Z6839 Body mass index (BMI) 39.0-39.9, adult: Secondary | ICD-10-CM

## 2021-10-06 MED ORDER — IBUPROFEN 600 MG PO TABS: 600.0000 mg | ORAL_TABLET | Freq: Three times a day (TID) | ORAL | 1 refills | Status: DC | PRN

## 2021-10-06 NOTE — Patient Instructions (Signed)
Chronic Back Pain When back pain lasts longer than 3 months, it is called chronic back pain. Pain may get worse at certain times (flare-ups). There are things you can do at home to manage your pain. Follow these instructions at home: Pay attention to any changes in your symptoms. Take these actions to help with your pain: Managing pain and stiffness     If told, put ice on the painful area. Your doctor may tell you to use ice for 24-48 hours after the flare-up starts. To do this: Put ice in a plastic bag. Place a towel between your skin and the bag. Leave the ice on for 20 minutes, 2-3 times a day. If told, put heat on the painful area. Do this as often as told by your doctor. Use the heat source that your doctor recommends, such as a moist heat pack or a heating pad. Place a towel between your skin and the heat source. Leave the heat on for 20-30 minutes. Take off the heat if your skin turns bright red. This is especially important if you are unable to feel pain, heat, or cold. You may have a greater risk of getting burned. Soak in a warm bath. This can help relieve pain. Activity  Avoid bending and other activities that make pain worse. When standing: Keep your upper back and neck straight. Keep your shoulders pulled back. Avoid slouching. When sitting: Keep your back straight. Relax your shoulders. Do not round your shoulders or pull them backward. Do not sit or stand in one place for long periods of time. Take short rest breaks during the day. Lying down or standing is usually better than sitting. Resting can help relieve pain. When sitting or lying down for a long time, do some mild activity or stretching. This will help to prevent stiffness and pain. Get regular exercise. Ask your doctor what activities are safe for you. Do not lift anything that is heavier than 10 lb (4.5 kg) or the limit that you are told, until your doctor says that it is safe. To prevent injury when you lift  things: Bend your knees. Keep the weight close to your body. Avoid twisting. Sleep on a firm mattress. Try lying on your side with your knees slightly bent. If you lie on your back, put a pillow under your knees. Medicines Treatment may include medicines for pain and swelling taken by mouth or put on the skin, prescription pain medicine, or muscle relaxants. Take over-the-counter and prescription medicines only as told by your doctor. Ask your doctor if the medicine prescribed to you: Requires you to avoid driving or using machinery. Can cause trouble pooping (constipation). You may need to take these actions to prevent or treat trouble pooping: Drink enough fluid to keep your pee (urine) pale yellow. Take over-the-counter or prescription medicines. Eat foods that are high in fiber. These include beans, whole grains, and fresh fruits and vegetables. Limit foods that are high in fat and sugars. These include fried or sweet foods. General instructions Do not use any products that contain nicotine or tobacco, such as cigarettes, e-cigarettes, and chewing tobacco. If you need help quitting, ask your doctor. Keep all follow-up visits as told by your doctor. This is important. Contact a doctor if: Your pain does not get better with rest or medicine. Your pain gets worse, or you have new pain. You have a high fever. You lose weight very quickly. You have trouble doing your normal activities. Get help right away   if: One or both of your legs or feet feel weak. One or both of your legs or feet lose feeling (have numbness). You have trouble controlling when you poop (have a bowel movement) or pee (urinate). You have bad back pain and: You feel like you may vomit (nauseous), or you vomit. You have pain in your belly (abdomen). You have shortness of breath. You faint. Summary When back pain lasts longer than 3 months, it is called chronic back pain. Pain may get worse at certain times  (flare-ups). Use ice and heat as told by your doctor. Your doctor may tell you to use ice after flare-ups. This information is not intended to replace advice given to you by your health care provider. Make sure you discuss any questions you have with your health care provider. Document Revised: 04/24/2019 Document Reviewed: 04/24/2019 Elsevier Patient Education  2023 Elsevier Inc.  

## 2021-10-06 NOTE — Progress Notes (Signed)
[facility]  Subjective: CC: back pain PCP: Kerin Perna, NP HPI: Patient is a 46 y.o. female presenting to clinic today for back pain. Concerns today include:  1. Back Pain Patient reports that pain began a year ago.  She has a h/o back pain.  Pain is a 9/10.  It does radiate bilateral legs . Waking, standing , bending, climbing stairs trying to find a comfortable position to sleep  worsens pain.  Messages and tylenol improves pain.  Patient has been taking Tylenol '350mg'$  (2)  for pain with little relief.  Patient denies trauma or injury.  She denies dysuria, hematuria, fevers, chills, nausea, vomiting, abdominal pain, renal stones. Aggravating factors: certain movements and prolonged walking/standing. Alleviating factors: rest. Progressive LE weakness or saddle anesthesia: none. Extremity sensation changes or weakness: none. Ambulatory without difficulty. Normal bowel/bladder habits: yes; without urinary retention. Normal PO intake without n/v. No associated abdominal pain/n/v. Self treatment: has OTC analgesics, with minimal relief. Patient denies: urinary retention/incontinence, bowel incontinence, weakness, falls, sensation changes or pain anywhere else. No h/o back surgeries. Patient has No headache, No chest pain, No abdominal pain - No Nausea, No new weakness tingling or numbness, No Cough - shortness of breath     Current Outpatient Medications:    acetaminophen (TYLENOL) 500 MG tablet, Take 2 tablets (1,000 mg total) by mouth every 6 (six) hours. (Patient taking differently: Take 1,000 mg by mouth every 8 (eight) hours as needed for mild pain or headache.), Disp: 30 tablet, Rfl: 2   omeprazole (PRILOSEC) 40 MG capsule, Take 1 capsule (40 mg total) by mouth daily. 30 minutes before breakfast, Disp: 30 capsule, Rfl: 3 No Known Allergies  Past Medical History:  Diagnosis Date   Acid reflux    ocassional   Anemia    Cluster headache    Fibroid    uterine  fibroids   Migraines    Social History   Socioeconomic History   Marital status: Single    Spouse name: Not on file   Number of children: 1   Years of education: Not on file   Highest education level: 12th grade  Occupational History   Occupation: [employer]  Tobacco Use   Smoking status: Never   Smokeless tobacco: Never  Vaping Use   Vaping Use: Former  Substance and Sexual Activity   Alcohol use: Yes    Comment: ocassional   Drug use: No   Sexual activity: Yes    Partners: Male    Birth control/protection: None  Other Topics Concern   Not on file  Social History Narrative   Not on file   Social Determinants of Health   Financial Resource Strain: Not on file  Food Insecurity: No Food Insecurity (11/25/2019)   Hunger Vital Sign    Worried About Running Out of Food in the Last Year: Never true    Ran Out of Food in the Last Year: Never true  Transportation Needs: No Transportation Needs (11/25/2019)   PRAPARE - Hydrologist (Medical): No    Lack of Transportation (Non-Medical): No  Physical Activity: Not on file  Stress: Not on file  Social Connections: Not on file  Intimate Partner Violence: Not on file   Past Surgical History:  Procedure Laterality Date   ABDOMINAL HYSTERECTOMY N/A 12/24/2019   Procedure: HYSTERECTOMY ABDOMINAL;  Surgeon: Osborne Oman, MD;  Location: [place];  Service: Gynecology;  Laterality: N/A;   LAPAROSCOPY N/A 12/24/2019  Procedure: diagnostic laparoscopy;  Surgeon: Osborne Oman, MD;  Location: [place];  Service: Gynecology;  Laterality: N/A;   WISDOM TOOTH EXTRACTION      ROS: per HPI  Objective: Office vital signs reviewed. BP 120/78   Pulse 99   Temp 98.1 F (36.7 C) (Oral)   Ht '5\' 5"'$  (1.651 m)   Wt 234 lb 3.2 oz (106.2 kg)   LMP 12/18/2019   SpO2 96%   BMI 38.97 kg/m   Physical Examination:  General: No apparent distress. Eyes: Extraocular eye movements intact, pupils equal and round. Neck:  Supple, trachea midline. Thyroid: No enlargement, mobile without fixation, no tenderness. Cardiovascular: Regular rhythm and rate, no murmur, normal radial pulses. Respiratory: Normal respiratory effort, clear to auscultation. Gastrointestinal: Normal pitch active bowel sounds, nontender abdomen without distention or appreciable hepatomegaly. Musculoskeletal: Normal muscle tone, tenderness on palpation L4-S1, no excessive thoracic kyphosis. Skin: Appropriate warmth, no visible rash. Mental status: Alert, conversant, speech clear, thought logical, appropriate mood and affect, no hallucinations or delusions evident. Hematologic/lymphatic: No cervical adenopathy, no visible ecchymoses.   Assessment/ Plan: Nemiah was seen today for back pain.  Diagnoses and all orders for this visit:  Chronic bilateral low back pain without sciatica Tried physical therapy she did not feel it helped  Ambulatory referral to Neurosurgery   Vitamin D deficiency Vitamin D deficiency  has a increased risk of fractures, falls and osteoporosis.  With a decline in milk products, lack of vitamin D through the sunlight by using sunscreen this is fundamental to muscle skeletal.  Naturally found in fatty fish. Take 2000 iu daily   Class 2 obesity due to excess calories without serious comorbidity with body mass index (BMI) of 39.0 to 39.9 in adult Obesity is 30-39 indicating an excess in caloric intake or underlining conditions. This may lead to other co-morbidities. Lifestyle modifications of diet and exercise may reduce obesity.      The above assessment and management plan was discussed with the patient. The patient verbalized understanding of and has agreed to the management plan. Patient is aware to call the clinic if symptoms persist or worsen. Patient is aware when to return to the clinic for a follow-up visit. Patient educated on when it is appropriate to go to the emergency department.   This note has been  created with [employer], quantity. Any transcriptional errors are unintentional.   Kerin Perna, NP 10/06/2021, 3:40 PM

## 2021-10-29 ENCOUNTER — Encounter: Payer: Self-pay | Admitting: Gastroenterology

## 2021-10-29 ENCOUNTER — Ambulatory Visit (AMBULATORY_SURGERY_CENTER): Payer: Medicaid Other | Admitting: Gastroenterology

## 2021-10-29 VITALS — BP 139/82 | HR 79 | Temp 98.0°F | Resp 12 | Ht 65.0 in | Wt 236.0 lb

## 2021-10-29 DIAGNOSIS — K2950 Unspecified chronic gastritis without bleeding: Secondary | ICD-10-CM

## 2021-10-29 DIAGNOSIS — R12 Heartburn: Secondary | ICD-10-CM | POA: Diagnosis not present

## 2021-10-29 DIAGNOSIS — K219 Gastro-esophageal reflux disease without esophagitis: Secondary | ICD-10-CM

## 2021-10-29 DIAGNOSIS — K449 Diaphragmatic hernia without obstruction or gangrene: Secondary | ICD-10-CM | POA: Diagnosis not present

## 2021-10-29 DIAGNOSIS — B9681 Helicobacter pylori [H. pylori] as the cause of diseases classified elsewhere: Secondary | ICD-10-CM

## 2021-10-29 DIAGNOSIS — R1013 Epigastric pain: Secondary | ICD-10-CM

## 2021-10-29 MED ORDER — SODIUM CHLORIDE 0.9 % IV SOLN: 500.0000 mL | INTRAVENOUS | Status: DC

## 2021-10-29 NOTE — Op Note (Signed)
[facility] Patient Name: Shari Berger Procedure Date: 10/29/2021 10:16 AM MRN: [id number] Endoscopist: Thornton Park MD, MD Age: 46 Referring MD:  Date of Birth: 12-09-1975 Gender: Female Account #: [id number] Procedure:                Upper GI endoscopy Indications:              Epigastric abdominal pain, Heartburn, Nausea with                            vomiting Medicines:                Monitored Anesthesia Care Procedure:                Pre-Anesthesia Assessment:                           - Prior to the procedure, a History and Physical                            was performed, and patient medications and                            allergies were reviewed. The patient's tolerance of                            previous anesthesia was also reviewed. The risks                            and benefits of the procedure and the sedation                            options and risks were discussed with the patient.                            All questions were answered, and informed consent                            was obtained. Prior Anticoagulants: The patient has                            taken no previous anticoagulant or antiplatelet                            agents. ASA Grade Assessment: III - A patient with                            severe systemic disease. After reviewing the risks                            and benefits, the patient was deemed in                            satisfactory condition to undergo the procedure.  After obtaining informed consent, the endoscope was                            passed under direct vision. Throughout the                            procedure, the patient's blood pressure, pulse, and                            oxygen saturations were monitored continuously. The                            Endoscope was introduced through the mouth, and                            advanced to the third part of duodenum.  The upper                            GI endoscopy was accomplished without difficulty.                            The patient tolerated the procedure well. Scope In: Scope Out: Findings:                 The examined esophagus was normal. Biopsies were                            taken from the mid, proximal, and distal esophagus                            with a cold forceps for histology. Estimated blood                            loss was minimal.                           Patchy mildly erythematous mucosa without bleeding                            was found in the gastric body. Biopsies were taken                            from the antrum, body, and fundus with a cold                            forceps for histology. Estimated blood loss was                            minimal.                           A small hiatal hernia was present.                           The examined duodenum was  normal. Biopsies were                            taken with a cold forceps for histology. Estimated                            blood loss was minimal.                           The cardia and gastric fundus were normal on                            retroflexion.                           The exam was otherwise without abnormality. Complications:            No immediate complications. Estimated Blood Loss:     Estimated blood loss was minimal. Impression:               - Normal esophagus. Biopsied.                           - Erythematous mucosa in the gastric body. Biopsied.                           - Small hiatal hernia.                           - Normal examined duodenum. Biopsied.                           - The examination was otherwise normal. Recommendation:           - Patient has a contact number available for                            emergencies. The signs and symptoms of potential                            delayed complications were discussed with the                             patient. Return to normal activities tomorrow.                            Written discharge instructions were provided to the                            patient.                           - Resume previous diet.                           - Continue present medications. Increase omeprazole  to 40 mg BID.                           - Await pathology results.                           - No aspirin, ibuprofen, naproxen, or other                            non-steroidal anti-inflammatory drugs.                           - Office follow-up to review these results and                            monitor response to higher dose of omeprazole. Thornton Park MD, MD 10/29/2021 10:49:07 AM This report has been signed electronically.

## 2021-10-29 NOTE — Progress Notes (Signed)
Called to room to assist during endoscopic procedure.  Patient ID and intended procedure confirmed with present staff. Received instructions for my participation in the procedure from the performing physician.  

## 2021-10-29 NOTE — Progress Notes (Signed)
   Referring Provider: Kerin Perna, NP Primary Care Physician:  Kerin Perna, NP  Indication for Procedure: Reflux, epigastric abdominal pain, episodic nausea and vomiting   IMPRESSION:  Nausea and vomiting Epigastric abdominal pain Reflux Hiatal hernia seen on recent CT scan Appropriate candidate for monitored anesthesia care  PLAN: EGD in the [facility] today   HPI: Shari Berger is a 46 y.o. female presents for endoscopic evaluation for reflux, epigastric abdominal pain, and episodic nausea and vomiting.  She is experiencing some improvement on omeprazole but continues to have nausea and vomiting.  CBC, CMP, and lipase have been normal.  Renal protocol CT 02/10/2021 showed a hiatal hernia but was otherwise normal.  Past Medical History:  Diagnosis Date   Acid reflux    ocassional   Anemia    Cluster headache    Fibroid    uterine fibroids   Migraines     Past Surgical History:  Procedure Laterality Date   ABDOMINAL HYSTERECTOMY N/A 12/24/2019   Procedure: HYSTERECTOMY ABDOMINAL;  Surgeon: Osborne Oman, MD;  Location: [place];  Service: Gynecology;  Laterality: N/A;   LAPAROSCOPY N/A 12/24/2019   Procedure: diagnostic laparoscopy;  Surgeon: Osborne Oman, MD;  Location: [place];  Service: Gynecology;  Laterality: N/A;   WISDOM TOOTH EXTRACTION      Current Outpatient Medications  Medication Sig Dispense Refill   acetaminophen (TYLENOL) 500 MG tablet Take 2 tablets (1,000 mg total) by mouth every 6 (six) hours. (Patient taking differently: Take 1,000 mg by mouth every 8 (eight) hours as needed for mild pain or headache.) 30 tablet 2   omeprazole (PRILOSEC) 40 MG capsule Take 1 capsule (40 mg total) by mouth daily. 30 minutes before breakfast 30 capsule 3   ibuprofen (ADVIL) 600 MG tablet Take 1 tablet (600 mg total) by mouth every 8 (eight) hours as needed. 90 tablet 1   Current Facility-Administered Medications  Medication Dose Route Frequency Provider  Last Rate Last Admin   0.9 %  sodium chloride infusion  500 mL Intravenous Continuous Thornton Park, MD        Allergies as of 10/29/2021   (No Known Allergies)    Family History  Problem Relation Age of Onset   Depression Mother    Heart disease Father    Diabetes Father    Hypertension Father    Colon cancer Neg Hx    Esophageal cancer Neg Hx      Physical Exam: General:   Alert,  well-nourished, pleasant and cooperative in NAD Head:  Normocephalic and atraumatic. Eyes:  Sclera clear, no icterus.   Conjunctiva pink. Mouth:  No deformity or lesions.   Neck:  Supple; no masses or thyromegaly. Lungs:  Clear throughout to auscultation.   No wheezes. Heart:  Regular rate and rhythm; no murmurs. Abdomen:  Soft, non-tender, nondistended, normal bowel sounds, no rebound or guarding.  Msk:  Symmetrical. No boney deformities LAD: No inguinal or umbilical LAD Extremities:  No clubbing or edema. Neurologic:  Alert and  oriented x4;  grossly nonfocal Skin:  No obvious rash or bruise. Psych:  Alert and cooperative. Normal mood and affect.     Studies/Results: No results found.    Tassie Pollett L. Tarri Glenn, MD, MPH 10/29/2021, 9:26 AM

## 2021-10-29 NOTE — Patient Instructions (Signed)
  NO ASPIRIN, IBUPROFEN OR OTHER NON-STEROIDAL ANTI-INFLAMMATORY DRUGS.   INCREASE OMEPRAZOLE TO '40MG'$  TWICE A DAY.    YOU HAD AN ENDOSCOPIC PROCEDURE TODAY AT [facility]:   Refer to the procedure report that was given to you for any specific questions about what was found during the examination.  If the procedure report does not answer your questions, please call your gastroenterologist to clarify.  If you requested that your care partner not be given the details of your procedure findings, then the procedure report has been included in a sealed envelope for you to review at your convenience later.  YOU SHOULD EXPECT: Some feelings of bloating in the abdomen. Passage of more gas than usual.  Walking can help get rid of the air that was put into your GI tract during the procedure and reduce the bloating. If you had a lower endoscopy (such as a colonoscopy or flexible sigmoidoscopy) you may notice spotting of blood in your stool or on the toilet paper. If you underwent a bowel prep for your procedure, you may not have a normal bowel movement for a few days.  Please Note:  You might notice some irritation and congestion in your nose or some drainage.  This is from the oxygen used during your procedure.  There is no need for concern and it should clear up in a day or so.  SYMPTOMS TO REPORT IMMEDIATELY:    Following upper endoscopy (EGD)  Vomiting of blood or coffee ground material  New chest pain or pain under the shoulder blades  Painful or persistently difficult swallowing  New shortness of breath  Fever of 100F or higher  Black, tarry-looking stools  For urgent or emergent issues, a gastroenterologist can be reached at any hour by calling [phone]. Do not use MyChart messaging for urgent concerns.    DIET:  We do recommend a small meal at first, but then you may proceed to your regular diet.  Drink plenty of fluids but you should avoid alcoholic beverages for 24  hours.  ACTIVITY:  You should plan to take it easy for the rest of today and you should NOT DRIVE or use heavy machinery until tomorrow (because of the sedation medicines used during the test).    FOLLOW UP: Our staff will call the number listed on your records the next business day following your procedure.  We will call around 7:15- 8:00 am to check on you and address any questions or concerns that you may have regarding the information given to you following your procedure. If we do not reach you, we will leave a message.  If you develop any symptoms (ie: fever, flu-like symptoms, shortness of breath, cough etc.) before then, please call [phone].  If you test positive for Covid 19 in the 2 weeks post procedure, please call and report this information to [place].    If any biopsies were taken you will be contacted by phone or by letter within the next 1-3 weeks.  Please call us at [phone] if you have not heard about the biopsies in 3 weeks.    SIGNATURES/CONFIDENTIALITY: You and/or your care partner have signed paperwork which will be entered into your electronic medical record.  These signatures attest to the fact that that the information above on your After Visit Summary has been reviewed and is understood.  Full responsibility of the confidentiality of this discharge information lies with you and/or your care-partner.

## 2021-10-29 NOTE — Progress Notes (Signed)
A and O x3. Report to RN. Tolerated MAC anesthesia well.Teeth unchanged after procedure. 

## 2021-10-29 NOTE — Progress Notes (Signed)
Pt's states no medical or surgical changes since previsit or office visit. 

## 2021-11-01 ENCOUNTER — Telehealth: Payer: Self-pay

## 2021-11-01 NOTE — Telephone Encounter (Signed)
-----   Message from Thornton Park, MD sent at 10/29/2021 10:43 AM EDT ----- Regarding: Office follow-up Please arrange office follow-up with [facility] in 3 to 4 weeks.  Thank you.  KLB

## 2021-11-01 NOTE — Telephone Encounter (Signed)
Pt scheduled for f/u with Nevin Bloodgood, NP on 12/01/21 at 10:00 am. Pt notified via mychart.

## 2021-11-01 NOTE — Telephone Encounter (Signed)
Attempted to reach pt with follow-up call following endoscopic procedure 10/29/2021.  LM On pt. Voice mail to call if she has any questions or concerns.

## 2021-11-05 ENCOUNTER — Other Ambulatory Visit: Payer: Self-pay

## 2021-11-08 ENCOUNTER — Telehealth: Payer: Self-pay | Admitting: Gastroenterology

## 2021-11-08 ENCOUNTER — Other Ambulatory Visit: Payer: Self-pay

## 2021-11-08 DIAGNOSIS — A048 Other specified bacterial intestinal infections: Secondary | ICD-10-CM

## 2021-11-08 MED ORDER — METRONIDAZOLE 500 MG PO TABS: 500.0000 mg | ORAL_TABLET | Freq: Four times a day (QID) | ORAL | 0 refills | Status: AC

## 2021-11-08 MED ORDER — TETRACYCLINE HCL 500 MG PO CAPS: 500.0000 mg | ORAL_CAPSULE | Freq: Four times a day (QID) | ORAL | 0 refills | Status: AC

## 2021-11-08 MED ORDER — BISMUTH SUBSALICYLATE 262 MG PO TABS: 262.0000 mg | ORAL_TABLET | Freq: Four times a day (QID) | ORAL | 0 refills | Status: AC

## 2021-11-08 NOTE — Telephone Encounter (Signed)
Refer to path note.

## 2021-11-08 NOTE — Telephone Encounter (Signed)
Patient returned your call. Please call to advise.

## 2021-11-11 ENCOUNTER — Other Ambulatory Visit (HOSPITAL_COMMUNITY): Payer: Self-pay

## 2021-11-11 ENCOUNTER — Telehealth: Payer: Self-pay | Admitting: Pharmacy Technician

## 2021-11-11 NOTE — Telephone Encounter (Signed)
Patient Advocate Encounter  Received notification from [place] that prior authorization for TETRACYCLINE '500MG'$  is required.   PA submitted on 8.17.23 Key [id number] Status is pending    Luciano Cutter, CPhT Patient Advocate Phone: [phone]

## 2021-11-12 ENCOUNTER — Other Ambulatory Visit (HOSPITAL_COMMUNITY): Payer: Self-pay

## 2021-11-12 NOTE — Telephone Encounter (Signed)
Received a fax regarding Prior Authorization from [facility] for TETRACYCLINE '500MG'$ . Authorization has been DENIED because PLEASE SEE BELOW.

## 2021-11-17 ENCOUNTER — Other Ambulatory Visit: Payer: Self-pay | Admitting: Neurosurgery

## 2021-11-17 ENCOUNTER — Other Ambulatory Visit (HOSPITAL_COMMUNITY): Payer: Self-pay

## 2021-11-17 DIAGNOSIS — M4317 Spondylolisthesis, lumbosacral region: Secondary | ICD-10-CM

## 2021-11-17 NOTE — Telephone Encounter (Signed)
This required clinical review with other H Pylori treatments. I am adding my pharmacist for assistance in other alternatives that may be available. I can then test bill to see if patient insurance will cover those alternatives.

## 2021-11-23 ENCOUNTER — Other Ambulatory Visit: Payer: Self-pay

## 2021-11-23 MED ORDER — AMOXICILLIN 500 MG PO CAPS: 500.0000 mg | ORAL_CAPSULE | Freq: Four times a day (QID) | ORAL | 0 refills | Status: AC

## 2021-11-23 MED ORDER — METRONIDAZOLE 250 MG PO TABS: 250.0000 mg | ORAL_TABLET | Freq: Four times a day (QID) | ORAL | 0 refills | Status: AC

## 2021-11-23 MED ORDER — CLARITHROMYCIN 500 MG PO TABS: 500.0000 mg | ORAL_TABLET | Freq: Two times a day (BID) | ORAL | 0 refills | Status: AC

## 2021-11-23 NOTE — Telephone Encounter (Signed)
Received verbal order from Dr. Tarri Glenn for the updated prescriptions to be sent to pharmacy. Patient is aware.

## 2021-11-28 ENCOUNTER — Ambulatory Visit
Admission: RE | Admit: 2021-11-28 | Discharge: 2021-11-28 | Disposition: A | Discharge status: Home / Self Care | Payer: Medicaid Other | Source: Ambulatory Visit | Attending: Neurosurgery | Admitting: Neurosurgery

## 2021-11-28 DIAGNOSIS — M4317 Spondylolisthesis, lumbosacral region: Secondary | ICD-10-CM

## 2021-12-01 ENCOUNTER — Ambulatory Visit: Payer: Medicaid Other | Admitting: Nurse Practitioner

## 2021-12-01 NOTE — Progress Notes (Deleted)
Chief Complaint:  ***   Assessment &  Plan      HPI   Shari Berger is a 46 y.o. female known to Dr.  Rhunette Berger with a past medical history significant for X. See PMH /PSH for additional history   I saw patient in early May of this year when she establish care at this practice for evaluation of GERD and epigastric pain. She was started on omeprazole. She followed up with Shari Bogus, PA in late June, still having reflux, epigastric pain and periodic episodes of nausea and vomiting.  She was scheduled for a EGD with biopsies, see results below.  Essentially she had a small hiatal hernia, H.pylori gastritis.  Esophagus including biopsies was normal. H.Pylori regimen started.    Pylera for 10 days or Helidac for 14 days, based on insurance coverage. If neither of those is preferred, please substitute with 14 days of treatment with bismuth subsalicylate 272 mg 4 times daily daily, metronidazole 500 mg 4 times daily, tetracycline 500 mg 4 times daily, and pantoprazole 40 mg twice daily.  I recommend that the patient abstain from all alcohol while taking metronidazole.  Eight weeks after completing the treatment, an H pylori stool antigen should be performed to monitor for response to treatment.  Interval History:      Previous GI Evaluation  EGD - Normal esophagus. Biopsied. - Erythematous mucosa in the gastric body. Biopsied. - Small hiatal hernia. - Normal examined duodenum. Biopsied. - The examination was otherwise normal.  1. Surgical [P], duodenal bulb, 2nd portion of duodenum, and distal duodenum FRAGMENTS OF NORMAL DUODENAL MUCOSA. THERE ARE NO DIAGNOSTIC FEATURES OF CELIAC DISEASE. 2. 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 NEGATIVE FOR DYSPLASIA AND MALIGNANCY. THE FOLLOWING IMMUNOSTAIN IS PERFORMED WITH APPROPRIATE CONTROL: H. PYLORI:  POSITIVE. 3. Surgical [P], mid esophagus and distal esophagus FRAGMENTS OF NORMAL ESOPHAGEAL MUCOSA. THERE ARE NO DIAGNOSTIC FEATURES OF BARRETT'S ESOPHAGUS AND EOSINOPHILIC ESOPHAGITIS.   Imaging     Labs:     Latest Ref Rng & Units 07/13/2021    5:20 AM 02/10/2021    9:55 AM 08/22/2020    5:37 AM  CBC  WBC 4.0 - 10.5 K/uL 9.2  7.4  8.8   Hemoglobin 12.0 - 15.0 g/dL 14.1  12.9  13.0   Hematocrit 36.0 - 46.0 % 40.9  38.5  39.3   Platelets 150 - 400 K/uL 283  284  290        Latest Ref Rng & Units 07/13/2021    5:20 AM 02/10/2021    9:55 AM 02/06/2020    6:15 PM  Hepatic Function  Total Protein 6.5 - 8.1 g/dL 6.9  6.6  6.5   Albumin 3.5 - 5.0 g/dL 3.1  2.9  2.8   AST 15 - 41 U/L _0 ALT 0 - 44 U/L _1 Alk Phosphatase 38 - 126 U/L 50  61  53   Total Bilirubin 0.3 - 1.2 mg/dL 0.7  0.6  0.7   Bilirubin, Direct 0.0 - 0.2 mg/dL   0.1      Past Medical History:  Diagnosis Date   Acid reflux    ocassional   Anemia    Cluster headache    Fibroid    uterine fibroids   Migraines     Past Surgical History:  Procedure  Laterality Date   ABDOMINAL HYSTERECTOMY N/A 12/24/2019   Procedure: HYSTERECTOMY ABDOMINAL;  Surgeon: Osborne Oman, MD;  Location: [place];  Service: Gynecology;  Laterality: N/A;   LAPAROSCOPY N/A 12/24/2019   Procedure: diagnostic laparoscopy;  Surgeon: Osborne Oman, MD;  Location: [place];  Service: Gynecology;  Laterality: N/A;   WISDOM TOOTH EXTRACTION      Current Medications, Allergies, Family History and Social History were reviewed in [employer] record.     Current Outpatient Medications  Medication Sig Dispense Refill   amoxicillin (AMOXIL) 500 MG capsule Take 1 capsule (500 mg total) by mouth in the morning, at noon, in the evening, and at bedtime for 14 days. 56 capsule 0   clarithromycin (BIAXIN) 500 MG tablet Take 1 tablet (500 mg total) by mouth 2 (two) times daily for 14 days. 28 tablet 0    metroNIDAZOLE (FLAGYL) 250 MG tablet Take 1 tablet (250 mg total) by mouth 4 (four) times daily for 14 days. 56 tablet 0   acetaminophen (TYLENOL) 500 MG tablet Take 2 tablets (1,000 mg total) by mouth every 6 (six) hours. (Patient taking differently: Take 1,000 mg by mouth every 8 (eight) hours as needed for mild pain or headache.) 30 tablet 2   ibuprofen (ADVIL) 600 MG tablet Take 1 tablet (600 mg total) by mouth every 8 (eight) hours as needed. 90 tablet 1   omeprazole (PRILOSEC) 40 MG capsule Take 1 capsule (40 mg total) by mouth daily. 30 minutes before breakfast 30 capsule 3   No current facility-administered medications for this visit.    Review of Systems: No chest pain. No shortness of breath. No urinary complaints.    Physical Exam  Wt Readings from Last 3 Encounters:  10/29/21 236 lb (107 kg)  10/06/21 234 lb 3.2 oz (106.2 kg)  09/21/21 234 lb (106.1 kg)    LMP 12/18/2019  Constitutional:  Generally well appearing ***female in no acute distress. Psychiatric: Pleasant. Normal mood and affect. Behavior is normal. EENT: Pupils normal.  Conjunctivae are normal. No scleral icterus. Neck supple.  Cardiovascular: Normal rate, regular rhythm. No edema Pulmonary/chest: Effort normal and breath sounds normal. No wheezing, rales or rhonchi. Abdominal: Soft, nondistended, nontender. Bowel sounds active throughout. There are no masses palpable. No hepatomegaly. Neurological: Alert and oriented to person place and time. Skin: Skin is warm and dry. No rashes noted.  Tye Savoy, NP  12/01/2021, 10:15 AM  Cc:  Kerin Perna, NP

## 2021-12-20 ENCOUNTER — Telehealth: Payer: Self-pay | Admitting: Gastroenterology

## 2021-12-20 NOTE — Telephone Encounter (Signed)
Patient called states  she wanted to follow up with Dr. Tarri Glenn and advise her she has finished the antibiotics treatment.

## 2021-12-20 NOTE — Telephone Encounter (Signed)
OV scheduled for 02/10/22 at 9:10 am with Dr. Tarri Glenn. Pt has bene advised to turn in stool kit prior to OV & remain off PPI for two weeks prior. Pt verbalized all understanding.

## 2021-12-20 NOTE — Telephone Encounter (Signed)
Left message for patient to call back  

## 2022-01-07 ENCOUNTER — Other Ambulatory Visit: Payer: Self-pay | Admitting: Gastroenterology

## 2022-02-10 ENCOUNTER — Ambulatory Visit: Payer: Medicaid Other | Admitting: Gastroenterology

## 2022-03-29 NOTE — Therapy (Incomplete)
OUTPATIENT PHYSICAL THERAPY THORACOLUMBAR EVALUATION   Patient Name: Shari Berger MRN: [id number] DOB:April 11, 1975, 47 y.o., female Today's Date: 03/30/2022  END OF SESSION:   Past Medical History:  Diagnosis Date   Acid reflux    ocassional   Anemia    Cluster headache    Fibroid    uterine fibroids   Migraines    Past Surgical History:  Procedure Laterality Date   ABDOMINAL HYSTERECTOMY N/A 12/24/2019   Procedure: HYSTERECTOMY ABDOMINAL;  Surgeon: Osborne Oman, MD;  Location: [place];  Service: Gynecology;  Laterality: N/A;   LAPAROSCOPY N/A 12/24/2019   Procedure: diagnostic laparoscopy;  Surgeon: Osborne Oman, MD;  Location: [place];  Service: Gynecology;  Laterality: N/A;   WISDOM TOOTH EXTRACTION     Patient Active Problem List   Diagnosis Date Noted   Abdominal pain, epigastric 09/21/2021   Nausea and vomiting 09/21/2021   Pneumoperitoneum of unknown etiology 02/06/2020   S/P TAH (total abdominal hysterectomy) 12/24/2019   Acute postoperative anemia due to expected blood loss 12/24/2019   Anemia due to chronic blood loss 12/19/2019   Fibroids 12/19/2019   Abnormal uterine bleeding (AUB) 02/01/2019   Tendonitis of foot 04/12/2017   Pelvic pain 04/12/2017   Migraines 04/11/2017   Acid reflux 04/11/2017    PCP: Kerin Perna, NP  REFERRING PROVIDER: Kary Kos, MD  REFERRING DIAG: M43.17 (ICD-10-CM) - Spondylolisthesis, lumbosacral region   Rationale for Evaluation and Treatment: Rehabilitation  THERAPY DIAG:  No diagnosis found.  ONSET DATE: ***  SUBJECTIVE:                                                                                                                                                                                           SUBJECTIVE STATEMENT: ***  PERTINENT HISTORY:  ***  PAIN:  Are you having pain?  Yes: NPRS scale: ***/10 Pain location: *** Pain description: *** Aggravating factors: *** Relieving factors:  ***  PRECAUTIONS: {Therapy precautions:24002}  WEIGHT BEARING RESTRICTIONS: {Yes ***/No:24003}  FALLS:  Has patient fallen in last 6 months? {fallsyesno:27318}  LIVING ENVIRONMENT: Lives with: {[facility] lives with:25569::"lives with their family"} Lives in: {Lives in:25570} Stairs: {opstairs:27293} Has following equipment at home: {Assistive devices:23999}  OCCUPATION: ***  PLOF: {PLOF:24004}  PATIENT GOALS: ***  NEXT MD VISIT:   OBJECTIVE:   DIAGNOSTIC FINDINGS:  ***  PATIENT SURVEYS:  Modified Oswestry ***   COGNITION: Overall cognitive status: {cognition:24006}     SENSATION: {sensation:27233}  MUSCLE LENGTH: Hamstrings: Right *** deg; Left *** deg Thomas test: Right *** deg; Left *** deg  POSTURE: {posture:25561}  PALPATION: ***  LUMBAR ROM:   AROM  eval  Flexion   Extension   Right lateral flexion   Left lateral flexion   Right rotation   Left rotation    (Blank rows = not tested)  LOWER EXTREMITY ROM:     {AROM/PROM:27142}  Right eval Left eval  Hip flexion    Hip extension    Hip abduction    Hip adduction    Hip internal rotation    Hip external rotation    Knee flexion    Knee extension    Ankle dorsiflexion    Ankle plantarflexion    Ankle inversion    Ankle eversion     (Blank rows = not tested)  LOWER EXTREMITY MMT:    MMT Right eval Left eval  Hip flexion    Hip extension    Hip abduction    Hip adduction    Hip internal rotation    Hip external rotation    Knee flexion    Knee extension    Ankle dorsiflexion    Ankle plantarflexion    Ankle inversion    Ankle eversion     (Blank rows = not tested)  LUMBAR SPECIAL TESTS:  {lumbar special test:25242}  FUNCTIONAL TESTS:  {Functional tests:24029}  GAIT: Distance walked: *** Assistive device utilized: {Assistive devices:23999} Level of assistance: {Levels of assistance:24026} Comments: ***  TREATMENT: [facility] Adult PT Treatment:                                                 DATE: 03/30/2021 Therapeutic Exercise: *** Manual Therapy: *** Neuromuscular re-ed: *** Therapeutic Activity: *** Modalities: *** Self Care: ***   PATIENT EDUCATION:  Education details: *** Person educated: {Person educated:25204} Education method: {Education Method:25205} Education comprehension: {Education Comprehension:25206}  HOME EXERCISE PROGRAM: ***  ASSESSMENT:  CLINICAL IMPRESSION: Patient is a *** y.o. *** who was seen today for physical therapy evaluation and treatment for ***.   OBJECTIVE IMPAIRMENTS: {opptimpairments:25111}.   ACTIVITY LIMITATIONS: {activitylimitations:27494}  PARTICIPATION LIMITATIONS: {participationrestrictions:25113}  PERSONAL FACTORS: {Personal factors:25162} are also affecting patient's functional outcome.   REHAB POTENTIAL: {rehabpotential:25112}  CLINICAL DECISION MAKING: {clinical decision making:25114}  EVALUATION COMPLEXITY: {Evaluation complexity:25115}   GOALS: Goals reviewed with patient? {yes/no:20286}  SHORT TERM GOALS: Target date: ***  Pt will be compliant and knowledgeable with initial HEP for improved comfort and carryover Baseline: initial HEP given  Goal status: INITIAL  2.  Pt will self report *** pain no greater than ***/10 for improved comfort and functional ability Baseline: ***/10 at worst Goal status: {GOALSTATUS:25110}   LONG TERM GOALS: Target date: ***  Pt will be decrease ODI disability score to no greater than ***% as proxy for functional improvement Baseline: ***% disability  Goal status: {GOALSTATUS:25110}  2.  Pt will self report *** pain no greater than ***/10 for improved comfort and functional ability Baseline: ***/10 at worst Goal status: {GOALSTATUS:25110}   3.  Pt will increase 30 Second Sit to Stand rep count to no less than *** reps for improved balance, strength, and functional mobility Baseline: *** reps  Goal status: {GOALSTATUS:25110}   4.  *** Baseline:   Goal status: {GOALSTATUS:25110}  5.  *** Baseline:  Goal status: {GOALSTATUS:25110}  PLAN:  PT FREQUENCY: {rehab frequency:25116}  PT DURATION: {rehab duration:25117}  PLANNED INTERVENTIONS: {rehab planned interventions:25118::"Therapeutic exercises","Therapeutic activity","Neuromuscular re-education","Balance training","Gait training","Patient/Family education","Self Care","Joint mobilization"}.  PLAN FOR NEXT SESSION: ***  Ward Chatters, PT 03/30/2022, 8:52 AM

## 2022-03-30 ENCOUNTER — Ambulatory Visit: Payer: Medicaid Other | Attending: Neurosurgery

## 2022-03-30 DIAGNOSIS — R269 Unspecified abnormalities of gait and mobility: Secondary | ICD-10-CM | POA: Insufficient documentation

## 2022-03-30 DIAGNOSIS — M545 Low back pain, unspecified: Secondary | ICD-10-CM | POA: Insufficient documentation

## 2022-04-12 ENCOUNTER — Ambulatory Visit: Payer: Self-pay | Admitting: Physical Therapy

## 2022-04-18 ENCOUNTER — Encounter: Payer: Self-pay | Admitting: Physical Therapy

## 2022-04-18 ENCOUNTER — Ambulatory Visit: Payer: Medicaid Other | Admitting: Physical Therapy

## 2022-04-18 VITALS — BP 124/85 | HR 92

## 2022-04-18 DIAGNOSIS — R269 Unspecified abnormalities of gait and mobility: Secondary | ICD-10-CM | POA: Diagnosis present

## 2022-04-18 DIAGNOSIS — M545 Low back pain, unspecified: Secondary | ICD-10-CM

## 2022-04-18 NOTE — Therapy (Signed)
OUTPATIENT PHYSICAL THERAPY THORACOLUMBAR EVALUATION   Patient Name: Shari Berger MRN: [id number] DOB:1975-10-26, 47 y.o., female Today's Date: 04/18/2022  END OF SESSION:  PT End of Session - 04/18/22 1004     Visit Number 1    Number of Visits 7    Date for PT Re-Evaluation 06/06/22    Authorization Type [employer]    PT Start Time 1007    PT Stop Time 1053    PT Time Calculation (min) 46 min    Equipment Utilized During Treatment Other (comment)   patient steady on level ground throughout session; advise use of gait belt with higher level balance tests   Activity Tolerance Patient tolerated treatment well    Behavior During Therapy WFL for tasks assessed/performed             Past Medical History:  Diagnosis Date   Acid reflux    ocassional   Anemia    Cluster headache    Fibroid    uterine fibroids   Migraines    Past Surgical History:  Procedure Laterality Date   ABDOMINAL HYSTERECTOMY N/A 12/24/2019   Procedure: HYSTERECTOMY ABDOMINAL;  Surgeon: Osborne Oman, MD;  Location: [place];  Service: Gynecology;  Laterality: N/A;   LAPAROSCOPY N/A 12/24/2019   Procedure: diagnostic laparoscopy;  Surgeon: Osborne Oman, MD;  Location: [place];  Service: Gynecology;  Laterality: N/A;   WISDOM TOOTH EXTRACTION     Patient Active Problem List   Diagnosis Date Noted   Abdominal pain, epigastric 09/21/2021   Nausea and vomiting 09/21/2021   Pneumoperitoneum of unknown etiology 02/06/2020   S/P TAH (total abdominal hysterectomy) 12/24/2019   Acute postoperative anemia due to expected blood loss 12/24/2019   Anemia due to chronic blood loss 12/19/2019   Fibroids 12/19/2019   Abnormal uterine bleeding (AUB) 02/01/2019   Tendonitis of foot 04/12/2017   Pelvic pain 04/12/2017   Migraines 04/11/2017   Acid reflux 04/11/2017    PCP: Kerin Perna, NP  REFERRING PROVIDER: Kary Kos, MD  REFERRING DIAG: Diagnosis M43.17 (ICD-10-CM) - Spondylolisthesis,  lumbosacral region  Rationale for Evaluation and Treatment: Rehabilitation  THERAPY DIAG:  Lumbar pain  Abnormality of gait and mobility  ONSET DATE: 03/15/2022 (Referral date)  SUBJECTIVE:                                                                                                                                                                                           SUBJECTIVE STATEMENT: Patient reports that she has "pressure to her lowerback and sometimes causes me to not be able to feel my leg." Patient reports that her  right leg is the one that experiences the numbness. She typically sleeps on her L side as her R hip is hurting. She reports symptoms over the last 2 years. She feels like since then it is "about the same or a little worse." Patient reports pain radiating down the right side. She states that sometimes she feels a "knot back there and I massage the knot." Patient reports challenges with walking/bending/sleeping. She reports that she currently starts to feel pain after walking about the distance of a parking lot. Patient reports between 4-5 hours of sleep as a result of the pain. Patient reports no history of back surgery. She reports that she was offered to get "shots" for her pain but she wanted to try physical therapy first.  Patient attended session with boyfriend.   PERTINENT HISTORY:  Per note on 03/15/2022:  "Patient reports that pain began a year ago.  She has a h/o back pain.  Pain is a 9/10.  It does radiate bilateral legs . Waking, standing , bending, climbing stairs trying to find a comfortable position to sleep  worsens pain.  Messages and tylenol improves pain.  Patient has been taking Tylenol '350mg'$  (2)  for pain with little relief.  Patient denies trauma or injury.  She denies dysuria, hematuria, fevers, chills, nausea, vomiting, abdominal pain, renal stones. Aggravating factors: certain movements and prolonged walking/standing. Alleviating factors: rest.  Progressive LE weakness or saddle anesthesia: none. Extremity sensation changes or weakness: none. Ambulatory without difficulty. Normal bowel/bladder habits: yes; without urinary retention. Normal PO intake without n/v. No associated abdominal pain/n/v. Self treatment: has OTC analgesics, with minimal relief. Patient denies: urinary retention/incontinence, bowel incontinence, weakness, falls, sensation changes or pain anywhere else. No h/o back surgeries. Patient has No headache, No chest pain, No abdominal pain - No Nausea, No new weakness tingling or numbness, No Cough - shortness of breath ."  PAIN:  Are you having pain? No Not currently - sitting down  Worst: 10/10 (sharp) - getting off the couch after sitting to long; responds well to massage Best: 0/10 Average: 10/10 when it hits  PRECAUTIONS: Fall  WEIGHT BEARING RESTRICTIONS: No  FALLS:  Has patient fallen in last 6 months? Yes. Number of falls slipped off porch when it was raining ; general soreness but no severe injuries; boyfriend helped her up  LIVING ENVIRONMENT: Lives with: lives with their family and boyfriend (and 48 year old son) Lives in: House/apartment Stairs: Yes: External: ~6 steps; on the left coming up but needs to be redone Has following equipment at home: None  OCCUPATION: Working - Ambulance person  PLOF: [place]: "Loosen up what is causing a problem."  OBJECTIVE:   Vitals:   04/18/22 1013  BP: 124/85  Pulse: 92    DIAGNOSTIC FINDINGS:   Per medical chart on 11/29/2021: "IMPRESSION: 1. Stable small central disc protrusion at L5-S1 without significant stenosis or neural impingement. 2. Otherwise essentially normal MRI of the lumbar spine."    PATIENT SURVEYS:  Modified Oswestry 26% Impairment   SCREENING FOR RED FLAGS: Bowel or bladder incontinence: No Spinal tumors: No Cauda equina syndrome: No Compression fracture: No Abdominal aneurysm: No  COGNITION: Overall  cognitive status: Within functional limits for tasks assessed     SENSATION: Hypersentivity in L heel; numbness in RLE with symptoms for a second or two with back symptoms; no saddle anaesthesia   EDEMA: none currently; mild swelling in bilateral LE when standing for too long  POSTURE: rounded  shoulders and forward head  PALPATION: CPAs - reports relief of symptoms and mild tenderness along L1-L3; increased sensitivity and pain with L4-S1  Reports relief with deep pressure/manual to right spinal extensors  LUMBAR ROM:   AROM eval  Flexion WFL - pain on way down 5/10 pain; no change with repeated motions  Extension WFL - pull that reduces w/repeated motions  Right lateral flexion WFL   Left lateral flexion WFL  Right rotation WFL  Left rotation WFL- pull 5/10 pain   (Blank rows = not tested)  LOWER EXTREMITY ROM:    Grossly WFL limits with the exception of AROM DF - mild reduction to ~5 degrees bilaterally; Hip IR: 30 degrees RLE  LOWER EXTREMITY MMT:    MMT Right eval Left eval  Hip flexion 4/5 4+/5  Hip extension 3/5 3/5  Hip abduction Not tolerated due to pain 4/5  Hip adduction 4/5 4/5  Hip internal rotation    Hip external rotation    Knee flexion 4-/5 4/5  Knee extension 4/5 4+/5  Ankle dorsiflexion 3/5 3/5  Ankle plantarflexion    Ankle inversion    Ankle eversion     (Blank rows = not tested)  LUMBAR SPECIAL TESTS:  Straight leg raise test: Negative and Ely's: negative FADIR: Positive on RLE  FUNCTIONAL TESTS:  5 times sit to stand: 27.34" and reports increase in LBP to 6/10 10 meter walk test: 1.00 m/s without AD and independent  GAIT: Distance walked: 100 feet Assistive device utilized: None Level of assistance: Complete Independence Comments: mild decreased heel contact; slight outoeing of feet bilaterally  TODAY'S TREATMENT:                                                                                                                                Not performed - eval only  PATIENT EDUCATION:  Education details: POC, collaboration of goals, examination results, follow up with PCP for slight elevation in diastolic BP Person educated: Patient Education method: Explanation Education comprehension: verbalized understanding  HOME EXERCISE PROGRAM: To be provided  ASSESSMENT:  CLINICAL IMPRESSION: Patient is a 47 y.o. female who was seen today for physical therapy evaluation and treatment for low back pain due to a small central disc protrusion on L5-S1 per imaging results in chart review. Patient found to have secondary symptoms consistent with hip impingement of RLE also contributing to deficits. Patient is at an increased risk for falls as indicated by FxSTS notably above 15 second cut off; patient responds well to extension bias reporting relief of symptoms with repeated motions of extension. Patient also presents with moderate ROM of R hip and bilateral DF, decreased strength R > L, impaired mobility, and posture. Patient will benefit from skilled physical therapy services to address these impairments and progress towards PLOF.   OBJECTIVE IMPAIRMENTS: Abnormal gait, decreased activity tolerance, decreased mobility, difficulty walking, decreased ROM, decreased strength, impaired sensation, postural dysfunction, and pain.  ACTIVITY LIMITATIONS: carrying, lifting, bending, sitting, standing, sleeping, and transfers  PARTICIPATION LIMITATIONS: cleaning, shopping, community activity, and occupation  PERSONAL FACTORS: Profession and Time since onset of injury/illness/exacerbation are also affecting patient's functional outcome.   REHAB POTENTIAL: Good  CLINICAL DECISION MAKING: Stable/uncomplicated  EVALUATION COMPLEXITY: Low   GOALS: Goals reviewed with patient? Yes  LONG TERM GOALS: Target date: 05/30/2022 (STG = LTG due to POC length)  Patient will report demonstrate independence with final HEP in order to maintain current  gains and continue to progress after physical therapy discharge.   Baseline: To be provided Goal status: INITIAL  2.  Patient will improve their 5x Sit to Stand score to less than 20 seconds with pain < 6/10 to demonstrate a decreased risk for falls and improved LE strength.   Baseline: 27.34" with pain 6/10 Goal status: INITIAL  3.  Patient will improve Modified Oswestry Disability Scale to < 20% to indicate improvement to low impairment related to low back pain and progression towards PLOF. Baseline: 26% Goal status: INITIAL  PLAN:  PT FREQUENCY: 1x/week  PT DURATION: 7 weeks  PLANNED INTERVENTIONS: Therapeutic exercises, Therapeutic activity, Neuromuscular re-education, Gait training, Patient/Family education, Self Care, and Manual therapy.  PLAN FOR NEXT SESSION: NEXT: initial HEP with lumbar, hip strengthening and mobility (calf stretch for DF); suggest walking program as tolerance for activity improves  Malachi Carl, PT, DPT 04/18/2022, 11:42 AM   Check all possible CPT codes: [phone] - PT Re-evaluation, 97110- Therapeutic Exercise, [phone]- Neuro Re-education, [phone] - Gait Training, [phone] - Manual Therapy, [phone] - Therapeutic Activities, and [phone] - Self Care    Check all conditions that are expected to impact treatment: Musculoskeletal disorders   If treatment provided at initial evaluation, no treatment charged due to lack of authorization.

## 2022-04-25 ENCOUNTER — Ambulatory Visit: Payer: Medicaid Other | Admitting: Physical Therapy

## 2022-04-25 ENCOUNTER — Telehealth: Payer: Self-pay | Admitting: Physical Therapy

## 2022-04-25 NOTE — Telephone Encounter (Signed)
Called patient and LVM about no show to PT appointment at 2:45pm on 04/25/2022. Reminded of 3-cancellation/no-show policy and reminded of upcoming appointment next Monday.  Malachi Carl, PT, DPT

## 2022-05-02 ENCOUNTER — Ambulatory Visit: Payer: Medicaid Other | Admitting: Physical Therapy

## 2022-05-02 ENCOUNTER — Telehealth: Payer: Self-pay | Admitting: Physical Therapy

## 2022-05-02 ENCOUNTER — Ambulatory Visit: Payer: Self-pay | Admitting: Physical Therapy

## 2022-05-02 ENCOUNTER — Encounter: Payer: Self-pay | Admitting: Physical Therapy

## 2022-05-02 DIAGNOSIS — M545 Low back pain, unspecified: Secondary | ICD-10-CM

## 2022-05-02 DIAGNOSIS — R269 Unspecified abnormalities of gait and mobility: Secondary | ICD-10-CM

## 2022-05-02 NOTE — Therapy (Signed)
[facility] [address] [place], [place], [address] Phone: [phone]   Fax:  [phone]  Patient Details  Name: Jenessa Gillingham MRN: [id number] Date of Birth: 1976-01-07 Referring Provider: Kary Kos, MD   Encounter Date: 05/02/2022  PHYSICAL THERAPY DISCHARGE SUMMARY  Visits from Start of Care: 1  Current functional level related to goals / functional outcomes: Unable to assess; patient did not return following eval   Remaining deficits: Unable to assess; patient did not return following eval   Education / Equipment: Left VM to patient recommending to call referring doctor for new referral if schedule allows for therapy in the future.   Patient agrees to discharge. Patient goals were not met. Patient is being discharged due to not returning since the last visit. Patient is being discharged after 2 no shows and 1 cancellation. Will need new referral for reassessment.   Esperanza Heir, PT, DPT 05/02/2022, 4:27 PM  [facility] [address] [place], [place], [address] Phone: [phone]   Fax:  [phone]

## 2022-05-02 NOTE — Telephone Encounter (Signed)
Called patient and LVM. She will be discharged from PT as she has reached max on 3-cancellation/no show policy. With 2 no shows and 1 cancellation. Provided call back number and recommended reaching out to doctor if she wishes for new referral to physical therapy in the future.   Malachi Carl, PT, DPT

## 2022-05-02 NOTE — Therapy (Unsigned)
[facility] [address] [place], [place], [address] Phone: [phone]   Fax:  [phone]  Patient Details  Name: Shari Berger MRN: [id number] Date of Birth: 1975/03/31 Referring Provider:  Kary Kos, MD  Encounter Date: 05/02/2022  PHYSICAL THERAPY DISCHARGE SUMMARY  Visits from Start of Care: 1  Current functional level related to goals / functional outcomes: Unable to assess; patient did not return after initial eval   Remaining deficits: Unable to assess; patient did not return after initial eval   Education / Equipment: Recommended calling PCP or referring provider for new referral if wishes to return to skilled PT services.   Patient goals were not met. Patient is being discharged due to not returning since the last visit. Patient is being discharged as she has had 1 cancellation, 1 no show since start of care. Will need a new referral in order to continue with therapy.  Esperanza Heir, PT, DPT 05/02/2022, 3:13 PM  [facility] [address] [place], [place], [address] Phone: [phone]   Fax:  [phone]

## 2022-05-03 NOTE — Therapy (Signed)
Note entered in error; see documentation note in chart for details.

## 2022-05-04 ENCOUNTER — Encounter: Payer: Self-pay | Admitting: Gastroenterology

## 2022-05-04 ENCOUNTER — Ambulatory Visit: Payer: Medicaid Other | Admitting: Gastroenterology

## 2022-05-04 ENCOUNTER — Ambulatory Visit (INDEPENDENT_AMBULATORY_CARE_PROVIDER_SITE_OTHER): Payer: Medicaid Other | Admitting: Gastroenterology

## 2022-05-04 VITALS — BP 122/72 | HR 82 | Ht 65.0 in | Wt 234.0 lb

## 2022-05-04 DIAGNOSIS — A048 Other specified bacterial intestinal infections: Secondary | ICD-10-CM | POA: Diagnosis not present

## 2022-05-04 DIAGNOSIS — R1013 Epigastric pain: Secondary | ICD-10-CM | POA: Diagnosis not present

## 2022-05-04 DIAGNOSIS — R112 Nausea with vomiting, unspecified: Secondary | ICD-10-CM

## 2022-05-04 NOTE — Progress Notes (Signed)
Referring Provider: Kerin Perna, NP Primary Care Physician:  Kerin Perna, NP  Reason for Consultation:  Abdominal pain   IMPRESSION:  Abdominal pain, nausea, vomiting, and history of H pylori gastritis. ? Persistent H pylori, reflux, NSAID-related PUD or gastritis.  Hiatal hernia on prior CT and EGD  Need for colon cancer screening  PLAN: - Diathirix H pylori stool antigen - Resume omeprazole 40 mg BID - Avoid NSAIDs - EGD - Screening colonoscopy - Low threshold for cross-sectional imaging - Office follow-up in 4-6 weeks, earlier if needed   HPI: Shari Berger is a 47 y.o. female who returns today with concerns for anorexia and reflux. She was last seen for EGD 10/29/21 for reflux, epigastric pain, nausea, and vomiting.  The interval history is obtained through the patient and review of her electronic health record.  EGD 10/29/2021 showed a small hiatal hernia and gastritis.  Biopsies confirmed H. pylori.  Esophageal and duodenal biopsies were normal.  She completed 2 weeks of treatment for H pylori and her symptoms improved. Has not yet submitted a stool for follow-up testing.   She has had several months of of anorexia, reflux, and abdominal pain up under her ribs. Symptoms are progressively worse. Not identical to those prior to H pylori diagnosis, but, similar.   Uses ibuprofen for cluster headaches PRN (monthly)  Recent Labs 07/13/21 showed normal lipase, normal liver enzymes and normal CBC  Recent abdominal imaging: - Renal protocol CT 02/10/21 showed a hiatal hernia and is otherwise normal    Past Medical History:  Diagnosis Date   Acid reflux    ocassional   Anemia    Cluster headache    Fibroid    uterine fibroids   Migraines     Past Surgical History:  Procedure Laterality Date   ABDOMINAL HYSTERECTOMY N/A 12/24/2019   Procedure: HYSTERECTOMY ABDOMINAL;  Surgeon: Osborne Oman, MD;  Location: [facility];  Service: Gynecology;  Laterality:  N/A;   LAPAROSCOPY N/A 12/24/2019   Procedure: diagnostic laparoscopy;  Surgeon: Osborne Oman, MD;  Location: [facility];  Service: Gynecology;  Laterality: N/A;   WISDOM TOOTH EXTRACTION       Current Outpatient Medications  Medication Sig Dispense Refill   omeprazole (PRILOSEC) 40 MG capsule TAKE 1 CAPSULE(40 MG) BY MOUTH DAILY 30 MINUTES BEFORE BREAKFAST 90 capsule 1   acetaminophen (TYLENOL) 500 MG tablet Take 2 tablets (1,000 mg total) by mouth every 6 (six) hours. (Patient not taking: Reported on 05/04/2022) 30 tablet 2   ibuprofen (ADVIL) 600 MG tablet Take 1 tablet (600 mg total) by mouth every 8 (eight) hours as needed. (Patient not taking: Reported on 05/04/2022) 90 tablet 1   No current facility-administered medications for this visit.    Allergies as of 05/04/2022   (No Known Allergies)    Family History  Problem Relation Age of Onset   Depression Mother    Heart disease Father    Diabetes Father    Hypertension Father    Colon cancer Neg Hx    Esophageal cancer Neg Hx       Physical Exam: Weight 234 pounds today Weight 234 pounds 09/21/21 General:   Alert,  well-nourished, pleasant and cooperative in NAD Head:  Normocephalic and atraumatic. Eyes:  Sclera clear, no icterus.   Conjunctiva pink. Abdomen:  Soft, nontender, nondistended, normal bowel sounds, no rebound or guarding. No hepatosplenomegaly.   Neurologic:  Alert and  oriented x4;  grossly nonfocal Skin:  Intact without  significant lesions or rashes. Psych:  Alert and cooperative. Normal mood and affect.    Eular Panek L. Tarri Glenn, MD, MPH 05/08/2022, 8:31 PM

## 2022-05-04 NOTE — Patient Instructions (Addendum)
Resume Omeprazole 40 mg twice daily 30-60 minutes prior to breakfast and dinner.   Complete stool sample and return to lab.   Follow up in 4-6 weeks.   Colonoscopy scheduled for 07/08/22 @ 8 am. Plan EGD if stool studies do not show recurrent H pylori.   _______________________________________________________  If your blood pressure at your visit was 140/90 or greater, please contact your primary care physician to follow up on this.  _______________________________________________________  If you are age 10 or older, your body mass index should be between 23-30. Your Body mass index is 38.94 kg/m. If this is out of the aforementioned range listed, please consider follow up with your Primary Care Provider.  If you are age 46 or younger, your body mass index should be between 19-25. Your Body mass index is 38.94 kg/m. If this is out of the aformentioned range listed, please consider follow up with your Primary Care Provider.   ________________________________________________________  The [facility] providers would like to encourage you to use [facility] to communicate with providers for non-urgent requests or questions.  Due to long hold times on the telephone, sending your provider a message by [facility] may be a faster and more efficient way to get a response.  Please allow 48 business hours for a response.  Please remember that this is for non-urgent requests.  _______________________________________________________

## 2022-05-08 ENCOUNTER — Encounter: Payer: Self-pay | Admitting: Gastroenterology

## 2022-05-09 ENCOUNTER — Ambulatory Visit: Payer: Medicaid Other | Admitting: Physical Therapy

## 2022-05-16 ENCOUNTER — Ambulatory Visit: Payer: Medicaid Other | Admitting: Physical Therapy

## 2022-05-23 ENCOUNTER — Ambulatory Visit: Payer: Medicaid Other | Admitting: Physical Therapy

## 2022-05-30 ENCOUNTER — Ambulatory Visit: Payer: Medicaid Other | Admitting: Physical Therapy

## 2022-06-07 ENCOUNTER — Emergency Department (HOSPITAL_BASED_OUTPATIENT_CLINIC_OR_DEPARTMENT_OTHER)
Admission: EM | Admit: 2022-06-07 | Discharge: 2022-06-07 | Disposition: A | Discharge status: Home / Self Care | Payer: Medicaid Other | Attending: Emergency Medicine | Admitting: Emergency Medicine

## 2022-06-07 ENCOUNTER — Other Ambulatory Visit (HOSPITAL_BASED_OUTPATIENT_CLINIC_OR_DEPARTMENT_OTHER): Payer: Self-pay

## 2022-06-07 ENCOUNTER — Encounter (HOSPITAL_BASED_OUTPATIENT_CLINIC_OR_DEPARTMENT_OTHER): Payer: Self-pay | Admitting: Emergency Medicine

## 2022-06-07 ENCOUNTER — Other Ambulatory Visit: Payer: Self-pay

## 2022-06-07 DIAGNOSIS — K219 Gastro-esophageal reflux disease without esophagitis: Secondary | ICD-10-CM

## 2022-06-07 DIAGNOSIS — R1013 Epigastric pain: Secondary | ICD-10-CM | POA: Diagnosis present

## 2022-06-07 LAB — COMPREHENSIVE METABOLIC PANEL
ALT: 16 U/L (ref 0–44)
AST: 6 U/L — ABNORMAL LOW (ref 15–41)
Albumin: 3.5 g/dL (ref 3.5–5.0)
Alkaline Phosphatase: 50 U/L (ref 38–126)
Anion gap: 8 (ref 5–15)
BUN: 16 mg/dL (ref 6–20)
CO2: 25 mmol/L (ref 22–32)
Calcium: 9.2 mg/dL (ref 8.9–10.3)
Chloride: 106 mmol/L (ref 98–111)
Creatinine, Ser: 0.58 mg/dL (ref 0.44–1.00)
GFR, Estimated: >60 mL/min (ref >60)
Glucose, Bld: 80 mg/dL (ref 70–99)
Potassium: 3.9 mmol/L (ref 3.5–5.1)
Sodium: 139 mmol/L (ref 135–145)
Total Bilirubin: 0.6 mg/dL (ref 0.3–1.2)
Total Protein: 7.3 g/dL (ref 6.5–8.1)

## 2022-06-07 LAB — CBC WITH DIFFERENTIAL/PLATELET
Abs Immature Granulocytes: 0.02 10*3/uL (ref 0.00–0.07)
Basophils Absolute: 0 10*3/uL (ref 0.0–0.1)
Basophils Relative: 0 %
Eosinophils Absolute: 0.3 10*3/uL (ref 0.0–0.5)
Eosinophils Relative: 4 %
HCT: 44.2 % (ref 36.0–46.0)
Hemoglobin: 14.8 g/dL (ref 12.0–15.0)
Immature Granulocytes: 0 %
Lymphocytes Relative: 41 %
Lymphs Abs: 3 10*3/uL (ref 0.7–4.0)
MCH: 28.5 pg (ref 26.0–34.0)
MCHC: 33.5 g/dL (ref 30.0–36.0)
MCV: 85.2 fL (ref 80.0–100.0)
Monocytes Absolute: 0.4 10*3/uL (ref 0.1–1.0)
Monocytes Relative: 5 %
Neutro Abs: 3.6 10*3/uL (ref 1.7–7.7)
Neutrophils Relative %: 50 %
Platelets: 289 10*3/uL (ref 150–400)
RBC: 5.19 MIL/uL — ABNORMAL HIGH (ref 3.87–5.11)
RDW: 13.2 % (ref 11.5–15.5)
WBC: 7.3 10*3/uL (ref 4.0–10.5)
nRBC: 0 % (ref 0.0–0.2)

## 2022-06-07 LAB — URINALYSIS, ROUTINE W REFLEX MICROSCOPIC
Bilirubin Urine: NEGATIVE
Glucose, UA: NEGATIVE mg/dL
Hgb urine dipstick: NEGATIVE
Leukocytes,Ua: NEGATIVE
Nitrite: NEGATIVE
Specific Gravity, Urine: 1.032 — ABNORMAL HIGH (ref 1.005–1.030)
pH: 5.5 (ref 5.0–8.0)

## 2022-06-07 LAB — PREGNANCY, URINE: Preg Test, Ur: NEGATIVE

## 2022-06-07 LAB — LIPASE, BLOOD: Lipase: 22 U/L (ref 11–51)

## 2022-06-07 MED ORDER — DICYCLOMINE HCL 20 MG PO TABS
20.0000 mg | ORAL_TABLET | Freq: Three times a day (TID) | ORAL | 0 refills | Status: DC
  Filled 2022-06-07: qty 20, 5d supply, fill #0

## 2022-06-07 MED ORDER — DICYCLOMINE HCL 10 MG PO CAPS
10.0000 mg | ORAL_CAPSULE | Freq: Once | ORAL | Status: AC
  Administered 2022-06-07: 10 mg via ORAL
  Filled 2022-06-07: qty 1

## 2022-06-07 MED ORDER — SUCRALFATE 1 G PO TABS
1.0000 g | ORAL_TABLET | Freq: Three times a day (TID) | ORAL | 0 refills | Status: DC
  Filled 2022-06-07: qty 90, 30d supply, fill #0

## 2022-06-07 MED ORDER — ALUM & MAG HYDROXIDE-SIMETH 200-200-20 MG/5ML PO SUSP
30.0000 mL | Freq: Once | ORAL | Status: AC
  Administered 2022-06-07: 30 mL via ORAL
  Filled 2022-06-07: qty 30

## 2022-06-07 NOTE — ED Triage Notes (Signed)
Pt reports sharp epigastric abdominal pain. Denies N/V. Pt reports this problem "has been going for a while." Pt reports seeing GI.

## 2022-06-07 NOTE — Discharge Instructions (Signed)
As we discussed, your symptoms are likely related to your GERD which you were diagnosed with by your GI doctor.  I recommend that you begin taking prescribed Prilosec twice a day as recommended by your GI doctor.  If this does not give you relief of symptoms, I have also prescribed you Carafate to add to your regimen or additional relief.  I have also given you a prescription for Bentyl to help with any additional symptoms.  Follow-up with your GI doctor at your scheduled appointment.  Follow-up with your PCP as well.  Return if development of any new or worsening symptoms.

## 2022-06-07 NOTE — ED Provider Notes (Signed)
[place] EMERGENCY DEPARTMENT AT [facility] Provider Note   CSN: [id number] Arrival date & time: 06/07/22  4098     History  Chief Complaint  Patient presents with   Abdominal Pain    Shari Berger is a 47 y.o. female.  Patient with history of acid reflux presents today with complaints of abdominal pain. She states that same has been intermittent in nature for the past several months, has been seen by GI for same and has been diagnosed with GERD. She states she is supposed to take Prilosec twice a day but has only been taking it once a day. States that yesterday she had chicken and honey for dinner and since then she has had epigastric abdominal pain consistent with her previous acid reflux pain but worse than normal and not resolving like it normally does. She has an appointment for GI follow-up on April 2. She denies fevers, chills, nausea, vomiting, diarrhea, hematuria, dysuria, or vaginal discharge. Patient with history of hysterectomy, no other history of abdominal surgeries. She is having regular bowel movements.   The history is provided by the patient. No language interpreter was used.  Abdominal Pain      Home Medications Prior to Admission medications   Medication Sig Start Date End Date Taking? Authorizing Provider  acetaminophen (TYLENOL) 500 MG tablet Take 2 tablets (1,000 mg total) by mouth every 6 (six) hours. Patient not taking: Reported on 05/04/2022 12/26/19   Tereso Newcomer, MD  ibuprofen (ADVIL) 600 MG tablet Take 1 tablet (600 mg total) by mouth every 8 (eight) hours as needed. Patient not taking: Reported on 05/04/2022 10/06/21   Grayce Sessions, NP  omeprazole (PRILOSEC) 40 MG capsule TAKE 1 CAPSULE(40 MG) BY MOUTH DAILY 30 MINUTES BEFORE BREAKFAST 01/07/22   Napoleon Form, MD      Allergies    Patient has no known allergies.    Review of Systems   Review of Systems  Gastrointestinal:  Positive for abdominal pain.  All other systems  reviewed and are negative.   Physical Exam Updated Vital Signs LMP 12/18/2019  Physical Exam Vitals and nursing note reviewed.  Constitutional:      General: She is not in acute distress.    Appearance: Normal appearance. She is normal weight. She is not ill-appearing, toxic-appearing or diaphoretic.  HENT:     Head: Normocephalic and atraumatic.  Cardiovascular:     Rate and Rhythm: Normal rate.  Pulmonary:     Effort: Pulmonary effort is normal. No respiratory distress.  Abdominal:     General: Abdomen is flat.     Palpations: Abdomen is soft.     Tenderness: There is abdominal tenderness in the epigastric area. There is no guarding or rebound.  Musculoskeletal:        General: Normal range of motion.     Cervical back: Normal range of motion.  Skin:    General: Skin is warm and dry.  Neurological:     General: No focal deficit present.     Mental Status: She is alert.  Psychiatric:        Mood and Affect: Mood normal.        Behavior: Behavior normal.    ED Results / Procedures / Treatments   Labs (all labs ordered are listed, but only abnormal results are displayed) Labs Reviewed  LIPASE, BLOOD  COMPREHENSIVE METABOLIC PANEL  CBC  PREGNANCY, URINE    EKG None  Radiology No results found.  Procedures Procedures  Medications Ordered in ED Medications  alum & mag hydroxide-simeth (MAALOX/MYLANTA) 200-200-20 MG/5ML suspension 30 mL (has no administration in time range)  dicyclomine (BENTYL) capsule 10 mg (has no administration in time range)    ED Course/ Medical Decision Making/ A&P                             Medical Decision Making Amount and/or Complexity of Data Reviewed Labs: ordered.  Risk OTC drugs. Prescription drug management.   This patient is a 47 y.o. female who presents to the ED for concern of abdominal pain, this involves an extensive number of treatment options, and is a complaint that carries with it a high risk of  complications and morbidity. The emergent differential diagnosis prior to evaluation includes, but is not limited to,  gastroenteritis, appendicitis, Bowel obstruction, Bowel perforation. Gastroparesis, DKA, Hernia, Inflammatory bowel disease, pancreatitis, volvulus.  This is not an exhaustive differential.   Past Medical History / Co-morbidities / Social History: Hx GERD  Additional history: Chart reviewed. Pertinent results include: seen by Dr. Orvan Falconer with [facility], had EGD, showed hiatal hernia, GERD, and H. Pylori.  Completed treatment regimen for H. pylori, has follow-up appointment on April 2  Physical Exam: Physical exam performed. The pertinent findings include: mild epigastric tenderness to palpation without rebound or guarding  Lab Tests: I ordered, and personally interpreted labs.  The pertinent results include: No leukocytosis or anemia, UA with trace ketones and protein, noninfectious.   Cardiac Monitoring:  The patient was maintained on a cardiac monitor which showed an underlying rhythm of: sinus rhythm, no STEMI. I agree with this interpretation.   Medications: I ordered medication including GI cocktail, Bentyl for pain. Reevaluation of the patient after these medicines showed that the patient resolved. I have reviewed the patients home medicines and have made adjustments as needed.   Disposition:  Patient presents today with epigastric abdominal pain since last night.  She is afebrile, nontoxic-appearing, and in no acute distress with reassuring vital signs.  Pain is consistent with her pain that she has had intermittently for the past year and has seen GI and been diagnosed with GERD.  She has a follow-up appointment on April 2.  Patient is nontoxic, nonseptic appearing, in no apparent distress.  Patient's pain and other symptoms adequately managed in emergency department. Labs and vitals reviewed.  Patient does not meet the SIRS or Sepsis criteria.  On repeat exam  patient does not have a surgical abdomin and there are no peritoneal signs.  No indication of appendicitis, bowel obstruction, bowel perforation, cholecystitis, diverticulitis, PID or ectopic pregnancy.  Considered CT imaging, however given that patient's symptoms are consistent with previous that has been diagnosed as GERD and her symptoms have been completely resolved after GI cocktail and Bentyl, shared decision making and patient would prefer to defer imaging at this time.  Patient discharged home with recommendations to begin taking her Prilosec twice a day as was prescribed by her GI doctor.  Will also send for Carafate for additional symptomatic relief as well as Bentyl which seemed to help her today.  Patient given strict instructions for follow-up with her GI doctor at her appointment on April 2 of this will as her primary care physician.  I have also discussed reasons to return immediately to the ER.  Patient expresses understanding and agrees with plan.  Patient discharged in stable condition.   Final Clinical Impression(s) / ED Diagnoses Final  diagnoses:  Gastroesophageal reflux disease, unspecified whether esophagitis present    Rx / DC Orders ED Discharge Orders          Ordered    sucralfate (CARAFATE) 1 g tablet  3 times daily before meals        06/07/22 1347    dicyclomine (BENTYL) 20 MG tablet  3 times daily before meals & bedtime        06/07/22 1347          An After Visit Summary was printed and given to the patient.     Vear Clock 06/07/22 1349    Margarita Grizzle, MD 06/08/22 [phone]

## 2022-06-08 ENCOUNTER — Encounter (INDEPENDENT_AMBULATORY_CARE_PROVIDER_SITE_OTHER): Payer: Self-pay

## 2022-06-14 ENCOUNTER — Other Ambulatory Visit (HOSPITAL_BASED_OUTPATIENT_CLINIC_OR_DEPARTMENT_OTHER): Payer: Self-pay

## 2022-06-28 ENCOUNTER — Encounter: Payer: Self-pay | Admitting: Gastroenterology

## 2022-06-28 ENCOUNTER — Ambulatory Visit (INDEPENDENT_AMBULATORY_CARE_PROVIDER_SITE_OTHER): Payer: Medicaid Other | Admitting: Gastroenterology

## 2022-06-28 VITALS — BP 106/80 | HR 111 | Ht 65.0 in | Wt 230.0 lb

## 2022-06-28 DIAGNOSIS — R112 Nausea with vomiting, unspecified: Secondary | ICD-10-CM | POA: Diagnosis not present

## 2022-06-28 DIAGNOSIS — R1013 Epigastric pain: Secondary | ICD-10-CM

## 2022-06-28 NOTE — Patient Instructions (Signed)
You have been scheduled for a colonoscopy and EGD. Please follow written instructions given to you at your visit today.  Please pick up your prep supplies at the pharmacy within the next 1-3 days. If you use inhalers (even only as needed), please bring them with you on the day of your procedure.  Please follow up in 4-6 weeks   You have been scheduled for a CT scan of the abdomen and pelvis at [facility], 1st floor Radiology. You are scheduled on 07/19/2022 at 4:30pm. You should arrive a 2 hours prior to your appointment time for registration.   Please follow the written instructions below on the day of your exam:   1) Do not eat anything 4 hours prior to your test)   You may take any medications as prescribed with a small amount of water, if necessary. If you take any of the following medications: METFORMIN, GLUCOPHAGE, [place], AVANDAMET, RIOMET, FORTAMET, [place] MET, JANUMET, GLUMETZA or METAGLIP, you MAY be asked to HOLD this medication 48 hours AFTER the exam.   If you have any questions regarding your exam or if you need to reschedule, you may call [facility] at [phone] between the hours of 8:00 am and 5:00 pm, Monday-Friday.

## 2022-06-28 NOTE — Progress Notes (Signed)
Referring Provider: Kerin Perna, NP Primary Care Physician:  Kerin Perna, NP  Reason for Consultation:  Abdominal pain   IMPRESSION:  Abdominal pain, nausea, vomiting, and history of H pylori gastritis.  Severity of symptoms is escalating such that she required recent ED evaluation.  Recommend contrasted cross-sectional imaging and endoscopic evaluation.  The differential is broad.  It includes reflux, NSAID-related PUD, gastritis, gastroparesis.  Functional etiologies and even malignancy must also be considered.  Hiatal hernia on prior CT and prior EGD  History of H. pylori.  Recent Diatherix stool test documenting eradication.  Need for colon cancer screening.  Colonoscopy recommended.  PLAN: - Continue omeprazole 40 mg BID - Add Bentyl 20 mg QID PRN - Avoid NSAIDs - CT abd/pelvis with contrast to evaluate for abdominal pain - EGD - Screening colonoscopy - Office follow-up in 4-6 weeks, earlier if needed   HPI: Shari Berger is a 47 y.o. female who returns today with concerns for abdominal pain. She was last seen 05/04/22. Endoscopic evaluation of her symptoms was planned, but, she was recently seen in the ED for escalating abdominal pain.   She was last seen for EGD 10/29/21 for reflux, epigastric pain, nausea, and vomiting.  The interval history is obtained through the patient and review of her electronic health record.  She completed 2 weeks of treatment for H pylori and her symptoms improved. Recent Diatherix testing was negative for H pylori.   She has had several months of of anorexia, reflux, and abdominal pain up under her ribs. Symptoms are progressively worse. Not identical to those prior to H pylori diagnosis, but, similar.   Uses ibuprofen for cluster headaches PRN (monthly)  On her recent trip to the ER she felt significant relief in her symptoms with Bentyl.  Labs at that time show a normal comprehensive metabolic panel, normal CBC, and normal  lipase.  Pregnancy test was negative.  Recent abdominal imaging: - Renal protocol CT 02/10/21 showed a hiatal hernia and is otherwise normal  Endoscopic history: - EGD 10/29/2021 showed a small hiatal hernia and gastritis.  Biopsies confirmed H. pylori.  Esophageal and duodenal biopsies were normal.   Past Medical History:  Diagnosis Date   Acid reflux    ocassional   Anemia    Cluster headache    Fibroid    uterine fibroids   Migraines     Past Surgical History:  Procedure Laterality Date   ABDOMINAL HYSTERECTOMY N/A 12/24/2019   Procedure: HYSTERECTOMY ABDOMINAL;  Surgeon: Osborne Oman, MD;  Location: [place];  Service: Gynecology;  Laterality: N/A;   LAPAROSCOPY N/A 12/24/2019   Procedure: diagnostic laparoscopy;  Surgeon: Osborne Oman, MD;  Location: [place];  Service: Gynecology;  Laterality: N/A;   WISDOM TOOTH EXTRACTION       Current Outpatient Medications  Medication Sig Dispense Refill   acetaminophen (TYLENOL) 500 MG tablet Take 2 tablets (1,000 mg total) by mouth every 6 (six) hours. 30 tablet 2   dicyclomine (BENTYL) 20 MG tablet Take 1 tablet (20 mg total) by mouth 4 (four) times daily -  before meals and at bedtime. 20 tablet 0   ibuprofen (ADVIL) 600 MG tablet Take 1 tablet (600 mg total) by mouth every 8 (eight) hours as needed. 90 tablet 1   omeprazole (PRILOSEC) 40 MG capsule TAKE 1 CAPSULE(40 MG) BY MOUTH DAILY 30 MINUTES BEFORE BREAKFAST 90 capsule 1   No current facility-administered medications for this visit.    Allergies as  of 06/28/2022   (No Known Allergies)    Family History  Problem Relation Age of Onset   Depression Mother    Heart disease Father    Diabetes Father    Hypertension Father    Colon cancer Neg Hx    Esophageal cancer Neg Hx       Physical Exam: Weight 234 pounds today Weight 234 pounds 09/21/21 General:   Alert,  well-nourished, pleasant and cooperative in NAD Head:  Normocephalic and atraumatic. Eyes:  Sclera  clear, no icterus.   Conjunctiva pink. Abdomen:  Soft, nontender, nondistended, normal bowel sounds, no rebound or guarding. No hepatosplenomegaly.   Neurologic:  Alert and  oriented x4;  grossly nonfocal Skin:  Intact without significant lesions or rashes. Psych:  Alert and cooperative. Normal mood and affect.    Timisha Mondry L. Tarri Glenn, MD, MPH 06/28/2022, 4:44 PM

## 2022-07-01 ENCOUNTER — Telehealth: Payer: Self-pay | Admitting: Gastroenterology

## 2022-07-01 MED ORDER — NA SULFATE-K SULFATE-MG SULF 17.5-3.13-1.6 GM/177ML PO SOLN: 1.0000 | Freq: Once | ORAL | 0 refills | Status: AC

## 2022-07-01 NOTE — Telephone Encounter (Signed)
Inbound call from patient requesting to sent the kit prep for up coming procedure on 4/12.Marland KitchenPlease advise

## 2022-07-01 NOTE — Telephone Encounter (Signed)
Prep sent to pharmacy

## 2022-07-02 ENCOUNTER — Other Ambulatory Visit: Payer: Self-pay

## 2022-07-02 ENCOUNTER — Encounter (HOSPITAL_BASED_OUTPATIENT_CLINIC_OR_DEPARTMENT_OTHER): Payer: Self-pay | Admitting: Emergency Medicine

## 2022-07-02 ENCOUNTER — Emergency Department (HOSPITAL_BASED_OUTPATIENT_CLINIC_OR_DEPARTMENT_OTHER)
Admission: EM | Admit: 2022-07-02 | Discharge: 2022-07-02 | Disposition: A | Discharge status: Home / Self Care | Payer: Medicaid Other | Attending: Emergency Medicine | Admitting: Emergency Medicine

## 2022-07-02 ENCOUNTER — Emergency Department (HOSPITAL_BASED_OUTPATIENT_CLINIC_OR_DEPARTMENT_OTHER): Payer: Medicaid Other

## 2022-07-02 DIAGNOSIS — R1907 Generalized intra-abdominal and pelvic swelling, mass and lump: Secondary | ICD-10-CM

## 2022-07-02 DIAGNOSIS — R1031 Right lower quadrant pain: Secondary | ICD-10-CM | POA: Diagnosis present

## 2022-07-02 DIAGNOSIS — R1012 Left upper quadrant pain: Secondary | ICD-10-CM | POA: Diagnosis not present

## 2022-07-02 DIAGNOSIS — R1032 Left lower quadrant pain: Secondary | ICD-10-CM | POA: Insufficient documentation

## 2022-07-02 LAB — COMPREHENSIVE METABOLIC PANEL
ALT: 12 U/L (ref 0–44)
AST: 7 U/L — ABNORMAL LOW (ref 15–41)
Albumin: 3.5 g/dL (ref 3.5–5.0)
Alkaline Phosphatase: 55 U/L (ref 38–126)
Anion gap: 7 (ref 5–15)
BUN: 11 mg/dL (ref 6–20)
CO2: 24 mmol/L (ref 22–32)
Calcium: 8.9 mg/dL (ref 8.9–10.3)
Chloride: 107 mmol/L (ref 98–111)
Creatinine, Ser: 0.56 mg/dL (ref 0.44–1.00)
GFR, Estimated: >60 mL/min (ref >60)
Glucose, Bld: 96 mg/dL (ref 70–99)
Potassium: 4 mmol/L (ref 3.5–5.1)
Sodium: 138 mmol/L (ref 135–145)
Total Bilirubin: 0.6 mg/dL (ref 0.3–1.2)
Total Protein: 6.7 g/dL (ref 6.5–8.1)

## 2022-07-02 LAB — CBC
HCT: 43.5 % (ref 36.0–46.0)
Hemoglobin: 14.6 g/dL (ref 12.0–15.0)
MCH: 28.6 pg (ref 26.0–34.0)
MCHC: 33.6 g/dL (ref 30.0–36.0)
MCV: 85.3 fL (ref 80.0–100.0)
Platelets: 292 10*3/uL (ref 150–400)
RBC: 5.1 MIL/uL (ref 3.87–5.11)
RDW: 13.1 % (ref 11.5–15.5)
WBC: 7 10*3/uL (ref 4.0–10.5)
nRBC: 0 % (ref 0.0–0.2)

## 2022-07-02 LAB — URINALYSIS, ROUTINE W REFLEX MICROSCOPIC
Bilirubin Urine: NEGATIVE
Glucose, UA: NEGATIVE mg/dL
Hgb urine dipstick: NEGATIVE
Ketones, ur: NEGATIVE mg/dL
Leukocytes,Ua: NEGATIVE
Nitrite: NEGATIVE
Protein, ur: NEGATIVE mg/dL
Specific Gravity, Urine: 1.026 (ref 1.005–1.030)
pH: 6.5 (ref 5.0–8.0)

## 2022-07-02 LAB — LIPASE, BLOOD: Lipase: 20 U/L (ref 11–51)

## 2022-07-02 MED ORDER — ONDANSETRON HCL 4 MG/2ML IJ SOLN
4.0000 mg | Freq: Once | INTRAMUSCULAR | Status: AC
  Administered 2022-07-02: 4 mg via INTRAVENOUS
  Filled 2022-07-02: qty 2

## 2022-07-02 MED ORDER — ONDANSETRON HCL 4 MG PO TABS: 4.0000 mg | ORAL_TABLET | Freq: Four times a day (QID) | ORAL | 0 refills | Status: DC

## 2022-07-02 MED ORDER — MORPHINE SULFATE (PF) 4 MG/ML IV SOLN
4.0000 mg | Freq: Once | INTRAVENOUS | Status: AC
  Administered 2022-07-02: 4 mg via INTRAVENOUS
  Filled 2022-07-02: qty 1

## 2022-07-02 MED ORDER — SENNOSIDES-DOCUSATE SODIUM 8.6-50 MG PO TABS: 1.0000 | ORAL_TABLET | Freq: Every day | ORAL | 0 refills | Status: DC

## 2022-07-02 MED ORDER — OXYCODONE HCL 5 MG PO TABS: 5.0000 mg | ORAL_TABLET | Freq: Four times a day (QID) | ORAL | 0 refills | Status: DC | PRN

## 2022-07-02 MED ORDER — SODIUM CHLORIDE 0.9 % IV BOLUS
1000.0000 mL | Freq: Once | INTRAVENOUS | Status: AC
  Administered 2022-07-02: 1000 mL via INTRAVENOUS

## 2022-07-02 MED ORDER — IOHEXOL 300 MG/ML  SOLN
100.0000 mL | Freq: Once | INTRAMUSCULAR | Status: AC | PRN
  Administered 2022-07-02: 80 mL via INTRAVENOUS

## 2022-07-02 MED ORDER — OXYCODONE-ACETAMINOPHEN 5-325 MG PO TABS
1.0000 | ORAL_TABLET | Freq: Once | ORAL | Status: AC
  Administered 2022-07-02: 1 via ORAL
  Filled 2022-07-02: qty 1

## 2022-07-02 NOTE — ED Provider Notes (Signed)
[place] EMERGENCY DEPARTMENT AT [facility] Provider Note   CSN: [id number] Arrival date & time: 07/02/22  1464     History  Chief Complaint  Patient presents with   Abdominal Pain    Kathyann Ixta is a 47 y.o. female.  Patient is a 48 year old female with past medical history of prior hysterectomy secondary to fibroids, migraines, anemia.  Patient presenting today with complaints of abdominal pain.  This started 2 days ago and was localized to the right lower quadrant.  Pain is now moved to the left side.  She denies any fevers or chills.  She denies any vomiting or diarrhea.  No urinary complaints.  Pain worse with palpation and movement with no alleviating factors.  She was seen by her primary doctor 4 days ago and was scheduled for a CT scan, however this has not happened yet.  The history is provided by the patient.       Home Medications Prior to Admission medications   Medication Sig Start Date End Date Taking? Authorizing Provider  acetaminophen (TYLENOL) 500 MG tablet Take 2 tablets (1,000 mg total) by mouth every 6 (six) hours. 12/26/19   Anyanwu, Jethro Bastos, MD  dicyclomine (BENTYL) 20 MG tablet Take 1 tablet (20 mg total) by mouth 4 (four) times daily -  before meals and at bedtime. 06/07/22   Smoot, Shawn Route, PA-C  ibuprofen (ADVIL) 600 MG tablet Take 1 tablet (600 mg total) by mouth every 8 (eight) hours as needed. 10/06/21   Grayce Sessions, NP  omeprazole (PRILOSEC) 40 MG capsule TAKE 1 CAPSULE(40 MG) BY MOUTH DAILY 30 MINUTES BEFORE BREAKFAST 01/07/22   Napoleon Form, MD      Allergies    Patient has no known allergies.    Review of Systems   Review of Systems  All other systems reviewed and are negative.   Physical Exam Updated Vital Signs BP (!) 151/90 (BP Location: Right Arm)   Pulse 83   Temp 98.5 F (36.9 C) (Oral)   Resp 18   Wt 104.3 kg   LMP 12/18/2019   SpO2 99%   BMI 38.27 kg/m  Physical Exam Vitals and nursing note  reviewed.  Constitutional:      General: She is not in acute distress.    Appearance: She is well-developed. She is not diaphoretic.  HENT:     Head: Normocephalic and atraumatic.  Cardiovascular:     Rate and Rhythm: Normal rate and regular rhythm.     Heart sounds: No murmur heard.    No friction rub. No gallop.  Pulmonary:     Effort: Pulmonary effort is normal. No respiratory distress.     Breath sounds: Normal breath sounds. No wheezing.  Abdominal:     General: Bowel sounds are normal. There is no distension.     Palpations: Abdomen is soft.     Tenderness: There is abdominal tenderness in the left upper quadrant and left lower quadrant. There is no right CVA tenderness, left CVA tenderness, guarding or rebound.  Musculoskeletal:        General: Normal range of motion.     Cervical back: Normal range of motion and neck supple.  Skin:    General: Skin is warm and dry.  Neurological:     General: No focal deficit present.     Mental Status: She is alert and oriented to person, place, and time.     ED Results / Procedures / Treatments   Labs (  all labs ordered are listed, but only abnormal results are displayed) Labs Reviewed  CBC  LIPASE, BLOOD  COMPREHENSIVE METABOLIC PANEL  URINALYSIS, ROUTINE W REFLEX MICROSCOPIC    EKG None  Radiology No results found.  Procedures Procedures    Medications Ordered in ED Medications  sodium chloride 0.9 % bolus 1,000 mL (has no administration in time range)  ondansetron (ZOFRAN) injection 4 mg (has no administration in time range)  morphine (PF) 4 MG/ML injection 4 mg (has no administration in time range)    ED Course/ Medical Decision Making/ A&P  Patient is a 47 year old female presenting with complaints of abdominal pain.  This started in her right side 2 days ago, but has now settled on her left side.  She is tender to palpation in the left upper and left lower quadrant.  Vitals are otherwise stable and she is  afebrile.  Workup initiated including CBC, CMP, and lipase.  Urinalysis also ordered, but not collected.  Care to be signed out to oncoming provider at shift change with labs and CT pending.  IV fluids and pain and nausea medications have been ordered.  Final Clinical Impression(s) / ED Diagnoses Final diagnoses:  None    Rx / DC Orders ED Discharge Orders     None         Geoffery Lyons, MD 07/02/22 [phone]

## 2022-07-02 NOTE — ED Notes (Signed)
Dc instructions reviewed with patient. Patient voiced understanding. Dc with belongings.  °

## 2022-07-02 NOTE — ED Provider Notes (Signed)
Patient signed out to me at 07 100 by Dr. Judd Lien pending CT AP and reassessment.  In short this is a 47 year old female with a past medical history of a hysterectomy presenting to the emergency department with abdominal pain.  On patient's arrival to the emergency department she was hemodynamically stable with tenderness in the left side of the abdomen.  The patient had labs performed that showed no significant abnormalities including a normal urine.  The patient's given morphine and Zofran for symptomatic control and at the time of sign out the patient is at CT.  Clinical Course as of 07/02/22 0809  Sat Jul 02, 2022  7510 CT imaging reviewed by myself and agree with radiology read of large abdominal mass, unclear etiology without evidence of infection or metastasis. [VK]  0802 Upon reassessment, patient's pain has improved but not completely resolved. She has had no vomiting and can tolerate PO. She is stable for discharge home and will be given oncology referral for further diagnostic testing and recommended to follow up with her GI as well. She was given strict return precautions. [VK]    Clinical Course User Index [VK] Rexford Maus, DO      Rexford Maus, [place] 07/02/22 [phone]

## 2022-07-02 NOTE — ED Triage Notes (Signed)
Abd pain that started 2 days ago on RLQ and has shifted to entire L side, cramping   Denies fever, N/V/D, blood in stool, back pain, urinary sx, decreased appetite  H/o GERD  Hysterectomy

## 2022-07-02 NOTE — Discharge Instructions (Addendum)
You were seen in the emergency department for your abdominal pain.  Your CT scan showed that you have a mass in your abdomen that is about 19 cm large in the middle of your abdomen.  It is unclear if it is coming from your bowels, your abdominal muscles or from the mesentery which is a layer of fat the covers your bowels in your abdomen.  There is no signs that this mass is infected and it is not blocking your bowels at this time.  I have given you follow-up with oncology for further diagnostic testing and you can follow-up with your primary doctor and GI doctor as well.  You can take Tylenol and Motrin as needed for pain and both can be taken every 6 hours and I have given you oxycodone for breakthrough pain.  This can make you drowsy so do not take it before driving, operating heavy machinery or working.  It can also make you constipated so I recommend taking a stool softener with that.  I have also given you Zofran to take as needed for nausea.  You should return to the emergency department for significantly worsening pain, repetitive vomiting despite the nausea medication, fevers or if you have any other new or concerning symptoms.

## 2022-07-04 ENCOUNTER — Telehealth (INDEPENDENT_AMBULATORY_CARE_PROVIDER_SITE_OTHER): Payer: Self-pay | Admitting: Primary Care

## 2022-07-04 ENCOUNTER — Telehealth: Payer: Self-pay | Admitting: Gastroenterology

## 2022-07-04 NOTE — Telephone Encounter (Signed)
Inbound call from patient requesting to speak with a nurse , she was on ED on 4/6 and she said they told her why she was having so much pain and she want to go over that .Please advise

## 2022-07-04 NOTE — Telephone Encounter (Signed)
Pt is calling in requesting to speak with Shari Berger or her nurse regarding medication. Pt says she was seen in the ER and they prescribed her Oxycodone for pain and pt believes she is allergic to it. Pt says when she takes the medication she gets very itchy and her GI doctor said it could be an allergic reaction but they don't prescribe medication so they couldn't give her anything. Pt wants to know if she can take something else. Pt was not having a reaction at the time of the call. Please advise.

## 2022-07-04 NOTE — Telephone Encounter (Signed)
Left message on machine to call back  

## 2022-07-04 NOTE — Telephone Encounter (Signed)
Dr Orvan Falconer this pt was seen by you on 4/2 for abd pain and was set up for endo colon and CT.  However, her pain became so severe that she was seen in the ED and had the CT completed. A mass was seen in the abd.  Oncology referral was made per the ED note.  She still has the CT scheduled for 4/23 and endo colon on 4/12.  Is it ok to cancel the CT?  I have advised the pt that she should plan to keep the endo colon as planned on Friday.  She also states that she was given hydrocodone for the pain but has developed an intense itch over her whole body since starting.  She would like something else for the pain.  She is calling her PCP today as well.  I have advised that you are out of the office today and will be contacted tomorrow.

## 2022-07-04 NOTE — Telephone Encounter (Signed)
Routing to PCP for review.

## 2022-07-05 NOTE — Telephone Encounter (Signed)
CT has been cancelled will await further instructions from Dr Orvan Falconer

## 2022-07-05 NOTE — Telephone Encounter (Signed)
Do you want me to wait before I cancel the procedures until you speak with Dr Donell Beers or Freida Busman?

## 2022-07-05 NOTE — Telephone Encounter (Signed)
Pt is wanting to have a different pain medication

## 2022-07-06 ENCOUNTER — Telehealth: Payer: Self-pay

## 2022-07-06 DIAGNOSIS — R1900 Intra-abdominal and pelvic swelling, mass and lump, unspecified site: Secondary | ICD-10-CM

## 2022-07-06 NOTE — Telephone Encounter (Signed)
-----   Message from Tressia Danas, MD sent at 07/06/2022 10:12 AM EDT ----- Regarding: RE: Referral to surgeon Discussed with the patient by phone. Please arrange for percutaneous biopsy with IR ASAP.   Please cancel the EGD and colonoscopy scheduled for this week.   Thank you.  KLB ----- Message ----- From: Fritzi Mandes, MD Sent: 07/06/2022   9:40 AM EDT To: Almond Lint, MD; Jearld Lesch; # Subject: RE: Referral to surgeon                        I reviewed her imaging, that is a very large mass. It should be easily amenable to percutaneous biopsy, I would biopsy it first to get a tissue diagnosis and see if neoadjuvant treatment would be a possibility. I can see her though.  Vernona Rieger and Adelina Mings, can you please schedule this patient in the first available new cancer slot with either me or Byer[place]? Thanks, SA  ----- Message ----- From: Tressia Danas, MD Sent: 07/05/2022  12:58 PM EDT To: Almond Lint, MD; Fritzi Mandes, MD Subject: FW: Referral to surgeon                        Is either one of you available to see this patient with a large central abdominal peritoneal mass? She has an EGD and colonoscopy scheduled for Friday, 07/08/22. She may still need to have the procedures, but, I am concerned about some increased risk for perforation in the setting. Was hoping that I could get your input before then.   Thanks for your help.  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    I will work on getting her scheduled here as well but he thought first order would be surgeon consult   Thank you  Laurena Spies Drawbridge Nurse  Navigator [phone]

## 2022-07-06 NOTE — Telephone Encounter (Signed)
Procedures have been cancelled  Order for CT biopsy has been entered and sent to the schedulers.  I have called IR and Tiffany advised to put the order in as CT biopsy and send to the central schedulers.

## 2022-07-07 NOTE — Progress Notes (Unsigned)
Shari Dance, MD  Shari Berger for [place] core bx gigantic central abd mass  See recent CT a/p  TS

## 2022-07-08 ENCOUNTER — Encounter: Payer: Medicaid Other | Admitting: Gastroenterology

## 2022-07-08 ENCOUNTER — Encounter (HOSPITAL_COMMUNITY): Payer: Self-pay

## 2022-07-08 ENCOUNTER — Ambulatory Visit (HOSPITAL_COMMUNITY)
Admission: RE | Admit: 2022-07-08 | Discharge: 2022-07-08 | Disposition: A | Discharge status: Home / Self Care | Payer: Medicaid Other | Source: Ambulatory Visit | Attending: Gastroenterology | Admitting: Gastroenterology

## 2022-07-08 DIAGNOSIS — R1900 Intra-abdominal and pelvic swelling, mass and lump, unspecified site: Secondary | ICD-10-CM | POA: Insufficient documentation

## 2022-07-08 LAB — CBC
HCT: 41.6 % (ref 36.0–46.0)
Hemoglobin: 14.3 g/dL (ref 12.0–15.0)
MCH: 28.8 pg (ref 26.0–34.0)
MCHC: 34.4 g/dL (ref 30.0–36.0)
MCV: 83.7 fL (ref 80.0–100.0)
Platelets: 320 10*3/uL (ref 150–400)
RBC: 4.97 MIL/uL (ref 3.87–5.11)
RDW: 13.1 % (ref 11.5–15.5)
WBC: 6.9 10*3/uL (ref 4.0–10.5)
nRBC: 0 % (ref 0.0–0.2)

## 2022-07-08 LAB — PROTIME-INR
INR: 1.1 (ref 0.8–1.2)
Prothrombin Time: 13.9 seconds (ref 11.4–15.2)

## 2022-07-08 MED ORDER — LIDOCAINE HCL (PF) 1 % IJ SOLN
10.0000 mL | Freq: Once | INTRAMUSCULAR | Status: AC
  Administered 2022-07-08: 10 mL via INTRADERMAL

## 2022-07-08 MED ORDER — FENTANYL CITRATE (PF) 100 MCG/2ML IJ SOLN
INTRAMUSCULAR | Status: AC | PRN
  Administered 2022-07-08: 50 ug via INTRAVENOUS
  Administered 2022-07-08 (×2): 25 ug via INTRAVENOUS

## 2022-07-08 MED ORDER — FENTANYL CITRATE (PF) 100 MCG/2ML IJ SOLN
INTRAMUSCULAR | Status: AC
  Filled 2022-07-08: qty 2

## 2022-07-08 MED ORDER — MIDAZOLAM HCL 2 MG/2ML IJ SOLN
INTRAMUSCULAR | Status: AC
  Filled 2022-07-08: qty 2

## 2022-07-08 MED ORDER — MIDAZOLAM HCL 2 MG/2ML IJ SOLN
INTRAMUSCULAR | Status: AC | PRN
  Administered 2022-07-08 (×2): .5 mg via INTRAVENOUS
  Administered 2022-07-08: 1 mg via INTRAVENOUS

## 2022-07-08 NOTE — Telephone Encounter (Signed)
Biopsy of mass performed today.  Awaiting pathology results.

## 2022-07-08 NOTE — H&P (Signed)
Chief Complaint: Patient was seen in consultation today for abdominal mass  Referring Physician(s): Beavers,Kimberly  Supervising Physician: Gilmer Mor  Patient Status: [facility] - Out-pt  History of Present Illness: Shari Berger is a 47 y.o. female with past medical history of uterine fibroids, anemia, GERD who presented to the ED with acute abdominal pain 07/02/22.  She was found to have a large abdominal mass concerning for infection vs. Carcinoma.  She was evaluated by GI who recommended proceeding with percutaneous biopsy.   Case reviewed and approved by Dr. Miles Costain.   She presents today in her usual state of health.  She has been NPO.  She does not take blood thinners. She aware of the goals of the procedure and is agreeable to proceed.   Past Medical History:  Diagnosis Date   Acid reflux    ocassional   Anemia    Cluster headache    Fibroid    uterine fibroids   Migraines     Past Surgical History:  Procedure Laterality Date   ABDOMINAL HYSTERECTOMY N/A 12/24/2019   Procedure: HYSTERECTOMY ABDOMINAL;  Surgeon: Tereso Newcomer, MD;  Location: [facility] OR;  Service: Gynecology;  Laterality: N/A;   LAPAROSCOPY N/A 12/24/2019   Procedure: diagnostic laparoscopy;  Surgeon: Tereso Newcomer, MD;  Location: [facility] OR;  Service: Gynecology;  Laterality: N/A;   WISDOM TOOTH EXTRACTION      Allergies: Oxycodone  Medications: Prior to Admission medications   Medication Sig Start Date End Date Taking? Authorizing Provider  acetaminophen (TYLENOL) 500 MG tablet Take 2 tablets (1,000 mg total) by mouth every 6 (six) hours. 12/26/19  Yes Anyanwu, Jethro Bastos, MD  ibuprofen (ADVIL) 600 MG tablet Take 1 tablet (600 mg total) by mouth every 8 (eight) hours as needed. 10/06/21  Yes Grayce Sessions, NP  omeprazole (PRILOSEC) 40 MG capsule TAKE 1 CAPSULE(40 MG) BY MOUTH DAILY 30 MINUTES BEFORE BREAKFAST 01/07/22  Yes Nandigam, Eleonore Chiquito, MD  oxyCODONE (ROXICODONE) 5 MG immediate release tablet  Take 1 tablet (5 mg total) by mouth every 6 (six) hours as needed for severe pain. 07/02/22  Yes Theresia Lo, Turkey K, DO  dicyclomine (BENTYL) 20 MG tablet Take 1 tablet (20 mg total) by mouth 4 (four) times daily -  before meals and at bedtime. 06/07/22   Smoot, Sarah A, PA-C  ondansetron (ZOFRAN) 4 MG tablet Take 1 tablet (4 mg total) by mouth every 6 (six) hours. 07/02/22   Elayne Snare K, DO  senna-docusate (SENOKOT-S) 8.6-50 MG tablet Take 1 tablet by mouth daily. 07/02/22   Rexford Maus, DO     Family History  Problem Relation Age of Onset   Depression Mother    Heart disease Father    Diabetes Father    Hypertension Father    Colon cancer Neg Hx    Esophageal cancer Neg Hx     Social History   Socioeconomic History   Marital status: Single    Spouse name: Not on file   Number of children: 1   Years of education: Not on file   Highest education level: 12th grade  Occupational History   Occupation: [employer]  Tobacco Use   Smoking status: Never   Smokeless tobacco: Never  Vaping Use   Vaping Use: Former  Substance and Sexual Activity   Alcohol use: Yes    Comment: ocassional   Drug use: No   Sexual activity: Yes    Partners: Male    Birth control/protection: None  Other  Topics Concern   Not on file  Social History Narrative   Not on file   Social Determinants of Health   Financial Resource Strain: Not on file  Food Insecurity: No Food Insecurity (11/25/2019)   Hunger Vital Sign    Worried About Running Out of Food in the Last Year: Never true    Ran Out of Food in the Last Year: Never true  Transportation Needs: No Transportation Needs (11/25/2019)   PRAPARE - Administrator, Civil Service (Medical): No    Lack of Transportation (Non-Medical): No  Physical Activity: Not on file  Stress: Not on file  Social Connections: Not on file     Review of Systems: A 12 point ROS discussed and pertinent positives are indicated in the HPI above.  All  other systems are negative.  Review of Systems  Constitutional:  Negative for fatigue and fever.  Respiratory:  Negative for cough and shortness of breath.   Cardiovascular:  Negative for chest pain.  Gastrointestinal:  Negative for abdominal pain and nausea.  Musculoskeletal:  Negative for back pain.  Psychiatric/Behavioral:  Negative for behavioral problems and confusion.     Vital Signs: BP 135/85   Pulse 83   Temp 97.6 F (36.4 C) (Oral)   Resp 17   Ht 5\' 5"  (1.651 m)   Wt 229 lb (103.9 kg)   LMP 12/18/2019   SpO2 100%   BMI 38.11 kg/m   Physical Exam Vitals and nursing note reviewed.  Constitutional:      General: She is not in acute distress.    Appearance: Normal appearance. She is not ill-appearing.  HENT:     Mouth/Throat:     Mouth: Mucous membranes are moist.     Pharynx: Oropharynx is clear.  Cardiovascular:     Rate and Rhythm: Normal rate and regular rhythm.  Pulmonary:     Effort: Pulmonary effort is normal.     Breath sounds: Normal breath sounds.  Abdominal:     General: Abdomen is flat.     Palpations: Abdomen is soft.     Tenderness: There is abdominal tenderness.  Musculoskeletal:     Cervical back: Normal range of motion.  Skin:    General: Skin is warm and dry.  Neurological:     General: No focal deficit present.     Mental Status: She is alert and oriented to person, place, and time. Mental status is at baseline.  Psychiatric:        Mood and Affect: Mood normal.        Behavior: Behavior normal.        Thought Content: Thought content normal.        Judgment: Judgment normal.      MD Evaluation Airway: WNL Heart: WNL Abdomen: WNL Chest/ Lungs: WNL ASA  Classification: 3 Mallampati/Airway Score: Two   Imaging: CT ABDOMEN PELVIS W CONTRAST  Result Date: 07/02/2022 CLINICAL DATA:  47 year old female with abdominal pain, right lower quadrant pain onset 2 days ago. EXAM: CT ABDOMEN AND PELVIS WITH CONTRAST TECHNIQUE:  Multidetector CT imaging of the abdomen and pelvis was performed using the standard protocol following bolus administration of intravenous contrast. RADIATION DOSE REDUCTION: This exam was performed according to the departmental dose-optimization program which includes automated exposure control, adjustment of the mA and/or kV according to patient size and/or use of iterative reconstruction technique. CONTRAST:  80mL OMNIPAQUE IOHEXOL 300 MG/ML  SOLN COMPARISON:  CT Abdomen and Pelvis 02/10/2021 and earlier.  FINDINGS: Lower chest: Chronic small to moderate gastric hiatal hernia not significantly changed since 2022. Mild atelectasis in the right costophrenic angle, otherwise negative lower chest. Hepatobiliary: Negative liver and gallbladder. Pancreas: Negative. Spleen: Negative. Adrenals/Urinary Tract: Normal adrenal glands. Nonobstructed kidneys. Decompressed ureters. Decompressed and unremarkable urinary bladder. Stomach/Bowel: Large bowel decompressed from the splenic flexure distally. Transverse colon appears negative. Ascending colon also appears negative. But there is a very large mixed density soft tissue mass occupying the majority of the central abdomen, central mesentery and omentum. This is 14 x 18 x 19 cm (AP by transverse by CC), it does appear inseparable from the cecum on series 2, image 63. Also is inseparable from multiple small bowel loops which otherwise seem normal. But also is conspicuously inseparable from the ventral abdominal wall soft tissues, especially the lower rectus muscles (series 2, image 62). Heterogeneous internal density, no significant surrounding inflammatory stranding. No superimposed dilated bowel. No free air. No free fluid in the abdomen. Normal appendix can be identified tracking into the pelvis on series 2, image 77 and is unrelated to the mass. Vascular/Lymphatic: Negative major arterial structures. Portal venous system is patent. No lymphadenopathy identified.  Reproductive: As before the uterus seems to be surgically absent. The right side gonadal veins can be followed to the right hemipelvis where the right ovary is suspected on series 2, image 77. Contralateral left ovary at that level also appears negative. Other: New pelvic ascites, simple fluid density. Musculoskeletal: No acute or suspicious osseous lesion. IMPRESSION: 1. Very large, 19 cm diameter (estimated 2,400 mL volume), mixed density Mass occupying the majority of the central abdominal peritoneal cavity. Unclear whether this is originating from bowel (small bowel or cecum), the mesentery, or from the abdominal wall muscle (such as Sarcoma). But the appendix and both ovaries can be identified separate from the mass and are normal. 2. Associated small volume pelvic ascites. No lymphadenopathy or metastatic disease identified. No bowel obstruction or free air. 3. Gastric hiatal hernia. Electronically Signed   By: Odessa Fleming M.D.   On: 07/02/2022 07:45    Labs:  CBC: Recent Labs    07/13/21 0520 06/07/22 1147 07/02/22 0615 07/08/22 0625  WBC 9.2 7.3 7.0 6.9  HGB 14.1 14.8 14.6 14.3  HCT 40.9 44.2 43.5 41.6  PLT 283 289 292 320    COAGS: Recent Labs    07/08/22 0625  INR 1.1    BMP: Recent Labs    07/13/21 0520 06/07/22 1154 07/02/22 0615  NA 135 139 138  K 4.0 3.9 4.0  CL 108 106 107  CO2 20* 25 24  GLUCOSE 103* 80 96  BUN 11 16 11   CALCIUM 8.7* 9.2 8.9  CREATININE 0.68 0.58 0.56  GFRNONAA >60 >60 >60    LIVER FUNCTION TESTS: Recent Labs    07/13/21 0520 06/07/22 1154 07/02/22 0615  BILITOT 0.7 0.6 0.6  AST 9* 6* 7*  ALT 17 16 12   ALKPHOS 50 50 55  PROT 6.9 7.3 6.7  ALBUMIN 3.1* 3.5 3.5    TUMOR MARKERS: No results for input(s): "AFPTM", "CEA", "CA199", "CHROMGRNA" in the last 8760 hours.  Assessment and Plan: Patient with past medical history of abdominal recently found to have large abdominal mass. IR consulted for abdominal mass biopsy at the request of  Dr. Orvan Falconer. Case reviewed by Dr. Miles Costain who approves patient for procedure.  Patient presents today in their usual state of health.  She has been NPO and is not currently on blood thinners.  Risks and benefits was discussed with the patient and/or patient's family including, but not limited to bleeding, infection, damage to adjacent structures or low yield requiring additional tests.  All of the questions were answered and there is agreement to proceed.  Consent signed and in chart.   Thank you for this interesting consult.  I greatly enjoyed meeting [facility] and look forward to participating in their care.  A copy of this report was sent to the requesting provider on this date.  Electronically Signed: Hoyt Koch, PA 07/08/2022, 8:14 AM   I spent a total of  30 Minutes   in face to face in clinical consultation, greater than 50% of which was counseling/coordinating care for abdominal mass.

## 2022-07-08 NOTE — Procedures (Signed)
Interventional Radiology Procedure Note  Procedure:   US guided biopsy of abdominal mass .  Complications: None  Specimen: Mx 16 g core EBL: none  Recommendations:  - Ok to shower tomorrow - Do not submerge for 7 days - Routine care - dc 1 hr   Signed,  Yvone Neu. Loreta Ave, DO

## 2022-07-12 ENCOUNTER — Encounter: Payer: Self-pay | Admitting: *Deleted

## 2022-07-12 LAB — SURGICAL PATHOLOGY

## 2022-07-12 NOTE — Progress Notes (Signed)
PATIENT NAVIGATOR PROGRESS NOTE  Name: Shari Berger Date: 07/12/2022 MRN: [id number]  DOB: March 10, 1976   Reason for visit:  New Patient appt  Comments:  Spoke with [name] and scheduled for 07/15/22 at 1:40 with Dr Truett Perna Reviewed directions to building and parking and also provided contact phone number to call with any questions    Time spent counseling/coordinating care: > 60 minutes

## 2022-07-13 ENCOUNTER — Other Ambulatory Visit: Payer: Self-pay | Admitting: *Deleted

## 2022-07-13 NOTE — Telephone Encounter (Signed)
Per surgical colleague update...Marland KitchenMarland KitchenPresented at tumor board today. Follow-up with [facility] and surgery planned.

## 2022-07-13 NOTE — Progress Notes (Signed)
The proposed treatment discussed in conference is for discussion purpose only and is not a binding recommendation.  The patients have not been physically examined, or presented with their treatment options.  Therefore, final treatment plans cannot be decided.  

## 2022-07-14 ENCOUNTER — Encounter: Payer: Self-pay | Admitting: *Deleted

## 2022-07-14 ENCOUNTER — Ambulatory Visit (INDEPENDENT_AMBULATORY_CARE_PROVIDER_SITE_OTHER): Payer: Self-pay

## 2022-07-14 NOTE — Progress Notes (Signed)
PATIENT NAVIGATOR PROGRESS NOTE  Name: Shari Berger Date: 07/14/2022 MRN: [id number]  DOB: [birth year]   Reason for visit:  Canceling appt here  Comments:  Patient is going to be referred to Sarcoma clinic at [facility] per recommendation of Tumor Board review      Time spent counseling/coordinating care: 30-45 minutes

## 2022-07-14 NOTE — Telephone Encounter (Signed)
Pt called back reporting that she was told by GI to contact her PCP for an alternative pain medication. States she is in severe pain, transferred to NT.

## 2022-07-14 NOTE — Telephone Encounter (Signed)
    Chief Complaint: Pt. Reports she "just found out I have stomach cancer, and I'm allergic to the oxycodone." Asking for pain medication, "it doesn't have to be a narcotic, just anything."  Symptoms: Pain 10/10 Frequency: Several weeks Pertinent Negatives: Patient denies  Disposition: ED /[] Urgent Care (no appt availability in office) / Appointment(In office/virtual)/  [facility]/ Home Care/ Refused Recommended Disposition /[] [facility]/  Follow-up with PCP Additional Notes: Please advise pt.  Answer Assessment - Initial Assessment Questions 1. DRUG NAME: "What medicine do you need to have refilled?"     Needs pain medication 2. REFILLS REMAINING: "How many refills are remaining?" (Note: The label on the medicine or pill bottle will show how many refills are remaining. If there are no refills remaining, then a renewal may be needed.)     N/a 3. EXPIRATION DATE: "What is the expiration date?" (Note: The label states when the prescription will expire, and thus can no longer be refilled.)     N/a 4. PRESCRIBING HCP: "Who prescribed it?" Reason: If prescribed by specialist, call should be referred to that group.     N/a 5. SYMPTOMS: "Do you have any symptoms?"     Pain 10/10 6. PREGNANCY: "Is there any chance that you are pregnant?" "When was your last menstrual period?"     No  Protocols used: Medication Refill and Renewal Call-A-AH

## 2022-07-14 NOTE — Telephone Encounter (Signed)
Per patient, she states GI cannot prescribe medications for pain and she has not followed up with Oncologist. She has only been referred.   Alternate on Ibuprofen and Tylenol

## 2022-07-15 ENCOUNTER — Inpatient Hospital Stay: Payer: Medicaid Other | Admitting: Oncology

## 2022-07-18 ENCOUNTER — Telehealth: Payer: Self-pay | Admitting: Gastroenterology

## 2022-07-18 NOTE — Telephone Encounter (Signed)
Returned pt call and made pt aware that per provider she can take Tylenol or Ibuprofen for pain.

## 2022-07-18 NOTE — Telephone Encounter (Signed)
Shari Berger the pt called her looking for appt information regarding a referral to [facility].  I have told her to call and ask for you. I just wanted to give you a heads up.

## 2022-07-18 NOTE — Telephone Encounter (Signed)
PT is calling to find out if we have sent a referral to [facility] per her procedure results. Please advise.

## 2022-07-19 ENCOUNTER — Ambulatory Visit (HOSPITAL_COMMUNITY): Payer: Medicaid Other

## 2022-07-27 HISTORY — PX: ABDOMINAL SURGERY: SHX537

## 2022-08-16 ENCOUNTER — Telehealth (INDEPENDENT_AMBULATORY_CARE_PROVIDER_SITE_OTHER): Payer: Self-pay

## 2022-08-23 NOTE — Telephone Encounter (Signed)
Created in error

## 2022-09-07 ENCOUNTER — Other Ambulatory Visit: Payer: Self-pay

## 2022-09-07 ENCOUNTER — Emergency Department (HOSPITAL_BASED_OUTPATIENT_CLINIC_OR_DEPARTMENT_OTHER)
Admission: EM | Admit: 2022-09-07 | Discharge: 2022-09-07 | Disposition: A | Discharge status: Home / Self Care | Payer: Medicaid Other | Attending: Emergency Medicine | Admitting: Emergency Medicine

## 2022-09-07 ENCOUNTER — Encounter (HOSPITAL_BASED_OUTPATIENT_CLINIC_OR_DEPARTMENT_OTHER): Payer: Self-pay | Admitting: Emergency Medicine

## 2022-09-07 DIAGNOSIS — S31109A Unspecified open wound of abdominal wall, unspecified quadrant without penetration into peritoneal cavity, initial encounter: Secondary | ICD-10-CM | POA: Diagnosis not present

## 2022-09-07 DIAGNOSIS — X58XXXA Exposure to other specified factors, initial encounter: Secondary | ICD-10-CM | POA: Diagnosis not present

## 2022-09-07 DIAGNOSIS — T8131XA Disruption of external operation (surgical) wound, not elsewhere classified, initial encounter: Secondary | ICD-10-CM

## 2022-09-07 NOTE — ED Notes (Signed)
Discharge paperwork given and verbally understood. 

## 2022-09-07 NOTE — ED Provider Notes (Addendum)
[place] EMERGENCY DEPARTMENT AT [facility] Provider Note   CSN: [id number] Arrival date & time: 09/07/22  0759     History  No chief complaint on file.   Shari Berger is a 47 y.o. female.  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 Lenis Noon of surgical oncology at [facility], who presents to the emergency department with concern for drainage from her surgical wound site.  The patient states that since her postop visit, her incision site has dehisced somewhat and she now has an abdominal wall wound that she has been managing outpatient.  She has not followed up for wound care with anyone yet.  She states that has dehisced further and she has been utilizing ABD pads for dressing changes and trying to keep the wound dry.  She was concerned because she was having increasing drainage from that area.  She denies any purulence to the drainage.  She states that it is yellow and fairly clear, she denies any fevers, denies any nausea, vomiting or abdominal pain.  Home Medications Prior to Admission medications   Medication Sig Start Date End Date Taking? Authorizing Provider  acetaminophen (TYLENOL) 500 MG tablet Take 2 tablets (1,000 mg total) by mouth every 6 (six) hours. 12/26/19   Anyanwu, Jethro Bastos, MD  dicyclomine (BENTYL) 20 MG tablet Take 1 tablet (20 mg total) by mouth 4 (four) times daily -  before meals and at bedtime. 06/07/22   Smoot, Shawn Route, PA-C  ibuprofen (ADVIL) 600 MG tablet Take 1 tablet (600 mg total) by mouth every 8 (eight) hours as needed. 10/06/21   Grayce Sessions, NP  omeprazole (PRILOSEC) 40 MG capsule TAKE 1 CAPSULE(40 MG) BY MOUTH DAILY 30 MINUTES BEFORE BREAKFAST 01/07/22   Nandigam,  Eleonore Chiquito, MD  ondansetron (ZOFRAN) 4 MG tablet Take 1 tablet (4 mg total) by mouth every 6 (six) hours. 07/02/22   Elayne Snare K, DO  oxyCODONE (ROXICODONE) 5 MG immediate release tablet Take 1 tablet (5 mg total) by mouth every 6 (six) hours as needed for severe pain. 07/02/22   Elayne Snare K, DO  senna-docusate (SENOKOT-S) 8.6-50 MG tablet Take 1 tablet by mouth daily. 07/02/22   Rexford Maus, DO      Allergies    Oxycodone    Review of Systems   Review of Systems  All other systems reviewed and are negative.   Physical Exam Updated Vital Signs BP 125/80   Pulse 80   Temp 97.7 F (36.5 C) (Oral)   Resp 18   LMP 12/18/2019   SpO2 98%  Physical Exam Vitals and nursing note reviewed.  Constitutional:      General: She is not in acute distress. HENT:     Head: Normocephalic and atraumatic.  Eyes:     Conjunctiva/sclera: Conjunctivae normal.     Pupils: Pupils are equal, round, and reactive to light.  Cardiovascular:     Rate and Rhythm: Normal rate and regular rhythm.  Pulmonary:     Effort: Pulmonary effort is normal. No respiratory distress.  Abdominal:     General: There is distension.     Tenderness: There is no guarding.     Comments: Abdomen distended, 2 cm abdominal wall wound along the patient's previous incision site  with fibrous tissue present draining clear serous yellow fluid, no purulence expressed, no surrounding tenderness or erythema.  Musculoskeletal:        General: No deformity or signs of injury.     Cervical back: Neck supple.  Skin:    Findings: No lesion or rash.  Neurological:     General: No focal deficit present.     Mental Status: She is alert. Mental status is at baseline.      ED Results / Procedures / Treatments   Labs (all labs ordered are listed, but only abnormal results are displayed) Labs Reviewed - No data to display  EKG None  Radiology No results found.  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 Lenis Noon of surgical oncology at [facility], who presents to the emergency department with concern for drainage from her surgical wound site.  The patient states that since her postop visit, her incision site has dehisced somewhat and she now has an abdominal wall wound that she has been managing outpatient.  She has not followed up for wound care with anyone yet.  She states that has dehisced further and she has been utilizing ABD pads for dressing changes and trying to keep the wound dry.  She was concerned because she was having increasing drainage from that area.  She denies any purulence to the drainage.  She states that it is yellow and fairly clear, she denies any fevers, denies any nausea, vomiting or abdominal pain.  On arrival, the patient was vitally stable, afebrile, not tachycardic or tachypneic, BP 125/80, saturating 98% on room air.  Physical exam concerning for a dehisced incision site with a resultant abdominal wall wound.  Surrounding fibrous tissue and yellow serous fluid was expressed.  No purulence was expressed, no surrounding erythema.  No evidence for infection of the wound.  No systemic signs of infection.  Patient is overall well-appearing, do not think further workup or evaluation is indicated at this time.  I informed the patient that she would likely need to continue to perform wound care at home, referral was placed for outpatient wound management.   1610 I called [facility] and asked to discuss the care of the  patient with Dr. Lenis Noon. He agreed with an outpatient follow-up plan, advised that she call the clinic to follow-up.   I also advised that the patient call her surgeon for follow-up for repeat assessment.  Return precautions provided in addition to wound care instructions.  Stable for discharge at this time.   Final Clinical Impression(s) / ED Diagnoses Final diagnoses:  Dehiscence of operative wound, initial encounter  Open wound of abdominal wall, initial encounter    Rx / DC Orders ED Discharge Orders     None         Ernie Avena, MD 09/07/22 0840    Ernie Avena, MD 09/07/22 [phone]

## 2022-09-07 NOTE — ED Triage Notes (Signed)
Pt here  from work with concerns for wound drainage from surgery back from May 16th

## 2022-09-07 NOTE — Discharge Instructions (Addendum)
Your wound shows evidence of fibrous tissue and will likely continue to heal by secondary intention.  Sometimes wound dehiscence requires skin grafting.  There is a chance for development of infection although there is no evidence of infection today.  Signs of infection include worsening pain, swelling, redness and warmth around the wound, expression of purulence/pus from the wound.  Please follow-up with your surgeon regarding your postoperative incision site dehiscence.  Continue dry dressing changes daily.  Antibiotics are not indicated unless there is evidence of developing infection.  If you have concern for developing infection, go to the nearest emergency department for repeat assessment.

## 2022-11-17 ENCOUNTER — Telehealth (INDEPENDENT_AMBULATORY_CARE_PROVIDER_SITE_OTHER): Payer: Medicaid Other | Admitting: Primary Care

## 2022-11-17 VITALS — BP 125/65 | HR 96 | Resp 16 | Ht 65.0 in | Wt 232.2 lb

## 2022-11-17 DIAGNOSIS — Z1159 Encounter for screening for other viral diseases: Secondary | ICD-10-CM | POA: Diagnosis not present

## 2022-11-17 DIAGNOSIS — D649 Anemia, unspecified: Secondary | ICD-10-CM | POA: Diagnosis not present

## 2022-11-17 DIAGNOSIS — Z13220 Encounter for screening for lipoid disorders: Secondary | ICD-10-CM

## 2022-11-17 DIAGNOSIS — R899 Unspecified abnormal finding in specimens from other organs, systems and tissues: Secondary | ICD-10-CM | POA: Diagnosis not present

## 2022-11-17 NOTE — Progress Notes (Signed)
[facility]  Virtual Visit   I connected with Shari Berger, on 11/17/2022 at 4:02 PM telephone and verified that I am speaking with the correct person using two identifiers.   Consent: I discussed the limitations, risks, security and privacy concerns of performing an evaluation and management service by telephone and the availability of in person appointments. I also discussed with the patient that there may be a patient responsible charge related to this service. The patient expressed understanding and agreed to proceed.   Location of Patient:   Location of Provider: [facility]   Persons participating in Telemedicine visit: Shari Nicholes Stairs,  NP   History of Present Illness: Shari Berger is a 47 year old obese female. Presented to her appt early but had to leave to take her son to foot ball practice.    Past Medical History:  Diagnosis Date   Acid reflux    ocassional   Anemia    Cluster headache    Fibroid    uterine fibroids   Migraines    Allergies  Allergen Reactions   Oxycodone Itching    Current Outpatient Medications on File Prior to Visit  Medication Sig Dispense Refill   acetaminophen (TYLENOL) 500 MG tablet Take 2 tablets (1,000 mg total) by mouth every 6 (six) hours. 30 tablet 2   dicyclomine (BENTYL) 20 MG tablet Take 1 tablet (20 mg total) by mouth 4 (four) times daily -  before meals and at bedtime. 20 tablet 0   ibuprofen (ADVIL) 600 MG tablet Take 1 tablet (600 mg total) by mouth every 8 (eight) hours as needed. 90 tablet 1   omeprazole (PRILOSEC) 40 MG capsule TAKE 1 CAPSULE(40 MG) BY MOUTH DAILY 30 MINUTES BEFORE BREAKFAST 90 capsule 1   ondansetron (ZOFRAN) 4 MG tablet Take 1 tablet (4 mg total) by mouth every 6 (six) hours. 12 tablet 0   oxyCODONE (ROXICODONE) 5 MG immediate release tablet Take 1 tablet (5 mg total) by mouth every 6 (six) hours as needed for severe  pain. 9 tablet 0   senna-docusate (SENOKOT-S) 8.6-50 MG tablet Take 1 tablet by mouth daily. 30 tablet 0   No current facility-administered medications on file prior to visit.    Observations/Objective: Blood Pressure 125/65 (BP Location: Left Arm, Patient Position: Sitting, Cuff Size: Large)   Pulse 96   Respiration 16   Height 5\' 5"  (1.651 m)   Weight 232 lb 3.2 oz (105.3 kg)   Last Menstrual Period 12/18/2019   Oxygen Saturation 100%   Body Mass Index 38.64 kg/m    Assessment and Plan: Shari Berger was seen today for follow-up.  Diagnoses and all orders for this visit:  Encounter for hepatitis C screening test for low risk patient -     HCV Ab w Reflex to Quant PCR -     Interpretation:  Lipid screening -     Lipid panel  Low hemoglobin -     CBC with Differential/Platelet  Abnormal laboratory test -     CMP14+EGFR    Follow Up Instructions: 3 months depending on labs   I discussed the assessment and treatment plan with the patient. The patient was provided an opportunity to ask questions and all were answered. The patient agreed with the plan and demonstrated an understanding of the instructions.   The patient was advised to call back or seek an in-person evaluation if the symptoms worsen or if the condition fails  to improve as anticipated.     I provided 15  minutes total of face-to-face time during this encounter including median intraservice time, reviewing previous notes, investigations, ordering medications, medical decision making, coordinating care and patient verbalized understanding at the end of the visit.    This note has been created with Education officer, environmental. Any transcriptional errors are unintentional.   Grayce Sessions, NP 11/17/2022, 4:02 PM

## 2022-11-18 LAB — CBC WITH DIFFERENTIAL/PLATELET
Basophils Absolute: 0.1 10*3/uL (ref 0.0–0.2)
Basos: 1 %
EOS (ABSOLUTE): 0.4 10*3/uL (ref 0.0–0.4)
Eos: 4 %
Hematocrit: 39.5 % (ref 34.0–46.6)
Hemoglobin: 12.7 g/dL (ref 11.1–15.9)
Immature Grans (Abs): 0 10*3/uL (ref 0.0–0.1)
Immature Granulocytes: 0 %
Lymphocytes Absolute: 3.3 10*3/uL — ABNORMAL HIGH (ref 0.7–3.1)
Lymphs: 32 %
MCH: 27.7 pg (ref 26.6–33.0)
MCHC: 32.2 g/dL (ref 31.5–35.7)
MCV: 86 fL (ref 79–97)
Monocytes Absolute: 0.7 10*3/uL (ref 0.1–0.9)
Monocytes: 7 %
Neutrophils Absolute: 5.7 10*3/uL (ref 1.4–7.0)
Neutrophils: 56 %
Platelets: 330 10*3/uL (ref 150–450)
RBC: 4.58 x10E6/uL (ref 3.77–5.28)
RDW: 13.5 % (ref 11.7–15.4)
WBC: 10.2 10*3/uL (ref 3.4–10.8)

## 2022-11-18 LAB — CMP14+EGFR
ALT: 15 IU/L (ref 0–32)
AST: 7 IU/L (ref 0–40)
Albumin: 3.6 g/dL — ABNORMAL LOW (ref 3.9–4.9)
Alkaline Phosphatase: 68 IU/L (ref 44–121)
BUN/Creatinine Ratio: 18 (ref 9–23)
BUN: 16 mg/dL (ref 6–24)
Bilirubin Total: 0.2 mg/dL (ref 0.0–1.2)
CO2: 19 mmol/L — ABNORMAL LOW (ref 20–29)
Calcium: 8.9 mg/dL (ref 8.7–10.2)
Chloride: 103 mmol/L (ref 96–106)
Creatinine, Ser: 0.89 mg/dL (ref 0.57–1.00)
Globulin, Total: 3.2 g/dL (ref 1.5–4.5)
Glucose: 95 mg/dL (ref 70–99)
Potassium: 4.1 mmol/L (ref 3.5–5.2)
Sodium: 138 mmol/L (ref 134–144)
Total Protein: 6.8 g/dL (ref 6.0–8.5)
eGFR: 81 mL/min/{1.73_m2} (ref >59)

## 2022-11-18 LAB — LIPID PANEL
Chol/HDL Ratio: 3.6 ratio (ref 0.0–4.4)
Cholesterol, Total: 175 mg/dL (ref 100–199)
HDL: 49 mg/dL (ref >39)
LDL Chol Calc (NIH): 110 mg/dL — ABNORMAL HIGH (ref 0–99)
Triglycerides: 87 mg/dL (ref 0–149)
VLDL Cholesterol Cal: 16 mg/dL (ref 5–40)

## 2022-11-18 LAB — HCV AB W REFLEX TO QUANT PCR: HCV Ab: NONREACTIVE

## 2022-11-18 LAB — HCV INTERPRETATION

## 2022-12-19 ENCOUNTER — Other Ambulatory Visit: Payer: Self-pay | Admitting: Obstetrics & Gynecology

## 2022-12-19 DIAGNOSIS — Z1231 Encounter for screening mammogram for malignant neoplasm of breast: Secondary | ICD-10-CM

## 2023-01-16 ENCOUNTER — Inpatient Hospital Stay (HOSPITAL_COMMUNITY): Admission: RE | Admit: 2023-01-16 | Payer: Medicaid Other | Source: Ambulatory Visit

## 2023-01-17 ENCOUNTER — Encounter (HOSPITAL_COMMUNITY): Payer: Self-pay

## 2023-01-17 ENCOUNTER — Ambulatory Visit (INDEPENDENT_AMBULATORY_CARE_PROVIDER_SITE_OTHER): Payer: Medicaid Other

## 2023-01-17 ENCOUNTER — Ambulatory Visit (HOSPITAL_COMMUNITY)
Admission: RE | Admit: 2023-01-17 | Discharge: 2023-01-17 | Disposition: A | Discharge status: Home / Self Care | Payer: Medicaid Other | Source: Ambulatory Visit | Attending: Family Medicine | Admitting: Family Medicine

## 2023-01-17 VITALS — BP 124/79 | HR 97 | Temp 97.3°F | Resp 16

## 2023-01-17 DIAGNOSIS — M25561 Pain in right knee: Secondary | ICD-10-CM

## 2023-01-17 DIAGNOSIS — M79604 Pain in right leg: Secondary | ICD-10-CM

## 2023-01-17 MED ORDER — IBUPROFEN 800 MG PO TABS: 800.0000 mg | ORAL_TABLET | Freq: Three times a day (TID) | ORAL | 0 refills | Status: AC | PRN

## 2023-01-17 NOTE — ED Triage Notes (Signed)
Pt states she tripped over some boxes at work 2 weeks ago.  C/O pain to RLE.  States she has been taking Tylenol at home with some relief.

## 2023-01-17 NOTE — Discharge Instructions (Addendum)
Your x-rays did not show any broken bones.  The radiologist will also read your x-ray, and if their interpretation differs significantly from mine, we will call you.  You may have a soft tissue injury that cannot be seen on plain x-rays.  Please follow-up with occupational health clinic about this issue.  You may end up needing to be sent to physical therapy and/or you may need to see a specialist.  Take ibuprofen 800 mg--1 tab every 8 hours as needed for pain.

## 2023-01-17 NOTE — ED Provider Notes (Addendum)
[facility]    CSN: [id number] Arrival date & time: 01/17/23  1403      History   Chief Complaint Chief Complaint  Patient presents with   Leg Pain    HPI Shari Berger is a 47 y.o. female.    Leg Pain Here for 15 days of right leg pain.   She hit the leg on a piece of metal at work about 2 weeks ago. That scrape is healing.  But her right knee is swelling and hurting. The swelling started a few days ago.  Has been using tylenol and ice.  PSH: has had a hyst.  Allergic to oxycodone, causes itching.    Past Medical History:  Diagnosis Date   Acid reflux    ocassional   Anemia    Cluster headache    Fibroid    uterine fibroids   Migraines     Patient Active Problem List   Diagnosis Date Noted   Abdominal pain, epigastric 09/21/2021   Nausea and vomiting 09/21/2021   Pneumoperitoneum of unknown etiology 02/06/2020   S/P TAH (total abdominal hysterectomy) 12/24/2019   Acute postoperative anemia due to expected blood loss 12/24/2019   Anemia due to chronic blood loss 12/19/2019   Fibroids 12/19/2019   Abnormal uterine bleeding (AUB) 02/01/2019   Tendonitis of foot 04/12/2017   Pelvic pain 04/12/2017   Migraines 04/11/2017   Acid reflux 04/11/2017    Past Surgical History:  Procedure Laterality Date   ABDOMINAL HYSTERECTOMY N/A 12/24/2019   Procedure: HYSTERECTOMY ABDOMINAL;  Surgeon: Tereso Newcomer, MD;  Location: [facility];  Service: Gynecology;  Laterality: N/A;   LAPAROSCOPY N/A 12/24/2019   Procedure: diagnostic laparoscopy;  Surgeon: Tereso Newcomer, MD;  Location: [facility] OR;  Service: Gynecology;  Laterality: N/A;   WISDOM TOOTH EXTRACTION      OB History     Gravida  2   Para  1   Term  1   Preterm  0   AB  1   Living  1      SAB      IAB      Ectopic  1   Multiple      Live Births  1            Home Medications    Prior to Admission medications   Medication Sig Start Date End Date Taking? Authorizing  Provider  ibuprofen (ADVIL) 800 MG tablet Take 1 tablet (800 mg total) by mouth every 8 (eight) hours as needed (pain). 01/17/23  Yes Zenia Resides, MD  acetaminophen (TYLENOL) 500 MG tablet Take 2 tablets (1,000 mg total) by mouth every 6 (six) hours. 12/26/19   Anyanwu, Jethro Bastos, MD  dicyclomine (BENTYL) 20 MG tablet Take 1 tablet (20 mg total) by mouth 4 (four) times daily -  before meals and at bedtime. 06/07/22   Smoot, Shawn Route, PA-C  omeprazole (PRILOSEC) 40 MG capsule TAKE 1 CAPSULE(40 MG) BY MOUTH DAILY 30 MINUTES BEFORE BREAKFAST 01/07/22   Nandigam, Eleonore Chiquito, MD  senna-docusate (SENOKOT-S) 8.6-50 MG tablet Take 1 tablet by mouth daily. 07/02/22   Rexford Maus, DO    Family History Family History  Problem Relation Age of Onset   Depression Mother    Heart disease Father    Diabetes Father    Hypertension Father    Colon cancer Neg Hx    Esophageal cancer Neg Hx     Social History Social History   Tobacco  Use   Smoking status: Never   Smokeless tobacco: Never  Vaping Use   Vaping status: Former  Substance Use Topics   Alcohol use: Yes    Comment: ocassional   Drug use: No     Allergies   Oxycodone   Review of Systems Review of Systems   Physical Exam Triage Vital Signs ED Triage Vitals [01/17/23 1416]  Encounter Vitals Group     BP 124/79     Systolic BP Percentile      Diastolic BP Percentile      Pulse Rate 97     Resp 16     Temp (!) 97.3 F (36.3 C)     Temp Source Oral     SpO2 98 %     Weight      Height      Head Circumference      Peak Flow      Pain Score 6     Pain Loc      Pain Education      Exclude from Growth Chart    No data found.  Updated Vital Signs BP 124/79 (BP Location: Left Arm)   Pulse 97   Temp (!) 97.3 F (36.3 C) (Oral)   Resp 16   LMP 12/18/2019   SpO2 98%   Visual Acuity Right Eye Distance:   Left Eye Distance:   Bilateral Distance:    Right Eye Near:   Left Eye Near:    Bilateral Near:      Physical Exam Vitals reviewed.  Constitutional:      General: She is not in acute distress.    Appearance: She is not ill-appearing, toxic-appearing or diaphoretic.  Musculoskeletal:     Comments: There is some mild tenderness and a little swelling over the anterior lower pole of the knee.  The abrasion is about 1 cm in diameter the most that is healing well.  There is no surrounding erythema and there is no drainage.  Skin:    Coloration: Skin is not jaundiced or pale.  Neurological:     General: No focal deficit present.     Mental Status: She is alert and oriented to person, place, and time.  Psychiatric:        Behavior: Behavior normal.      UC Treatments / Results  Labs (all labs ordered are listed, but only abnormal results are displayed) Labs Reviewed - No data to display  EKG   Radiology No results found.  Procedures Procedures (including critical care time)  Medications Ordered in UC Medications - No data to display  Initial Impression / Assessment and Plan / UC Course  I have reviewed the triage vital signs and the nursing notes.  Pertinent labs & imaging results that were available during my care of the patient were reviewed by me and considered in my medical decision making (see chart for details).      X-ray by my review is negative for any acute bony pathology. She is advised of radiology overread.  Knee sleeve brace is provided  and ibuprofen is sent to the pharmacy.  I have given her contact information for occupational health clinic with whom she should follow-up. Final Clinical Impressions(s) / UC Diagnoses   Final diagnoses:  Acute pain of right knee  Right leg pain     Discharge Instructions      Your x-rays did not show any broken bones.  The radiologist will also read your x-ray, and if  their interpretation differs significantly from mine, we will call you.  You may have a soft tissue injury that cannot be seen on plain  x-rays.  Please follow-up with occupational health clinic about this issue.  You may end up needing to be sent to physical therapy and/or you may need to see a specialist.  Take ibuprofen 800 mg--1 tab every 8 hours as needed for pain.       ED Prescriptions     Medication Sig Dispense Auth. Provider   ibuprofen (ADVIL) 800 MG tablet Take 1 tablet (800 mg total) by mouth every 8 (eight) hours as needed (pain). 21 tablet Emmry Hinsch, Janace Aris, MD      PDMP not reviewed this encounter.   Zenia Resides, MD 01/17/23 1507    Zenia Resides, MD 01/17/23 [phone]

## 2023-01-23 ENCOUNTER — Ambulatory Visit
Admission: RE | Admit: 2023-01-23 | Discharge: 2023-01-23 | Disposition: A | Discharge status: Home / Self Care | Payer: Medicaid Other | Source: Ambulatory Visit | Attending: Primary Care | Admitting: Primary Care

## 2023-01-23 ENCOUNTER — Ambulatory Visit (INDEPENDENT_AMBULATORY_CARE_PROVIDER_SITE_OTHER): Payer: Medicaid Other | Admitting: Primary Care

## 2023-01-23 DIAGNOSIS — Z1231 Encounter for screening mammogram for malignant neoplasm of breast: Secondary | ICD-10-CM | POA: Diagnosis not present

## 2023-01-27 ENCOUNTER — Telehealth (INDEPENDENT_AMBULATORY_CARE_PROVIDER_SITE_OTHER): Payer: Self-pay | Admitting: Primary Care

## 2023-01-27 NOTE — Telephone Encounter (Signed)
Called to confirm apt. LM left

## 2023-01-30 ENCOUNTER — Ambulatory Visit (INDEPENDENT_AMBULATORY_CARE_PROVIDER_SITE_OTHER): Payer: Medicaid Other | Admitting: Primary Care

## 2023-01-31 ENCOUNTER — Encounter (INDEPENDENT_AMBULATORY_CARE_PROVIDER_SITE_OTHER): Payer: Self-pay | Admitting: Primary Care

## 2023-01-31 ENCOUNTER — Ambulatory Visit (INDEPENDENT_AMBULATORY_CARE_PROVIDER_SITE_OTHER): Payer: 59 | Admitting: Primary Care

## 2023-01-31 VITALS — BP 122/83 | HR 96 | Resp 16 | Ht 65.0 in | Wt 241.0 lb

## 2023-01-31 DIAGNOSIS — Z1211 Encounter for screening for malignant neoplasm of colon: Secondary | ICD-10-CM

## 2023-01-31 DIAGNOSIS — Z2821 Immunization not carried out because of patient refusal: Secondary | ICD-10-CM

## 2023-01-31 DIAGNOSIS — L309 Dermatitis, unspecified: Secondary | ICD-10-CM | POA: Diagnosis not present

## 2023-01-31 NOTE — Progress Notes (Signed)
    [facility]  Subjective:   Shari Berger is an 47 y.o. female who presents for evaluation and treatment of a rash. Onset of symptoms was since adolescence ago, and has been gradually worsening since that time. Risk factors include: dry skin . Treatment modalities that have been used in the past include: .  The following portions of the patient's history were reviewed and updated as appropriate: allergies, current medications, past family history, past medical history, past social history, past surgical history, and problem list.  Review of Systems Pertinent items are noted in HPI.  Objective:  BP 122/83   Pulse 96   Resp 16   Ht 5\' 5"  (1.651 m)   Wt 241 lb (109.3 kg)   LMP 12/18/2019 Comment: partial  SpO2 100%   BMI 40.10 kg/m   General Appearance:    Alert, cooperative, no distress, appears stated age Head:    Normocephalic, without obvious abnormality, atraumatic Eyes:    PERRL, conjunctiva/corneas clear, EOM's intact, fundi    benign, both eyes Ears:    Normal TM's and external ear canals, both ears Nose:   Nares normal, septum midline, mucosa normal, no drainage    or sinus tenderness Throat:   Lips, mucosa, and tongue normal; teeth and gums normal Neck:   Supple, symmetrical, trachea midline, no adenopathy;    thyroid:  no enlargement/tenderness/nodules; no carotid   bruit or JVD Back:     Symmetric, no curvature, ROM normal, no CVA tenderness Lungs:     Clear to auscultation bilaterally, respirations unlabored Chest Wall:    No tenderness or deformity  Heart:    Regular rate and rhythm, S1 and S2 normal, no murmur, rub   or gallop  Breast Exam:    No tenderness, masses, or nipple abnormality Abdomen:     Soft, non-tender, bowel sounds active all four quadrants,    no masses, no organomegaly Genitalia:    Normal female without lesion, discharge or tenderness Rectal:    Normal tone, normal prostate, no masses or tenderness;   guaiac negative  stool Extremities:   Extremities normal, atraumatic, no cyanosis or edema Pulses:   2+ and symmetric all extremities Skin:   Skin color, texture, turgor normal, no rashes or lesions Lymph nodes:   Cervical, supraclavicular, and axillary nodes normal Neurologic:   CNII-XII intact, normal strength, sensation and reflexes    throughout   Assessment:  Eczema, gradually worsening  Plan:  Treatment: use mild soaps with lotions in them ([place] - Dove) and moisturizers - Alpha Keri/Vaseline.      This note has been created with Education officer, environmental. Any transcriptional errors are unintentional.   Grayce Sessions, NP 01/31/2023, 3:10 PM

## 2023-02-03 ENCOUNTER — Encounter (INDEPENDENT_AMBULATORY_CARE_PROVIDER_SITE_OTHER): Payer: Self-pay | Admitting: Primary Care

## 2023-02-03 ENCOUNTER — Ambulatory Visit (INDEPENDENT_AMBULATORY_CARE_PROVIDER_SITE_OTHER): Payer: 59

## 2023-02-17 ENCOUNTER — Telehealth (INDEPENDENT_AMBULATORY_CARE_PROVIDER_SITE_OTHER): Payer: Self-pay | Admitting: Primary Care

## 2023-02-17 NOTE — Telephone Encounter (Signed)
Called pt to remind them about apt. Pt stated she was just seen annd would like to reschedule.

## 2023-02-20 ENCOUNTER — Ambulatory Visit (INDEPENDENT_AMBULATORY_CARE_PROVIDER_SITE_OTHER): Payer: 59 | Admitting: Primary Care

## 2023-03-24 ENCOUNTER — Other Ambulatory Visit: Payer: Self-pay | Admitting: Gastroenterology

## 2023-04-03 ENCOUNTER — Ambulatory Visit: Payer: 59 | Admitting: Physician Assistant

## 2023-05-02 ENCOUNTER — Encounter (HOSPITAL_BASED_OUTPATIENT_CLINIC_OR_DEPARTMENT_OTHER): Payer: Self-pay | Admitting: Emergency Medicine

## 2023-05-02 ENCOUNTER — Emergency Department (HOSPITAL_BASED_OUTPATIENT_CLINIC_OR_DEPARTMENT_OTHER): Payer: Medicaid Other | Admitting: Radiology

## 2023-05-02 ENCOUNTER — Emergency Department (HOSPITAL_BASED_OUTPATIENT_CLINIC_OR_DEPARTMENT_OTHER)
Admission: EM | Admit: 2023-05-02 | Discharge: 2023-05-02 | Discharge status: Against Medical Advice | Payer: Medicaid Other | Attending: Emergency Medicine | Admitting: Emergency Medicine

## 2023-05-02 ENCOUNTER — Other Ambulatory Visit: Payer: Self-pay

## 2023-05-02 DIAGNOSIS — Z20822 Contact with and (suspected) exposure to covid-19: Secondary | ICD-10-CM | POA: Insufficient documentation

## 2023-05-02 DIAGNOSIS — Z5321 Procedure and treatment not carried out due to patient leaving prior to being seen by health care provider: Secondary | ICD-10-CM | POA: Insufficient documentation

## 2023-05-02 DIAGNOSIS — R0789 Other chest pain: Secondary | ICD-10-CM | POA: Insufficient documentation

## 2023-05-02 DIAGNOSIS — R519 Headache, unspecified: Secondary | ICD-10-CM | POA: Insufficient documentation

## 2023-05-02 DIAGNOSIS — R059 Cough, unspecified: Secondary | ICD-10-CM | POA: Insufficient documentation

## 2023-05-02 LAB — CBC
HCT: 39.7 % (ref 36.0–46.0)
Hemoglobin: 13.4 g/dL (ref 12.0–15.0)
MCH: 27.9 pg (ref 26.0–34.0)
MCHC: 33.8 g/dL (ref 30.0–36.0)
MCV: 82.7 fL (ref 80.0–100.0)
Platelets: 244 10*3/uL (ref 150–400)
RBC: 4.8 MIL/uL (ref 3.87–5.11)
RDW: 13.9 % (ref 11.5–15.5)
WBC: 4.3 10*3/uL (ref 4.0–10.5)
nRBC: 0 % (ref 0.0–0.2)

## 2023-05-02 LAB — BASIC METABOLIC PANEL
Anion gap: 7 (ref 5–15)
BUN: 14 mg/dL (ref 6–20)
CO2: 26 mmol/L (ref 22–32)
Calcium: 8.6 mg/dL — ABNORMAL LOW (ref 8.9–10.3)
Chloride: 103 mmol/L (ref 98–111)
Creatinine, Ser: 0.72 mg/dL (ref 0.44–1.00)
GFR, Estimated: >60 mL/min (ref >60)
Glucose, Bld: 97 mg/dL (ref 70–99)
Potassium: 3.3 mmol/L — ABNORMAL LOW (ref 3.5–5.1)
Sodium: 136 mmol/L (ref 135–145)

## 2023-05-02 LAB — RESP PANEL BY RT-PCR (RSV, FLU A&B, COVID)  RVPGX2
Influenza A by PCR: POSITIVE — AB
Influenza B by PCR: NEGATIVE
Resp Syncytial Virus by PCR: NEGATIVE
SARS Coronavirus 2 by RT PCR: NEGATIVE

## 2023-05-02 LAB — TROPONIN I (HIGH SENSITIVITY): Troponin I (High Sensitivity): <2 ng/L (ref <18)

## 2023-05-02 NOTE — ED Triage Notes (Signed)
Pt via POV c/o productive tan cough since Thursday of last week with headache and left-sided chest pain that started today, worse during coughing fits. Pain rated 8/10 intermittent.

## 2023-05-03 ENCOUNTER — Emergency Department (HOSPITAL_BASED_OUTPATIENT_CLINIC_OR_DEPARTMENT_OTHER)
Admission: EM | Admit: 2023-05-03 | Discharge: 2023-05-03 | Disposition: A | Discharge status: Home / Self Care | Payer: Medicaid Other | Attending: Emergency Medicine | Admitting: Emergency Medicine

## 2023-05-03 ENCOUNTER — Encounter (HOSPITAL_BASED_OUTPATIENT_CLINIC_OR_DEPARTMENT_OTHER): Payer: Self-pay

## 2023-05-03 DIAGNOSIS — J101 Influenza due to other identified influenza virus with other respiratory manifestations: Secondary | ICD-10-CM | POA: Insufficient documentation

## 2023-05-03 DIAGNOSIS — J189 Pneumonia, unspecified organism: Secondary | ICD-10-CM | POA: Diagnosis not present

## 2023-05-03 DIAGNOSIS — R079 Chest pain, unspecified: Secondary | ICD-10-CM | POA: Diagnosis present

## 2023-05-03 MED ORDER — DOXYCYCLINE HYCLATE 100 MG PO CAPS: 100.0000 mg | ORAL_CAPSULE | Freq: Two times a day (BID) | ORAL | 0 refills | Status: DC

## 2023-05-03 MED ORDER — BENZONATATE 100 MG PO CAPS: 100.0000 mg | ORAL_CAPSULE | Freq: Three times a day (TID) | ORAL | 0 refills | Status: DC

## 2023-05-03 NOTE — Discharge Instructions (Addendum)
 Your workup from last night at [facility], showed that you do have influenza A.  Since has been over 2 days Tamiflu would not be helpful at this point.  Your chest x-ray showed a new developing pneumonia.  Will start you on doxycycline .  Antibiotic sent into your pharmacy.  If you have any worsening symptoms please return to the emergency room otherwise follow-up with your primary care provider.  Continue with the incentive spirometer.

## 2023-05-03 NOTE — ED Triage Notes (Signed)
States was here last night for flu symptoms.  Lab work and xray done.  Positive Flu, chest xray possible pneumonia  Denies Short of breath

## 2023-05-03 NOTE — ED Provider Notes (Signed)
 [place] EMERGENCY DEPARTMENT AT [facility] Provider Note   CSN: [id number] Arrival date & time: 05/03/23  1029     History  Chief Complaint  Patient presents with   Influenza    Shari Berger is a 48 y.o. female.  48 year old female presents today for flulike symptoms, [place] this Thursday of last week.  Also endorses chest pain with coughing.  Her main concern for coming in today is the chest pain with coughing.  She went to [facility] last night and had blood work as well as chest x-ray done but left due to wait.  The history is provided by the patient. No language interpreter was used.       Home Medications Prior to Admission medications   Medication Sig Start Date End Date Taking? Authorizing Provider  doxycycline  (VIBRAMYCIN ) 100 MG capsule Take 1 capsule (100 mg total) by mouth 2 (two) times daily. 05/03/23  Yes Hildegard, Lovette Merta, PA-C  acetaminophen  (TYLENOL ) 500 MG tablet Take 2 tablets (1,000 mg total) by mouth every 6 (six) hours. 12/26/19   Anyanwu, Ugonna A, MD  dicyclomine  (BENTYL ) 20 MG tablet Take 1 tablet (20 mg total) by mouth 4 (four) times daily -  before meals and at bedtime. 06/07/22   Smoot, Lauraine LABOR, PA-C  ibuprofen  (ADVIL ) 800 MG tablet Take 1 tablet (800 mg total) by mouth every 8 (eight) hours as needed (pain). 01/17/23   Vonna Sharlet POUR, MD  omeprazole  (PRILOSEC) 40 MG capsule TAKE 1 CAPSULE(40 MG) BY MOUTH DAILY 30 MINUTES BEFORE BREAKFAST 03/27/23   Nandigam, Kavitha V, MD  senna-docusate (SENOKOT-S) 8.6-50 MG tablet Take 1 tablet by mouth daily. 07/02/22   Kingsley, Victoria K, DO      Allergies    Oxycodone     Review of Systems   Review of Systems  Constitutional:  Negative for chills and fever.  Respiratory:  Negative for cough and shortness of breath.   Cardiovascular:  Positive for chest pain.  Neurological:  Negative for light-headedness.  All other systems reviewed and are negative.   Physical Exam Updated Vital Signs BP (!) 147/93  (BP Location: Right Arm)   Pulse 80   Temp 99.2 F (37.3 C)   Resp 18   Ht 5' 5 (1.651 m)   Wt 104.3 kg   LMP 12/18/2019 Comment: partial  SpO2 100%   BMI 38.26 kg/m  Physical Exam Vitals and nursing note reviewed.  Constitutional:      General: She is not in acute distress.    Appearance: Normal appearance. She is not ill-appearing.  HENT:     Head: Normocephalic and atraumatic.     Nose: Nose normal.  Eyes:     Conjunctiva/sclera: Conjunctivae normal.  Cardiovascular:     Rate and Rhythm: Normal rate and regular rhythm.  Pulmonary:     Effort: Pulmonary effort is normal. No respiratory distress.     Breath sounds: Normal breath sounds. No wheezing.  Abdominal:     General: There is no distension.     Palpations: Abdomen is soft.     Tenderness: There is no abdominal tenderness.  Musculoskeletal:        General: No deformity. Normal range of motion.     Cervical back: Normal range of motion.  Skin:    Findings: No rash.  Neurological:     Mental Status: She is alert.     ED Results / Procedures / Treatments   Labs (all labs ordered are listed, but only abnormal  results are displayed) Labs Reviewed - No data to display  EKG None  Radiology DG Chest 2 View Result Date: 05/02/2023 CLINICAL DATA:  Productive cough for 5 days. EXAM: CHEST - 2 VIEW COMPARISON:  Chest radiographs 02/06/2020.  CT 07/06/2016. FINDINGS: The heart size and mediastinal contours are stable. There is patchy left lower lobe airspace disease suspicious for early pneumonia. The right lung appears clear. No pleural effusion or pneumothorax. The bones appear unremarkable. IMPRESSION: Patchy left lower lobe airspace disease suspicious for early pneumonia. Electronically Signed   By: Elsie Perone M.D.   On: 05/02/2023 18:15    Procedures Procedures    Medications Ordered in ED Medications - No data to display  ED Course/ Medical Decision Making/ A&P                                  Medical Decision Making Risk Prescription drug management.   Medical Decision Making / ED Course   This patient presents to the ED for concern of cough, this involves an extensive number of treatment options, and is a complaint that carries with it a high risk of complications and morbidity.  The differential diagnosis includes viral URI, pneumonia, PE, ACS  MDM: 48 year old female presents today for concern of flulike symptoms as well as chest pain with cough.  Denies shortness of breath, fever, lightheadedness.  Tolerating p.o. intake without difficulty.  Workup reviewed from last night.  Without leukocytosis, renal function preserved.  Troponin undetectable.  Low suspicion for ACS.  Chest x-ray shows developing pneumonia.  Will prescribe doxycycline .  Tessalon  Perles given for cough.  Patient discharged in stable condition.  Return precaution discussed.  Patient voices understanding and is in agreement with plan.  Low suspicion for PE as she is not hypoxic, tachypneic, or tachycardic.  No risk on Wells criteria.  Lab Tests: -I ordered, reviewed, and interpreted labs.   The pertinent results include:   Labs Reviewed - No data to display    EKG  EKG Interpretation Date/Time:    Ventricular Rate:    PR Interval:    QRS Duration:    QT Interval:    QTC Calculation:   R Axis:      Text Interpretation:           Imaging Studies ordered: I ordered imaging studies including cxr I independently visualized and interpreted imaging. I agree with the radiologist interpretation   Medicines ordered and prescription drug management: Meds ordered this encounter  Medications   doxycycline  (VIBRAMYCIN ) 100 MG capsule    Sig: Take 1 capsule (100 mg total) by mouth 2 (two) times daily.    Dispense:  20 capsule    Refill:  0    Supervising Provider:   CLEOTILDE ROGUE [[id number]]   benzonatate  (TESSALON ) 100 MG capsule    Sig: Take 1 capsule (100 mg total) by mouth every 8 (eight)  hours.    Dispense:  21 capsule    Refill:  0    Supervising Provider:   CLEOTILDE ROGUE [[id number]]    -I have reviewed the patients home medicines and have made adjustments as needed  Reevaluation: After the interventions noted above, I reevaluated the patient and found that they have :stayed the same  Co morbidities that complicate the patient evaluation  Past Medical History:  Diagnosis Date   Acid reflux    ocassional   Anemia    Cluster headache  Fibroid    uterine fibroids   Migraines       Dispostion: Discharged in stable condition.  Return precaution discussed.  Patient voices understanding and is in agreement with plan.   Final Clinical Impression(s) / ED Diagnoses Final diagnoses:  Influenza A  Pneumonia due to infectious organism, unspecified laterality, unspecified part of lung    Rx / DC Orders ED Discharge Orders          Ordered    doxycycline  (VIBRAMYCIN ) 100 MG capsule  2 times daily        05/03/23 1502              Hildegard Loge, [place] 05/03/23 1512    Cottie Donnice PARAS, MD 05/04/23 [phone]

## 2023-05-29 ENCOUNTER — Ambulatory Visit: Payer: Medicaid Other | Admitting: Nurse Practitioner

## 2023-05-29 NOTE — Progress Notes (Deleted)
     05/29/2023 Shari Berger [id number] January 29, 1976   Chief Complaint:  History of Present Illness: Shari Berger is a 48 year old female with a past medical history of H. Pylori. Previously known by Dr. Orvan Falconer.   She completed 2 weeks of treatment for H pylori and her symptoms improved. Recent [employer] testing was negative for H pylori.   EGD 10/29/2021: showed a small hiatal hernia and gastritis. Biopsies confirmed H. pylori. Esophageal and duodenal biopsies were normal.      Latest Ref Rng & Units 05/02/2023    5:09 PM 11/17/2022    4:12 PM 07/08/2022    6:25 AM  CBC  WBC 4.0 - 10.5 K/uL 4.3  10.2  6.9   Hemoglobin 12.0 - 15.0 g/dL 52.8  41.3  24.4   Hematocrit 36.0 - 46.0 % 39.7  39.5  41.6   Platelets 150 - 400 K/uL 244  330  320        Latest Ref Rng & Units 05/02/2023    5:09 PM 11/17/2022    4:12 PM 07/02/2022    6:15 AM  CMP  Glucose 70 - 99 mg/dL 97  95  96   BUN 6 - 20 mg/dL 14  16  11    Creatinine 0.44 - 1.00 mg/dL 0.10  2.72  5.36   Sodium 135 - 145 mmol/L 136  138  138   Potassium 3.5 - 5.1 mmol/L 3.3  4.1  4.0   Chloride 98 - 111 mmol/L 103  103  107   CO2 22 - 32 mmol/L 26  19  24    Calcium 8.9 - 10.3 mg/dL 8.6  8.9  8.9   Total Protein 6.0 - 8.5 g/dL  6.8  6.7   Total Bilirubin 0.0 - 1.2 mg/dL  0.2  0.6   Alkaline Phos 44 - 121 IU/L  68  55   AST 0 - 40 IU/L  7  7   ALT 0 - 32 IU/L  15  12         Current Medications, Allergies, Past Medical History, Past Surgical History, Family History and Social History were reviewed in [employer] record.   Review of Systems:   Constitutional: Negative for fever, sweats, chills or weight loss.  Respiratory: Negative for shortness of breath.   Cardiovascular: Negative for chest pain, palpitations and leg swelling.  Gastrointestinal: See HPI.  Musculoskeletal: Negative for back pain or muscle aches.  Neurological: Negative for dizziness, headaches or paresthesias.    Physical Exam: LMP  12/18/2019 Comment: partial General: in no acute distress. Head: Normocephalic and atraumatic. Eyes: No scleral icterus. Conjunctiva pink . Ears: Normal auditory acuity. Mouth: Dentition intact. No ulcers or lesions.  Lungs: Clear throughout to auscultation. Heart: Regular rate and rhythm, no murmur. Abdomen: Soft, nontender and nondistended. No masses or hepatomegaly. Normal bowel sounds x 4 quadrants.  Rectal: Deferred.  Musculoskeletal: Symmetrical with no gross deformities. Extremities: No edema. Neurological: Alert oriented x 4. No focal deficits.  Psychological: Alert and cooperative. Normal mood and affect  Assessment and Recommendations: ***

## 2023-06-03 ENCOUNTER — Emergency Department (HOSPITAL_BASED_OUTPATIENT_CLINIC_OR_DEPARTMENT_OTHER)

## 2023-06-03 ENCOUNTER — Other Ambulatory Visit: Payer: Self-pay

## 2023-06-03 ENCOUNTER — Observation Stay (HOSPITAL_BASED_OUTPATIENT_CLINIC_OR_DEPARTMENT_OTHER)
Admission: EM | Admit: 2023-06-03 | Discharge: 2023-06-04 | Disposition: A | Discharge status: Home / Self Care | Attending: Nurse Practitioner | Admitting: Nurse Practitioner

## 2023-06-03 ENCOUNTER — Encounter (HOSPITAL_BASED_OUTPATIENT_CLINIC_OR_DEPARTMENT_OTHER): Payer: Self-pay | Admitting: *Deleted

## 2023-06-03 DIAGNOSIS — K567 Ileus, unspecified: Secondary | ICD-10-CM | POA: Diagnosis not present

## 2023-06-03 DIAGNOSIS — Z9889 Other specified postprocedural states: Secondary | ICD-10-CM | POA: Insufficient documentation

## 2023-06-03 DIAGNOSIS — R1013 Epigastric pain: Principal | ICD-10-CM

## 2023-06-03 LAB — CBC
HCT: 40.3 % (ref 36.0–46.0)
Hemoglobin: 13.4 g/dL (ref 12.0–15.0)
MCH: 27.9 pg (ref 26.0–34.0)
MCHC: 33.3 g/dL (ref 30.0–36.0)
MCV: 83.8 fL (ref 80.0–100.0)
Platelets: 310 10*3/uL (ref 150–400)
RBC: 4.81 MIL/uL (ref 3.87–5.11)
RDW: 13.9 % (ref 11.5–15.5)
WBC: 10.9 10*3/uL — ABNORMAL HIGH (ref 4.0–10.5)
nRBC: 0 % (ref 0.0–0.2)

## 2023-06-03 LAB — URINALYSIS, ROUTINE W REFLEX MICROSCOPIC
Bilirubin Urine: NEGATIVE
Glucose, UA: NEGATIVE mg/dL
Hgb urine dipstick: NEGATIVE
Ketones, ur: 15 mg/dL — AB
Leukocytes,Ua: NEGATIVE
Nitrite: NEGATIVE
Specific Gravity, Urine: 1.028 (ref 1.005–1.030)
pH: 7 (ref 5.0–8.0)

## 2023-06-03 LAB — COMPREHENSIVE METABOLIC PANEL
ALT: 11 U/L (ref 0–44)
AST: 5 U/L — ABNORMAL LOW (ref 15–41)
Albumin: 3.6 g/dL (ref 3.5–5.0)
Alkaline Phosphatase: 62 U/L (ref 38–126)
Anion gap: 5 (ref 5–15)
BUN: 9 mg/dL (ref 6–20)
CO2: 25 mmol/L (ref 22–32)
Calcium: 8.9 mg/dL (ref 8.9–10.3)
Chloride: 105 mmol/L (ref 98–111)
Creatinine, Ser: 0.6 mg/dL (ref 0.44–1.00)
GFR, Estimated: >60 mL/min (ref >60)
Glucose, Bld: 107 mg/dL — ABNORMAL HIGH (ref 70–99)
Potassium: 3.6 mmol/L (ref 3.5–5.1)
Sodium: 135 mmol/L (ref 135–145)
Total Bilirubin: 0.7 mg/dL (ref 0.0–1.2)
Total Protein: 7 g/dL (ref 6.5–8.1)

## 2023-06-03 LAB — LIPASE, BLOOD: Lipase: 12 U/L (ref 11–51)

## 2023-06-03 MED ORDER — LACTATED RINGERS IV SOLN: INTRAVENOUS | Status: DC

## 2023-06-03 MED ORDER — HYDROMORPHONE HCL 1 MG/ML IJ SOLN
0.5000 mg | Freq: Once | INTRAMUSCULAR | Status: AC
  Administered 2023-06-03: 0.5 mg via INTRAVENOUS
  Filled 2023-06-03: qty 1

## 2023-06-03 MED ORDER — LACTATED RINGERS IV BOLUS
1000.0000 mL | Freq: Once | INTRAVENOUS | Status: AC
  Administered 2023-06-04: 1000 mL via INTRAVENOUS

## 2023-06-03 MED ORDER — IOHEXOL 300 MG/ML  SOLN
100.0000 mL | Freq: Once | INTRAMUSCULAR | Status: AC | PRN
  Administered 2023-06-03: 100 mL via INTRAVENOUS

## 2023-06-03 MED ORDER — HYDROMORPHONE HCL 1 MG/ML IJ SOLN
0.5000 mg | INTRAMUSCULAR | Status: DC | PRN
  Administered 2023-06-04 (×2): 0.5 mg via INTRAVENOUS
  Filled 2023-06-03: qty 1
  Filled 2023-06-03: qty 0.5

## 2023-06-03 MED ORDER — PANTOPRAZOLE SODIUM 40 MG IV SOLR
40.0000 mg | Freq: Once | INTRAVENOUS | Status: AC
  Administered 2023-06-03: 40 mg via INTRAVENOUS
  Filled 2023-06-03: qty 10

## 2023-06-03 NOTE — ED Notes (Signed)
 Pt called out requesting pain medication and stating no one has been in to see her. Pt provided update on delay and made aware that provider and assigned staff were in a procedure. Pt provided comfort measures and heat packs for pain. EMT-P notified.

## 2023-06-03 NOTE — ED Provider Notes (Signed)
  Provider Note MRN:  [id number]  Arrival date & time: 06/04/23    ED Course and Medical Decision Making  Assumed care of patient at sign-out or upon transfer.  History of bowel anastomosis after removal of tumor.  This was done at [facility] in May 2024.  Here with abdominal discomfort, nausea vomiting with symptoms controlled here in the emergency department.  Concern for possible early bowel obstruction on CT.  Will consult hospitalist for observation admission.  Procedures  Final Clinical Impressions(s) / ED Diagnoses     ICD-10-CM   1. Epigastric pain  R10.13     2. Ileus [facility])  K56.7       ED Discharge Orders     None       Discharge Instructions   None     Elmer Sow. Pilar Plate, MD [facility] [email or web address]    Sabas Sous, MD 06/04/23 [phone]

## 2023-06-03 NOTE — ED Notes (Signed)
 Messaged provider asking for pain medication due to request from Pt asking for something for pain.

## 2023-06-03 NOTE — ED Provider Notes (Addendum)
 [place] EMERGENCY DEPARTMENT AT [facility] Provider Note   CSN: [id number] Arrival date & time: 06/03/23  1732     History  Chief Complaint  Patient presents with   Abdominal Pain    Shari Berger is a 48 y.o. female.  HPI Reports that she has been in a lot of pain in her epigastrium.  She reports that just started today.  She reports it is a intense aching pain.  It radiates into her back and around her sides.  She reports it is worse with trying to eat.  She did not take any medications today because she was afraid that it would make things worse.  She reports she had 1 episode where she vomited up some water after she drank it.  She has not had any recurrent or forceful vomiting.  No diarrhea or constipation.  No urinary frequency, no fever.  She denies frequent or previous typical pain such as this.  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  She had postoperative complication with wound dehiscence.  She reports after that healed she has done well.  She has not dealt with chronic or recurrent pain.  Pain today was of new onset for her.  Reports for several weeks she has been getting a pain in the back of her upper left leg.  It radiates somewhat from her buttock to the hamstring area.  She reports it feels intermittently like a muscle cramp.  It is not continuous.  She does not have any weakness or numbness of the leg.  No known injury.  No swelling.    Home Medications Prior to Admission medications   Medication Sig Start Date End Date Taking? Authorizing Provider  acetaminophen (TYLENOL) 500 MG tablet Take 2 tablets (1,000 mg total) by mouth every 6 (six) hours. 12/26/19   Anyanwu, Jethro Bastos, MD  benzonatate (TESSALON) 100 MG capsule Take 1 capsule (100 mg total) by mouth every 8 (eight) hours. 05/03/23   Marita Kansas, PA-C  dicyclomine (BENTYL) 20 MG tablet Take 1 tablet (20 mg total) by mouth 4 (four) times daily -  before  meals and at bedtime. 06/07/22   Smoot, Shawn Route, PA-C  doxycycline (VIBRAMYCIN) 100 MG capsule Take 1 capsule (100 mg total) by mouth 2 (two) times daily. 05/03/23   Marita Kansas, PA-C  ibuprofen (ADVIL) 800 MG tablet Take 1 tablet (800 mg total) by mouth every 8 (eight) hours as needed (pain). 01/17/23   Zenia Resides, MD  omeprazole (PRILOSEC) 40 MG capsule TAKE 1 CAPSULE(40 MG) BY MOUTH DAILY 30 MINUTES BEFORE BREAKFAST 03/27/23   Nandigam, Eleonore Chiquito, MD  senna-docusate (SENOKOT-S) 8.6-50 MG tablet Take 1 tablet by mouth daily. 07/02/22   Rexford Maus, DO      Allergies    Oxycodone    Review of Systems   Review of Systems  Physical Exam Updated Vital Signs BP 126/75   Pulse 90   Temp 99 F (37.2 C) (Oral)   Resp 20   Ht 5\' 5"  (1.651 m)   Wt 104.3 kg   LMP 12/18/2019 Comment: partial  SpO2 99%   BMI 38.27 kg/m  Physical Exam Constitutional:      Comments: Alert nontoxic no acute distress.  HENT:     Head: Normocephalic and atraumatic.     Mouth/Throat:     Pharynx: Oropharynx is clear.  Eyes:     Extraocular Movements: Extraocular movements intact.  Conjunctiva/sclera: Conjunctivae normal.  Cardiovascular:     Rate and Rhythm: Normal rate and regular rhythm.  Pulmonary:     Effort: Pulmonary effort is normal.     Breath sounds: Normal breath sounds.  Abdominal:     Comments: Patient has a well-healed midline vertical surgical scar.  Abdominal exam was nontender.  She does not endorse tenderness in the surgical area or epigastrium.  She has pain in the epigastrium but it is not particular reproducible.  Musculoskeletal:     Comments: Bilateral lower extremities are symmetric.  No calf pain.  Calves are soft and pliable.  Distal pulses are 2+ and symmetric.  Feet are warm and dry.  Skin:    General: Skin is warm and dry.  Neurological:     General: No focal deficit present.     Mental Status: She is oriented to person, place, and time.     Motor: No  weakness.     Coordination: Coordination normal.     Comments: Patient is up and ambulatory coordinated gait.  No weakness or incoordination.  Mental status clear.  Psychiatric:        Mood and Affect: Mood normal.     ED Results / Procedures / Treatments   Labs (all labs ordered are listed, but only abnormal results are displayed) Labs Reviewed  COMPREHENSIVE METABOLIC PANEL - Abnormal; Notable for the following components:      Result Value   Glucose, Bld 107 (*)    AST 5 (*)    All other components within normal limits  CBC - Abnormal; Notable for the following components:   WBC 10.9 (*)    All other components within normal limits  URINALYSIS, ROUTINE W REFLEX MICROSCOPIC - Abnormal; Notable for the following components:   Ketones, ur 15 (*)    Protein, ur TRACE (*)    All other components within normal limits  LIPASE, BLOOD    EKG None  Radiology CT ABDOMEN PELVIS W CONTRAST Result Date: 06/03/2023 CLINICAL DATA:  Abdominal pain and vomiting. EXAM: CT ABDOMEN AND PELVIS WITH CONTRAST TECHNIQUE: Multidetector CT imaging of the abdomen and pelvis was performed using the standard protocol following bolus administration of intravenous contrast. RADIATION DOSE REDUCTION: This exam was performed according to the departmental dose-optimization program which includes automated exposure control, adjustment of the mA and/or kV according to patient size and/or use of iterative reconstruction technique. CONTRAST:  OMNIPAQUE IOHEXOL 300 MG/ML  SOLN COMPARISON:  July 02, 2022 FINDINGS: Lower chest: No acute abnormality. Hepatobiliary: A stable subcentimeter hepatic cyst versus hemangioma is seen within the posteromedial aspect of the right lobe of the liver (axial CT image 17, CT series 2). No gallstones, gallbladder wall thickening, or biliary dilatation. Pancreas: Unremarkable. No pancreatic ductal dilatation or surrounding inflammatory changes. Spleen: Normal in size without focal  abnormality. Adrenals/Urinary Tract: Adrenal glands are unremarkable. Kidneys are normal, without renal calculi, focal lesion, or hydronephrosis. Bladder is unremarkable. Stomach/Bowel: There is a small hiatal hernia. Appendix appears normal. Surgically anastomosed bowel is seen within the mid right abdomen, with a short segment of dilated small bowel (3.6 cm in diameter) leading up to the level of anastomosis (axial CT image 50, CT series 2/coronal re-formatted image 74, CT series 5) no evidence of bowel wall thickening or inflammatory changes. Vascular/Lymphatic: No significant vascular findings are present. No enlarged abdominal or pelvic lymph nodes. Reproductive: Status post hysterectomy. No adnexal masses. Other: A surgical scar seen along the midline of the lower abdominal  wall. No abdominopelvic ascites. Musculoskeletal: No acute or significant osseous findings. IMPRESSION: 1. Surgically anastomosed bowel within the mid right abdomen, with a short segment of dilated small bowel leading up to the level of anastomosis. This may represent a focal ileus versus early small bowel obstruction. 2. Small hiatal hernia. 3. Stable subcentimeter hepatic cyst versus hemangioma. Electronically Signed   By: Aram Candela M.D.   On: 06/03/2023 23:19    Procedures Procedures    Medications Ordered in ED Medications  lactated ringers bolus 1,000 mL (has no administration in time range)  lactated ringers infusion (has no administration in time range)  HYDROmorphone (DILAUDID) injection 0.5 mg (has no administration in time range)  pantoprazole (PROTONIX) injection 40 mg (40 mg Intravenous Given 06/03/23 2206)  HYDROmorphone (DILAUDID) injection 0.5 mg (0.5 mg Intravenous Given 06/03/23 2202)  iohexol (OMNIPAQUE) 300 MG/ML solution 100 mL (100 mLs Intravenous Contrast Given 06/03/23 2301)    ED Course/ Medical Decision Making/ A&P                                 Medical Decision Making Amount and/or  Complexity of Data Reviewed Labs: ordered. Radiology: ordered.  Risk Prescription drug management. Decision regarding hospitalization.   Presents as outlined with abdominal pain.  Acute onset today.  Radiates through to the back.  Patient does have history of prior surgical excision of fibromatous mass with spindle cell biopsy last year.  Abdominal exam is without guarding or reproducible tenderness.  Given patient's prior history and acute onset of pain however we will proceed with CT scan.  Patient also has history of GERD and takes as needed PPI but has not taken it recently.  Will treat with combination of Protonix and Dilaudid for pain control.  At this time, patient is leg pain sounds most consistent with sciatica.  The lower extremity exam is normal.  No soft tissue abnormality.  No vascular or neurologic dysfunction.  Patient is not currently having significant pain.  Will proceed with the CT and determine if any additional imaging is needed.  CT abdomen pelvis interpreted by radiology.  Area at anastomosis of small bowel from prior surgery of ileus versus early small bowel obstruction.  Patient is still comfortable.  Pain control with Dilaudid half milligram and Protonix.  Patient is not having any vomiting.  At this time with history of surgical anastomosis, acute onset of pain and CT findings, will plan for admission for n.p.o. status IV hydration for observation for possible early small bowel obstruction.   Consult: Reviewed with Dr. Joneen Roach for admission [facility] hospitalist.     Final Clinical Impression(s) / ED Diagnoses Final diagnoses:  Epigastric pain  Ileus [facility])    Rx / DC Orders ED Discharge Orders     None         Arby Barrette, MD 06/03/23 1610    Arby Barrette, MD 06/03/23 2355

## 2023-06-03 NOTE — ED Notes (Signed)
 In Pt's room looking for IV site. Attempted 1 IV without success. Ask for another RN to take a look and est IV with blood draw.

## 2023-06-03 NOTE — ED Triage Notes (Signed)
 Pt to ED POV reporting upper abd pain starting today with one episode of vomiting and intermittent nausea. No diarrhea or fevers. Tenderness upon palpation, pain worse when eating.   Pain also reported radiating down left leg x 3 weeks.

## 2023-06-03 NOTE — ED Notes (Signed)
Attempted blood draw in triage without success.

## 2023-06-04 ENCOUNTER — Observation Stay (HOSPITAL_COMMUNITY)

## 2023-06-04 DIAGNOSIS — K567 Ileus, unspecified: Secondary | ICD-10-CM | POA: Diagnosis not present

## 2023-06-04 DIAGNOSIS — R1013 Epigastric pain: Secondary | ICD-10-CM | POA: Diagnosis present

## 2023-06-04 DIAGNOSIS — Z9889 Other specified postprocedural states: Secondary | ICD-10-CM | POA: Diagnosis not present

## 2023-06-04 MED ORDER — SODIUM CHLORIDE 0.9% FLUSH
3.0000 mL | Freq: Two times a day (BID) | INTRAVENOUS | Status: DC
  Administered 2023-06-04: 3 mL via INTRAVENOUS

## 2023-06-04 MED ORDER — ONDANSETRON HCL 4 MG/2ML IJ SOLN
4.0000 mg | Freq: Four times a day (QID) | INTRAMUSCULAR | Status: DC | PRN
  Administered 2023-06-04: 4 mg via INTRAVENOUS
  Filled 2023-06-04: qty 2

## 2023-06-04 MED ORDER — ONDANSETRON HCL 4 MG PO TABS: 4.0000 mg | ORAL_TABLET | Freq: Three times a day (TID) | ORAL | 0 refills | Status: AC | PRN

## 2023-06-04 MED ORDER — ACETAMINOPHEN 650 MG RE SUPP: 650.0000 mg | Freq: Four times a day (QID) | RECTAL | Status: DC | PRN

## 2023-06-04 MED ORDER — ACETAMINOPHEN 325 MG PO TABS
650.0000 mg | ORAL_TABLET | Freq: Four times a day (QID) | ORAL | Status: DC | PRN
  Administered 2023-06-04: 650 mg via ORAL
  Filled 2023-06-04: qty 2

## 2023-06-04 NOTE — Progress Notes (Signed)
 Patient being discharged via private transportation, discharge packet reviewed at bedside with patient, IV access removed.

## 2023-06-04 NOTE — Discharge Summary (Signed)
 Physician Discharge Summary   Patient: Shari Berger MRN: [id number] DOB: November 03, 1975  Admit date:     06/03/2023  Discharge date: 06/04/23  Discharge Physician: Rickey Barbara   PCP: Grayce Sessions, NP   Recommendations at discharge:    Follow up with PCP in 1-2 weeks  Discharge Diagnoses: Principal Problem:   Ileus (HCC)  Resolved Problems:   * No resolved hospital problems. [facility] Course: 48 y.o. 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  Presented to the ED ER with abrupt onset of abdominal pain with 1 episode of vomiting and intermittent nausea.  No diarrhea or fevers.  She was tender upon palpation and she reported pain was worse after eating.  CT of the abdomen and pelvis demonstrated a short segment of dilated small bowel leading up to the level of the surgical anastomosis.  Concerns were for either focal ileus versus early small bowel obstruction.  Labs were unremarkable otherwise.  [facility] was [facility]   Since [facility] patient's status has significantly improved.  Although she is not passing any bowel movements (patient stated prior to arrival had had 4-5 bowel movements) her abdomen remains soft with active bowel sounds and she has had no further emesis or nausea.  She is reporting hunger.  Assessment and Plan: Abdominal pain secondary to ileus versus evolving small bowel obstruction Patient has had multiple abdominal surgeries and is at risk for bowel obstruction Symptoms resolved prior to transfer to [facility] from ED Follow-up two-view abdominal x-ray today reviewed, nonspecific bowel gas pattern Pt successfully advanced diet to soft without any issues and was eager to go home Pt has appointment with surgeon who performed last years's surgery in 5/25. Recommend keeping that appt Will  plan d/c today with close outpt f/u       Consultants:  Procedures performed:   Disposition: Home Diet recommendation:  Regular diet DISCHARGE MEDICATION: Allergies as of 06/04/2023       Reactions   Oxycodone Itching        Medication List     TAKE these medications    acetaminophen 500 MG tablet Commonly known as: TYLENOL Take 2 tablets (1,000 mg total) by mouth every 6 (six) hours. What changed:  when to take this reasons to take this   ibuprofen 800 MG tablet Commonly known as: ADVIL Take 1 tablet (800 mg total) by mouth every 8 (eight) hours as needed (pain).   omeprazole 40 MG capsule Commonly known as: PRILOSEC TAKE 1 CAPSULE(40 MG) BY MOUTH DAILY 30 MINUTES BEFORE BREAKFAST   ondansetron 4 MG tablet Commonly known as: Zofran Take 1 tablet (4 mg total) by mouth every 8 (eight) hours as needed for nausea or vomiting.        Follow-up Information     Grayce Sessions, NP Follow up in 2 week(s).   Specialty: Internal Medicine Why: Hospital follow up Contact information: [address] [place] [address] [phone]                Discharge Exam: Ceasar Mons Weights   06/03/23 1748  Weight: 104.3 kg   General exam: Awake, laying in bed, in nad Respiratory system: Normal respiratory effort, no wheezing Cardiovascular system: regular rate, s1, s2 Gastrointestinal system: Soft, nondistended, positive BS Central nervous system: CN2-12 grossly intact, strength intact Extremities: Perfused, no clubbing Skin: Normal  skin turgor, no notable skin lesions seen Psychiatry: Mood normal // no visual hallucinations   Condition at discharge: fair  The results of significant diagnostics from this hospitalization (including imaging, microbiology, ancillary and laboratory) are listed below for reference.   Imaging Studies: DG Abd 2 Views Result Date: 06/04/2023 CLINICAL DATA:  Ileus. EXAM: ABDOMEN - 2 VIEW COMPARISON:  CT 06/03/2023 and older  FINDINGS: Gas is seen in nondilated loops of small large bowel. No obstruction. No definite free air seen beneath the diaphragm on the upright view. Air-fluid level along the stomach. There is presumed contrast in the urinary bladder from prior CT. IMPRESSION: Nonspecific bowel gas pattern. Electronically Signed   By: Karen Kays M.D.   On: 06/04/2023 13:52   CT ABDOMEN PELVIS W CONTRAST Result Date: 06/03/2023 CLINICAL DATA:  Abdominal pain and vomiting. EXAM: CT ABDOMEN AND PELVIS WITH CONTRAST TECHNIQUE: Multidetector CT imaging of the abdomen and pelvis was performed using the standard protocol following bolus administration of intravenous contrast. RADIATION DOSE REDUCTION: This exam was performed according to the departmental dose-optimization program which includes automated exposure control, adjustment of the mA and/or kV according to patient size and/or use of iterative reconstruction technique. CONTRAST:  OMNIPAQUE IOHEXOL 300 MG/ML  SOLN COMPARISON:  July 02, 2022 FINDINGS: Lower chest: No acute abnormality. Hepatobiliary: A stable subcentimeter hepatic cyst versus hemangioma is seen within the posteromedial aspect of the right lobe of the liver (axial CT image 17, CT series 2). No gallstones, gallbladder wall thickening, or biliary dilatation. Pancreas: Unremarkable. No pancreatic ductal dilatation or surrounding inflammatory changes. Spleen: Normal in size without focal abnormality. Adrenals/Urinary Tract: Adrenal glands are unremarkable. Kidneys are normal, without renal calculi, focal lesion, or hydronephrosis. Bladder is unremarkable. Stomach/Bowel: There is a small hiatal hernia. Appendix appears normal. Surgically anastomosed bowel is seen within the mid right abdomen, with a short segment of dilated small bowel (3.6 cm in diameter) leading up to the level of anastomosis (axial CT image 50, CT series 2/coronal re-formatted image 74, CT series 5) no evidence of bowel wall thickening or  inflammatory changes. Vascular/Lymphatic: No significant vascular findings are present. No enlarged abdominal or pelvic lymph nodes. Reproductive: Status post hysterectomy. No adnexal masses. Other: A surgical scar seen along the midline of the lower abdominal wall. No abdominopelvic ascites. Musculoskeletal: No acute or significant osseous findings. IMPRESSION: 1. Surgically anastomosed bowel within the mid right abdomen, with a short segment of dilated small bowel leading up to the level of anastomosis. This may represent a focal ileus versus early small bowel obstruction. 2. Small hiatal hernia. 3. Stable subcentimeter hepatic cyst versus hemangioma. Electronically Signed   By: Aram Candela M.D.   On: 06/03/2023 23:19    Microbiology: Results for orders placed or performed during the hospital encounter of 05/02/23  Resp panel by RT-PCR (RSV, Flu A&B, Covid) Anterior Nasal Swab     Status: Abnormal   Collection Time: 05/02/23  5:09 PM   Specimen: Anterior Nasal Swab  Result Value Ref Range Status   SARS Coronavirus 2 by RT PCR NEGATIVE NEGATIVE Final    Comment: (NOTE) SARS-CoV-2 target nucleic acids are NOT DETECTED.  The SARS-CoV-2 RNA is generally detectable in upper respiratory specimens during the acute phase of infection. The lowest concentration of SARS-CoV-2 viral copies this assay can detect is 138 copies/mL. A negative result does not preclude SARS-Cov-2 infection and should not be used as the sole basis for treatment or other patient management decisions. A negative result  may occur with  improper specimen collection/handling, submission of specimen other than nasopharyngeal swab, presence of viral mutation(s) within the areas targeted by this assay, and inadequate number of viral copies(<138 copies/mL). A negative result must be combined with clinical observations, patient history, and epidemiological information. The expected result is Negative.  Fact Sheet for Patients:   BloggerCourse.com  Fact Sheet for Healthcare Providers:  SeriousBroker.it  This test is no t yet approved or cleared by the [place] FDA and  has been authorized for detection and/or diagnosis of SARS-CoV-2 by FDA under an Emergency Use Authorization (EUA). This EUA will remain  in effect (meaning this test can be used) for the duration of the COVID-19 declaration under Section 564(b)(1) of the Act, 21 U.S.C.section 360bbb-3(b)(1), unless the authorization is terminated  or revoked sooner.       Influenza A by PCR POSITIVE (A) NEGATIVE Final   Influenza B by PCR NEGATIVE NEGATIVE Final    Comment: (NOTE) The Xpert Xpress SARS-CoV-2/FLU/RSV plus assay is intended as an aid in the diagnosis of influenza from Nasopharyngeal swab specimens and should not be used as a sole basis for treatment. Nasal washings and aspirates are unacceptable for Xpert Xpress SARS-CoV-2/FLU/RSV testing.  Fact Sheet for Patients: BloggerCourse.com  Fact Sheet for Healthcare Providers: SeriousBroker.it  This test is not yet approved or cleared by the [place] FDA and has been authorized for detection and/or diagnosis of SARS-CoV-2 by FDA under an Emergency Use Authorization (EUA). This EUA will remain in effect (meaning this test can be used) for the duration of the COVID-19 declaration under Section 564(b)(1) of the Act, 21 U.S.C. section 360bbb-3(b)(1), unless the authorization is terminated or revoked.     Resp Syncytial Virus by PCR NEGATIVE NEGATIVE Final    Comment: (NOTE) Fact Sheet for Patients: BloggerCourse.com  Fact Sheet for Healthcare Providers: SeriousBroker.it  This test is not yet approved or cleared by the [place] FDA and has been authorized for detection and/or diagnosis of SARS-CoV-2 by FDA under an Emergency Use  Authorization (EUA). This EUA will remain in effect (meaning this test can be used) for the duration of the COVID-19 declaration under Section 564(b)(1) of the Act, 21 U.S.C. section 360bbb-3(b)(1), unless the authorization is terminated or revoked.  Performed at [facility], [address], [place], [place] [address]     Labs: CBC: Recent Labs  Lab 06/03/23 1750  WBC 10.9*  HGB 13.4  HCT 40.3  MCV 83.8  PLT 310   Basic Metabolic Panel: Recent Labs  Lab 06/03/23 1750  NA 135  K 3.6  CL 105  CO2 25  GLUCOSE 107*  BUN 9  CREATININE 0.60  CALCIUM 8.9   Liver Function Tests: Recent Labs  Lab 06/03/23 1750  AST 5*  ALT 11  ALKPHOS 62  BILITOT 0.7  PROT 7.0  ALBUMIN 3.6   CBG: No results for input(s): "GLUCAP" in the last 168 hours.  Discharge time spent: less than 30 minutes.  Signed: Rickey Barbara, MD [facility] 06/04/2023

## 2023-06-04 NOTE — H&P (Signed)
 History and Physical    Patient: Shari Berger WUJ:[id number] DOB: [birth year] DOA: 06/03/2023 DOS: the patient was seen and examined on 06/04/2023 PCP: Grayce Sessions, NP  Patient coming from: Home  Medical readiness/disposition: Patient has quickly improved since arrival and anticipate may be ready for discharge by the afternoon of 06/04/2023.  She will return back home.  Chief Complaint:  Chief Complaint  Patient presents with   Abdominal Pain   HPI: Shari Berger is a 48 y.o. 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  Presented to the ED ER with abrupt onset of abdominal pain with 1 episode of vomiting and intermittent nausea.  No diarrhea or fevers.  She was tender upon palpation and she reported pain was worse after eating.  CT of the abdomen and pelvis demonstrated a short segment of dilated small bowel leading up to the level of the surgical anastomosis.  Concerns were for either focal ileus versus early small bowel obstruction.  Labs were unremarkable otherwise.  [facility]  Since [facility] patient's status has significantly improved.  Although she is not passing any bowel movements (patient stated prior to arrival had had 4-5 bowel movements) her abdomen remains soft with active bowel sounds and she has had no further emesis or nausea.  She is reporting hunger.  Review of Systems: As mentioned in the history of present illness. All other systems reviewed and are negative. Past Medical History:  Diagnosis Date   Acid reflux    ocassional   Anemia    Cluster headache    Fibroid    uterine fibroids   Migraines    Past Surgical History:  Procedure Laterality Date   ABDOMINAL HYSTERECTOMY N/A 12/24/2019   Procedure: HYSTERECTOMY ABDOMINAL;  Surgeon: Tereso Newcomer, MD;  Location:  [facility] OR;  Service: Gynecology;  Laterality: N/A;   LAPAROSCOPY N/A 12/24/2019   Procedure: diagnostic laparoscopy;  Surgeon: Tereso Newcomer, MD;  Location: [facility] OR;  Service: Gynecology;  Laterality: N/A;   WISDOM TOOTH EXTRACTION     Social History:  reports that she has never smoked. She has never used smokeless tobacco. She reports current alcohol use. She reports that she does not use drugs.  Allergies  Allergen Reactions   Oxycodone Itching    Family History  Problem Relation Age of Onset   Depression Mother    Heart disease Father    Diabetes Father    Hypertension Father    Colon cancer Neg Hx    Esophageal cancer Neg Hx     Prior to Admission medications   Medication Sig Start Date End Date Taking? Authorizing Provider  acetaminophen (TYLENOL) 500 MG tablet Take 2 tablets (1,000 mg total) by mouth every 6 (six) hours. 12/26/19   Anyanwu, Jethro Bastos, MD  benzonatate (TESSALON) 100 MG capsule Take 1 capsule (100 mg total) by mouth every 8 (eight) hours. 05/03/23   Marita Kansas, PA-C  dicyclomine (BENTYL) 20 MG tablet Take 1 tablet (20 mg total) by mouth 4 (four) times daily -  before meals and at bedtime. 06/07/22   Smoot, Shawn Route, PA-C  doxycycline (VIBRAMYCIN) 100 MG capsule Take 1 capsule (100 mg total) by mouth 2 (two) times daily. 05/03/23   Marita Kansas, PA-C  ibuprofen (ADVIL) 800 MG tablet Take 1 tablet (800 mg total) by mouth every 8 (eight) hours  as needed (pain). 01/17/23   Zenia Resides, MD  omeprazole (PRILOSEC) 40 MG capsule TAKE 1 CAPSULE(40 MG) BY MOUTH DAILY 30 MINUTES BEFORE BREAKFAST 03/27/23   Nandigam, Eleonore Chiquito, MD  senna-docusate (SENOKOT-S) 8.6-50 MG tablet Take 1 tablet by mouth daily. 07/02/22   Rexford Maus, DO    Physical Exam: Vitals:   06/04/23 0400 06/04/23 0420 06/04/23 0511 06/04/23 1020  BP: 113/60  136/65 118/66  Pulse: 91  75 81  Resp: 16  17   Temp:  98.7 F (37.1 C) 98 F (36.7 C) 98.3 F (36.8 C)  TempSrc:  Oral Oral   SpO2: 98%   100% 98%  Weight:      Height:       Constitutional: NAD, calm, comfortable Respiratory: clear to auscultation bilaterally, no wheezing, no crackles. Normal respiratory effort. No accessory muscle use.  Cardiovascular: Regular rate and rhythm, no murmurs / rubs / gallops. No extremity edema. 2+ pedal pulses. Abdomen: no tenderness, no masses palpated. No hepatosplenomegaly. Bowel sounds positive.  Musculoskeletal: no clubbing / cyanosis. No joint deformity upper and lower extremities. Good ROM, no contractures. Normal muscle tone.  Skin: no rashes, lesions, ulcers. No induration Neurologic: CN 2-12 grossly intact. Sensation intact, DTR normal. Strength 5/5 x all 4 extremities.  Psychiatric: Normal judgment and insight. Alert and oriented x 3. Normal mood.     Data Reviewed:  Sodium 135, potassium 3.6, CO2 25, glucose 107, BUN 9, creatinine 0.6, LFTs are normal  WBCs 10,900, hemoglobin 13.4, platelets 310,000  Urinalysis unremarkable  Assessment and Plan: Abdominal pain secondary to ileus versus evolving small bowel obstruction Patient has had multiple abdominal surgeries and is at risk for bowel obstruction Patient appears to be improving since initial presentation Follow-up two-view abdominal x-ray today seems to show improvement although has yet to be interpreted by the radiologist Likely will be able to begin full liquid diet today and if patient tolerates can potentially discharge home later Would remain on a soft/low residue diet for the next 1 to 2 weeks will need to follow-up with PCP at that time    Advance Care Planning:   Code Status: Full Code   VTE prophylaxis: Early ambulation therefore low risk for VTE  Consults: None  Family Communication: Family at bedside  Severity of Illness: The appropriate patient status for this patient is OBSERVATION. Observation status is judged to be reasonable and necessary in order to provide the required intensity of service to  ensure the patient's safety. The patient's presenting symptoms, physical exam findings, and initial radiographic and laboratory data in the context of their medical condition is felt to place them at decreased risk for further clinical deterioration. Furthermore, it is anticipated that the patient will be medically stable for discharge from the hospital within 2 midnights of admission.   Author: Junious Silk, NP 06/04/2023 10:34 AM  For on call review [email or web address].

## 2023-06-04 NOTE — ED Notes (Signed)
 Report called over to [facility] and given to [facility]

## 2023-06-05 ENCOUNTER — Telehealth: Payer: Self-pay

## 2023-06-05 NOTE — Transitions of Care (Post Inpatient/ED Visit) (Signed)
 06/05/2023  Name: Shari Berger MRN: [id number] DOB: Dec 10, 1975  Today's TOC FU Call Status: Today's TOC FU Call Status:: Successful TOC FU Call Completed Unsuccessful Call (1st Attempt) Date: 06/05/23 [facility] FU Call Complete Date: 06/05/23 Patient's Name and Date of Birth confirmed.  Transition Care Management Follow-up Telephone Call Date of Discharge: 06/04/23 Discharge Facility: [facility]) Type of Discharge: Inpatient Admission Primary Inpatient Discharge Diagnosis:: ileus How have you been since you were released from the hospital?: Better Any questions or concerns?: Yes Patient Questions/Concerns:: She explained that she has tolerated the soft diet but is anxious about advancing it. She stated that the hospital staff instructed her to stay on the soft diet for  "awhile."  She also explained that she is nervous about a bowel obstruction due to her multiple surgeries. I instructed her to contact her surgeon about these concerns.  She has an appointment with the surgeon on 527/2025.   I told her that she can call and request to be seen sooner. She said she understood but the hospital provider said they were going to reach out to the surgeon about scheduling an earlier appointment for her. Patient Questions/Concerns Addressed: Other: (noted above. Patient instructed to call the surgeon)  Items Reviewed: Did you receive and understand the discharge instructions provided?: Yes Medications obtained,verified, and reconciled?: Yes (Medications Reviewed) Any new allergies since your discharge?: No Dietary orders reviewed?: Yes Type of Diet Ordered:: soft diet. Do you have support at home?: Yes Name of Support/Comfort Primary Source: She stated she has help at home but did not specify who,  Medications Reviewed Today: Medications Reviewed Today     Reviewed by Robyne Peers, RN (Case Manager) on 06/05/23 at 1306  Med List Status: <None>   Medication Order Taking? Sig Documenting  Provider Last Dose Status Informant  acetaminophen (TYLENOL) 500 MG tablet [id number] No Take 2 tablets (1,000 mg total) by mouth every 6 (six) hours.  Patient taking differently: Take 1,000 mg by mouth every 6 (six) hours as needed for moderate pain (pain score 4-6).   Tereso Newcomer, MD 07/07/2022 Active Self, Pharmacy Records           Med Note [facility] Chical, SUSAN A   Sun Jun 04, 2023  1:35 PM)    ibuprofen (ADVIL) 800 MG tablet [id number]  Take 1 tablet (800 mg total) by mouth every 8 (eight) hours as needed (pain). Zenia Resides, MD  Active Self, Pharmacy Records  omeprazole [facility]) 40 MG capsule [id number] No TAKE 1 CAPSULE(40 MG) BY MOUTH DAILY 30 MINUTES BEFORE BREAKFAST Napoleon Form, MD 05/30/2023 Active Self, Pharmacy Records           Med Note [facility] Sherlon Handing, SUSAN A   Sun Jun 04, 2023  1:39 PM) PRN  ondansetron (ZOFRAN) 4 MG tablet [id number]  Take 1 tablet (4 mg total) by mouth every 8 (eight) hours as needed for nausea or vomiting. Jerald Kief, MD  Active             Home Care and Equipment/Supplies: Were Home Health Services Ordered?: No Any new equipment or medical supplies ordered?: No  Functional Questionnaire: Do you need assistance with bathing/showering or dressing?: No Do you need assistance with meal preparation?: No Do you need assistance with eating?: No Do you have difficulty maintaining continence: No Do you need assistance with getting out of bed/getting out of a chair/moving?: No Do you have difficulty managing or taking your medications?: No  Follow up  appointments reviewed: PCP Follow-up appointment confirmed?: Yes Date of PCP follow-up appointment?: 06/22/23 Follow-up Provider: Gwinda Passe, NP Specialist Hospital Follow-up appointment confirmed?: Yes Date of Specialist follow-up appointment?: 08/25/23 Follow-Up Specialty Provider:: surgeon Do you need transportation to your follow-up appointment?: No Do you understand  care options if your condition(s) worsen?: Yes-patient verbalized understanding    SIGNATURE Robyne Peers, RN

## 2023-06-05 NOTE — Transitions of Care (Post Inpatient/ED Visit) (Signed)
   06/05/2023  Name: Shari Berger MRN: [id number] DOB: [birth year]  Today's TOC FU Call Status: Today's TOC FU Call Status:: Unsuccessful Call (1st Attempt) Unsuccessful Call (1st Attempt) Date: 06/05/23  Attempted to reach the patient regarding the most recent Inpatient/ED visit.  Follow Up Plan: Additional outreach attempts will be made to reach the patient to complete the Transitions of Care (Post Inpatient/ED visit) call.   Signature  Robyne Peers, RN

## 2023-06-22 ENCOUNTER — Ambulatory Visit (INDEPENDENT_AMBULATORY_CARE_PROVIDER_SITE_OTHER): Admitting: Primary Care

## 2023-06-22 ENCOUNTER — Encounter (INDEPENDENT_AMBULATORY_CARE_PROVIDER_SITE_OTHER): Admitting: Primary Care

## 2023-06-22 NOTE — Progress Notes (Signed)
 Erroneous

## 2023-11-24 ENCOUNTER — Encounter (HOSPITAL_BASED_OUTPATIENT_CLINIC_OR_DEPARTMENT_OTHER): Payer: Self-pay | Admitting: Emergency Medicine

## 2023-11-24 ENCOUNTER — Emergency Department (HOSPITAL_BASED_OUTPATIENT_CLINIC_OR_DEPARTMENT_OTHER): Payer: Self-pay

## 2023-11-24 ENCOUNTER — Emergency Department (HOSPITAL_BASED_OUTPATIENT_CLINIC_OR_DEPARTMENT_OTHER)
Admission: EM | Admit: 2023-11-24 | Discharge: 2023-11-24 | Disposition: A | Discharge status: Home / Self Care | Payer: Self-pay | Attending: Emergency Medicine | Admitting: Emergency Medicine

## 2023-11-24 ENCOUNTER — Other Ambulatory Visit: Payer: Self-pay

## 2023-11-24 DIAGNOSIS — X58XXXA Exposure to other specified factors, initial encounter: Secondary | ICD-10-CM | POA: Insufficient documentation

## 2023-11-24 DIAGNOSIS — S76312A Strain of muscle, fascia and tendon of the posterior muscle group at thigh level, left thigh, initial encounter: Secondary | ICD-10-CM | POA: Insufficient documentation

## 2023-11-24 LAB — CBC WITH DIFFERENTIAL/PLATELET
Abs Immature Granulocytes: 0.02 K/uL (ref 0.00–0.07)
Basophils Absolute: 0.1 K/uL (ref 0.0–0.1)
Basophils Relative: 1 %
Eosinophils Absolute: 0.2 K/uL (ref 0.0–0.5)
Eosinophils Relative: 3 %
HCT: 41.6 % (ref 36.0–46.0)
Hemoglobin: 13.5 g/dL (ref 12.0–15.0)
Immature Granulocytes: 0 %
Lymphocytes Relative: 31 %
Lymphs Abs: 2.2 K/uL (ref 0.7–4.0)
MCH: 28.7 pg (ref 26.0–34.0)
MCHC: 32.5 g/dL (ref 30.0–36.0)
MCV: 88.5 fL (ref 80.0–100.0)
Monocytes Absolute: 0.6 K/uL (ref 0.1–1.0)
Monocytes Relative: 8 %
Neutro Abs: 4.1 K/uL (ref 1.7–7.7)
Neutrophils Relative %: 57 %
Platelets: 243 K/uL (ref 150–400)
RBC: 4.7 MIL/uL (ref 3.87–5.11)
RDW: 13.6 % (ref 11.5–15.5)
WBC: 7.2 K/uL (ref 4.0–10.5)
nRBC: 0 % (ref 0.0–0.2)

## 2023-11-24 LAB — BASIC METABOLIC PANEL WITH GFR
Anion gap: 12 (ref 5–15)
BUN: 12 mg/dL (ref 6–20)
CO2: 17 mmol/L — ABNORMAL LOW (ref 22–32)
Calcium: 8.8 mg/dL — ABNORMAL LOW (ref 8.9–10.3)
Chloride: 108 mmol/L (ref 98–111)
Creatinine, Ser: 0.81 mg/dL (ref 0.44–1.00)
GFR, Estimated: >60 mL/min (ref >60)
Glucose, Bld: 99 mg/dL (ref 70–99)
Potassium: 4.1 mmol/L (ref 3.5–5.1)
Sodium: 137 mmol/L (ref 135–145)

## 2023-11-24 MED ORDER — METHYLPREDNISOLONE 4 MG PO TBPK: ORAL_TABLET | ORAL | 0 refills | Status: AC

## 2023-11-24 NOTE — ED Triage Notes (Signed)
 Pt caox4, ambulatory, NAD c/o pain in L posterior thigh x1 mo. Denies any trauma or injury. Denies PMH DVT/PE.

## 2023-11-24 NOTE — ED Notes (Signed)
Assumed care of patient for discharge.

## 2023-11-24 NOTE — ED Provider Notes (Signed)
 [place] EMERGENCY DEPARTMENT AT [facility] Provider Note   CSN: [id number] Arrival date & time: 11/24/23  9175     Patient presents with: Leg Pain   Shari Berger is a 48 y.o. female.   Patient here with pain in her left hamstring for the last couple weeks on and off.  Denies any trauma or injury.  No history of blood clots.  No major medical problems.  Certain movements make it worse.  Certain positions make it better.  She denies any fever or chills.  No nausea vomiting diarrhea.  No chest pain or shortness of breath.  No recent surgery or travel.  The history is provided by the patient.       Prior to Admission medications   Medication Sig Start Date End Date Taking? Authorizing Provider  methylPREDNISolone  (MEDROL  DOSEPAK) 4 MG TBPK tablet Follow package insert 11/24/23  Yes Quay Simkin, DO  acetaminophen  (TYLENOL ) 500 MG tablet Take 2 tablets (1,000 mg total) by mouth every 6 (six) hours. Patient taking differently: Take 1,000 mg by mouth every 6 (six) hours as needed for moderate pain (pain score 4-6). 12/26/19   Anyanwu, Ugonna A, MD  ibuprofen  (ADVIL ) 800 MG tablet Take 1 tablet (800 mg total) by mouth every 8 (eight) hours as needed (pain). 01/17/23   Vonna Sharlet POUR, MD  omeprazole  (PRILOSEC) 40 MG capsule TAKE 1 CAPSULE(40 MG) BY MOUTH DAILY 30 MINUTES BEFORE BREAKFAST 03/27/23   Nandigam, Kavitha V, MD  ondansetron  (ZOFRAN ) 4 MG tablet Take 1 tablet (4 mg total) by mouth every 8 (eight) hours as needed for nausea or vomiting. 06/04/23   Cindy Garnette POUR, MD    Allergies: Oxycodone     Review of Systems  Updated Vital Signs BP 118/74   Pulse 88   Temp 97.7 F (36.5 C) (Oral)   Resp 16   Ht 5' 5 (1.651 m)   Wt 99.8 kg   LMP 12/18/2019 Comment: partial  SpO2 100%   BMI 36.61 kg/m   Physical Exam Vitals and nursing note reviewed.  Constitutional:      General: She is not in acute distress.    Appearance: She is well-developed.  HENT:      Head: Normocephalic and atraumatic.     Nose: Nose normal.     Mouth/Throat:     Mouth: Mucous membranes are moist.  Eyes:     Extraocular Movements: Extraocular movements intact.     Conjunctiva/sclera: Conjunctivae normal.     Pupils: Pupils are equal, round, and reactive to light.  Cardiovascular:     Rate and Rhythm: Normal rate and regular rhythm.     Pulses: Normal pulses.     Heart sounds: Normal heart sounds. No murmur heard. Pulmonary:     Effort: Pulmonary effort is normal. No respiratory distress.     Breath sounds: Normal breath sounds.  Abdominal:     Palpations: Abdomen is soft.     Tenderness: There is no abdominal tenderness.  Musculoskeletal:        General: Tenderness present. No swelling.     Cervical back: Neck supple.     Comments: Tenderness in the posterior left thigh in the hamstring area mostly in the mid thigh region no bony tenderness no redness swelling or shingles appearing rash  Skin:    General: Skin is warm and dry.     Capillary Refill: Capillary refill takes less than 2 seconds.  Neurological:     General: No focal deficit present.  Mental Status: She is alert.     Sensory: No sensory deficit.     Motor: No weakness.     Comments: 5+ out of 5 strength, normal sensation  Psychiatric:        Mood and Affect: Mood normal.     (all labs ordered are listed, but only abnormal results are displayed) Labs Reviewed  BASIC METABOLIC PANEL WITH GFR - Abnormal; Notable for the following components:      Result Value   CO2 17 (*)    Calcium 8.8 (*)    All other components within normal limits  CBC WITH DIFFERENTIAL/PLATELET    EKG: None  Radiology: US  Venous Img Lower  Left (DVT Study) Result Date: 11/24/2023 CLINICAL DATA:  Left lower extremity pain. EXAM: LEFT LOWER EXTREMITY VENOUS DOPPLER ULTRASOUND TECHNIQUE: Gray-scale sonography with graded compression, as well as color Doppler and duplex ultrasound were performed to evaluate the lower  extremity deep venous systems from the level of the common femoral vein and including the common femoral, femoral, profunda femoral, popliteal and calf veins including the posterior tibial, peroneal and gastrocnemius veins when visible. The superficial great saphenous vein was also interrogated. Spectral Doppler was utilized to evaluate flow at rest and with distal augmentation maneuvers in the common femoral, femoral and popliteal veins. COMPARISON:  None Available. FINDINGS: Contralateral Common Femoral Vein: Respiratory phasicity is normal and symmetric with the symptomatic side. No evidence of thrombus. Normal compressibility. Common Femoral Vein: No evidence of thrombus. Normal compressibility, respiratory phasicity and response to augmentation. Saphenofemoral Junction: No evidence of thrombus. Normal compressibility and flow on color Doppler imaging. Profunda Femoral Vein: No evidence of thrombus. Normal compressibility and flow on color Doppler imaging. Femoral Vein: No evidence of thrombus. Normal compressibility, respiratory phasicity and response to augmentation. Popliteal Vein: No evidence of thrombus. Normal compressibility, respiratory phasicity and response to augmentation. Calf Veins: No evidence of thrombus. Normal compressibility and flow on color Doppler imaging. Superficial Great Saphenous Vein: No evidence of thrombus. Normal compressibility. Venous Reflux:  None. Other Findings: No evidence of superficial thrombophlebitis or abnormal fluid collection. IMPRESSION: No evidence of left lower extremity deep venous thrombosis. Electronically Signed   By: Marcey Moan M.D.   On: 11/24/2023 10:25     Procedures   Medications Ordered in the ED - No data to display                                  Medical Decision Making Amount and/or Complexity of Data Reviewed Labs: ordered.  Risk Prescription drug management.   Shari Berger is here with left /hamstring pain.  Her pain is mostly  in her left hamstring.  She has got strong pulses on exam.  There is no evidence of a cellulitis or infectious process shingles or fluctuance.  Differential diagnosis likely muscle strain muscle spasm.  Will evaluate for DVT check electrolytes.  But I do suspect the soft tissue/hamstring type process.  May be sciatica.  Pain does go into the buttocks at times.  Was not having any back pain.  No loss of bowel or bladder.  No weakness numbness tingling.  Normal strength and sensation on exam.  Overall lab work is unremarkable for my review and interpretation.  DVT study negative.  Overall I suspect hamstring strain.  Will start Medrol  Dosepak follow-up with primary care.  Discharged in good condition.  I have no concern for arterial process.  Neurovascular neuromuscular intact on exam otherwise.  This chart was dictated using voice recognition software.  Despite best efforts to proofread,  errors can occur which can change the documentation meaning.      Final diagnoses:  Strain of left hamstring, initial encounter    ED Discharge Orders          Ordered    methylPREDNISolone  (MEDROL  DOSEPAK) 4 MG TBPK tablet        11/24/23 1045               Jakaleb Payer, DO 11/24/23 1046

## 2024-01-18 ENCOUNTER — Encounter (INDEPENDENT_AMBULATORY_CARE_PROVIDER_SITE_OTHER): Admitting: Primary Care

## 2024-02-08 ENCOUNTER — Encounter (HOSPITAL_BASED_OUTPATIENT_CLINIC_OR_DEPARTMENT_OTHER): Payer: Self-pay | Admitting: Emergency Medicine

## 2024-02-08 ENCOUNTER — Other Ambulatory Visit: Payer: Self-pay

## 2024-02-08 DIAGNOSIS — M545 Low back pain, unspecified: Secondary | ICD-10-CM | POA: Insufficient documentation

## 2024-02-08 NOTE — ED Triage Notes (Signed)
 Pt to ED from home c/o lower bilateral back pain and hip pain x2 days.  Denies urinary changes, denies injuries, denies distal neurological changes.  States hx of pinched nerve but that was only on one side and this feels different.

## 2024-02-09 ENCOUNTER — Emergency Department (HOSPITAL_BASED_OUTPATIENT_CLINIC_OR_DEPARTMENT_OTHER)
Admission: EM | Admit: 2024-02-09 | Discharge: 2024-02-09 | Disposition: A | Discharge status: Home / Self Care | Payer: Self-pay | Attending: Emergency Medicine | Admitting: Emergency Medicine

## 2024-02-09 DIAGNOSIS — M549 Dorsalgia, unspecified: Secondary | ICD-10-CM

## 2024-02-09 MED ORDER — NAPROXEN 250 MG PO TABS
500.0000 mg | ORAL_TABLET | Freq: Once | ORAL | Status: AC
  Administered 2024-02-09: 500 mg via ORAL
  Filled 2024-02-09: qty 2

## 2024-02-09 MED ORDER — NAPROXEN 500 MG PO TABS: 500.0000 mg | ORAL_TABLET | Freq: Two times a day (BID) | ORAL | 0 refills | Status: AC

## 2024-02-09 MED ORDER — LIDOCAINE 5 % EX PTCH
1.0000 | MEDICATED_PATCH | CUTANEOUS | Status: DC
  Administered 2024-02-09: 1 via TRANSDERMAL
  Filled 2024-02-09: qty 1

## 2024-02-09 MED ORDER — CYCLOBENZAPRINE HCL 10 MG PO TABS: 10.0000 mg | ORAL_TABLET | Freq: Two times a day (BID) | ORAL | 0 refills | Status: AC | PRN

## 2024-02-09 MED ORDER — HYDROCODONE-ACETAMINOPHEN 5-325 MG PO TABS
2.0000 | ORAL_TABLET | Freq: Once | ORAL | Status: AC
  Administered 2024-02-09: 2 via ORAL
  Filled 2024-02-09: qty 2

## 2024-02-09 MED ORDER — CYCLOBENZAPRINE HCL 5 MG PO TABS
5.0000 mg | ORAL_TABLET | Freq: Once | ORAL | Status: AC
  Administered 2024-02-09: 5 mg via ORAL
  Filled 2024-02-09: qty 1

## 2024-02-11 NOTE — ED Provider Notes (Signed)
 [place] EMERGENCY DEPARTMENT AT [facility] Provider Note   CSN: [id number] Arrival date & time: 02/08/24  2052     Patient presents with: Back Pain   Shari Berger is a 48 y.o. female.   48 year old female presents ER today secondary to back pain.  She states she has had lower back pain for last couple days does not radiate.  Did not have any urinary changes, injuries, IV drug use, diabetes, cancer or neurologic changes.  She has history of pinched nerve but that was more sharp and radiating that whereas this is a more achy pain.  Tried some over-the-counter medications and had some relief with IcyHot patch but short-lived.  Had some relief with Aleve  but only took it once and did not think she could take it again.   Back Pain      Prior to Admission medications   Medication Sig Start Date End Date Taking? Authorizing Provider  cyclobenzaprine (FLEXERIL) 10 MG tablet Take 1 tablet (10 mg total) by mouth 2 (two) times daily as needed for muscle spasms. 02/09/24  Yes Crewe Heathman, Selinda, MD  naproxen  (NAPROSYN ) 500 MG tablet Take 1 tablet (500 mg total) by mouth 2 (two) times daily. 02/09/24  Yes Carolanne Mercier, Selinda, MD  acetaminophen  (TYLENOL ) 500 MG tablet Take 2 tablets (1,000 mg total) by mouth every 6 (six) hours. Patient taking differently: Take 1,000 mg by mouth every 6 (six) hours as needed for moderate pain (pain score 4-6). 12/26/19   Anyanwu, Ugonna A, MD  ibuprofen  (ADVIL ) 800 MG tablet Take 1 tablet (800 mg total) by mouth every 8 (eight) hours as needed (pain). 01/17/23   Vonna Sharlet POUR, MD  methylPREDNISolone  (MEDROL  DOSEPAK) 4 MG TBPK tablet Follow package insert 11/24/23   Curatolo, Adam, DO  omeprazole  (PRILOSEC) 40 MG capsule TAKE 1 CAPSULE(40 MG) BY MOUTH DAILY 30 MINUTES BEFORE BREAKFAST 03/27/23   Nandigam, Kavitha V, MD  ondansetron  (ZOFRAN ) 4 MG tablet Take 1 tablet (4 mg total) by mouth every 8 (eight) hours as needed for nausea or vomiting. 06/04/23   Cindy Garnette POUR, MD    Allergies: Oxycodone     Review of Systems  Musculoskeletal:  Positive for back pain.    Updated Vital Signs BP (!) 120/58   Pulse 92   Temp 97.8 F (36.6 C)   Resp 18   Ht 5' 6 (1.676 m)   Wt 103.9 kg   LMP 12/18/2019 Comment: partial  SpO2 98%   BMI 36.96 kg/m   Physical Exam Vitals and nursing note reviewed.  Constitutional:      Appearance: She is well-developed.  HENT:     Head: Normocephalic and atraumatic.  Cardiovascular:     Rate and Rhythm: Normal rate and regular rhythm.  Pulmonary:     Effort: No respiratory distress.     Breath sounds: No stridor.  Abdominal:     General: There is no distension.  Musculoskeletal:     Cervical back: Normal range of motion.     Comments: Bilateral lower lumbar tenderness.  No midline tenderness.  No pelvis tenderness.  Neurological:     Mental Status: She is alert.     (all labs ordered are listed, but only abnormal results are displayed) Labs Reviewed - No data to display  EKG: None  Radiology: No results found.   Procedures   Medications Ordered in the ED  HYDROcodone-acetaminophen  (NORCO/VICODIN) 5-325 MG per tablet 2 tablet (2 tablets Oral Given 02/09/24 0104)  naproxen  (NAPROSYN ) tablet  500 mg (500 mg Oral Given 02/09/24 0104)  cyclobenzaprine (FLEXERIL) tablet 5 mg (5 mg Oral Given 02/09/24 0104)                                    Medical Decision Making Risk Prescription drug management.   Suspect muscular causes for pain.  Improved.  But here in the ER with oral medications.  Will prescribe anti-inflammatories and muscle relaxers at home.  PCP follow-up if no improvement in 3 days.  Return to the ER if any red flags as discussed earlier.   Final diagnoses:  Acute bilateral back pain, unspecified back location    ED Discharge Orders          Ordered    cyclobenzaprine (FLEXERIL) 10 MG tablet  2 times daily PRN        02/09/24 0232    naproxen  (NAPROSYN ) 500 MG tablet   2 times daily        02/09/24 0232               Deara Bober, Selinda, MD 02/11/24 [id number]

## 2024-03-19 ENCOUNTER — Ambulatory Visit: Payer: Self-pay | Admitting: Gastroenterology

## 2024-03-19 NOTE — Progress Notes (Deleted)
 "  Shari Berger [id number] October 27, 1975   Chief Complaint:  Referring Provider: Celestia Rosaline SQUIBB, NP Primary GI MD:   HPI: Shari Berger is a 48 y.o. female with past medical history of acid reflux, anemia, abdominal mass s/p resection 2024, hysterectomy who presents today for a complaint of *** .    Patient last seen in office 06/28/2022 by Dr. Eda for abdominal pain, nausea, vomiting, and history of H. pylori gastritis (treated and eradicated).  Had required recent ED evaluation.  Plan at that time was to continue omeprazole  40 mg twice daily, add Bentyl  20 mg 4 times daily as needed, get a CT abdomen and pelvis to further evaluate, as well as an upper endoscopy and screening colonoscopy.  Patient ultimately went to the ED for her pain and had CT at that time which showed a mass in the abdomen, oncology referral was made.  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  Patient followed with oncology and surgery for this and has not been seen in our office since.  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  At last visit with surgery there was no evidence of recurrent disease and patient advised to return to clinic in 1 year with repeat imaging.  She had an admission to the hospital 06/15/2023 to 06/04/2023 for ileus.  Presented to the ER with abrupt onset abdominal pain and 1 episode of vomiting with intermittent nausea.  CT showed development of short segment of dilated small bowel leading up to the level of the surgical anastomosis concerning for either focal ileus versus early small bowel obstruction.  Ultimately was able to successfully advance diet without any issues and was discharged with recommendation to follow-up with surgery.  Med refill of omeprazole ?  Discussed the use of AI scribe software for clinical note transcription with the  patient, who gave verbal consent to proceed.  History of Present Illness       Previous GI Procedures/Imaging   CT A/P 06/03/2023 IMPRESSION: 1. Surgically anastomosed bowel within the mid right abdomen, with a short segment of dilated small bowel leading up to the level of anastomosis. This may represent a focal ileus versus early small bowel obstruction. 2. Small hiatal hernia. 3. Stable subcentimeter hepatic cyst versus hemangioma.  EGD 10/29/2021 - Normal esophagus. Biopsied.  - Erythematous mucosa in the gastric body. Biopsied.  - Small hiatal hernia.  - Normal examined duodenum. Biopsied.  - The examination was otherwise normal. Path: 1. Surgical [P], duodenal bulb, 2nd portion of duodenum, and distal duodenum FRAGMENTS OF NORMAL DUODENAL MUCOSA. THERE ARE NO DIAGNOSTIC FEATURES OF CELIAC DISEASE.  2. 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 NEGATIVE FOR DYSPLASIA AND MALIGNANCY. THE FOLLOWING IMMUNOSTAIN IS PERFORMED WITH APPROPRIATE CONTROL: H. PYLORI: POSITIVE.  3. Surgical [P], mid esophagus and distal esophagus FRAGMENTS OF NORMAL ESOPHAGEAL MUCOSA. THERE ARE NO DIAGNOSTIC FEATURES OF BARRETT'S ESOPHAGUS AND EOSINOPHILIC ESOPHAGITIS.  Past Medical History:  Diagnosis Date   Acid reflux    ocassional   Anemia    Cluster headache    Fibroid    uterine fibroids   Migraines     Past Surgical History:  Procedure Laterality Date   ABDOMINAL HYSTERECTOMY N/A 12/24/2019   Procedure: HYSTERECTOMY ABDOMINAL;  Surgeon: Herchel Gloris LABOR, MD;  Location: [facility] OR;  Service: Gynecology;  Laterality: N/A;   ABDOMINAL SURGERY  07/2022   Mass removed   LAPAROSCOPY N/A 12/24/2019   Procedure: diagnostic laparoscopy;  Surgeon: Herchel Gloris LABOR, MD;  Location: [facility] OR;  Service: Gynecology;  Laterality: N/A;   WISDOM TOOTH EXTRACTION      Current  Outpatient Medications  Medication Sig Dispense Refill   acetaminophen  (TYLENOL ) 500 MG tablet Take 2 tablets (1,000 mg total) by mouth every 6 (six) hours. (Patient taking differently: Take 1,000 mg by mouth every 6 (six) hours as needed for moderate pain (pain score 4-6).) 30 tablet 2   cyclobenzaprine  (FLEXERIL ) 10 MG tablet Take 1 tablet (10 mg total) by mouth 2 (two) times daily as needed for muscle spasms. 20 tablet 0   ibuprofen  (ADVIL ) 800 MG tablet Take 1 tablet (800 mg total) by mouth every 8 (eight) hours as needed (pain). 21 tablet 0   methylPREDNISolone  (MEDROL  DOSEPAK) 4 MG TBPK tablet Follow package insert 21 each 0   naproxen  (NAPROSYN ) 500 MG tablet Take 1 tablet (500 mg total) by mouth 2 (two) times daily. 30 tablet 0   omeprazole  (PRILOSEC) 40 MG capsule TAKE 1 CAPSULE(40 MG) BY MOUTH DAILY 30 MINUTES BEFORE BREAKFAST 90 capsule 0   ondansetron  (ZOFRAN ) 4 MG tablet Take 1 tablet (4 mg total) by mouth every 8 (eight) hours as needed for nausea or vomiting. 20 tablet 0   No current facility-administered medications for this visit.    Allergies as of 03/19/2024 - Review Complete 02/08/2024  Allergen Reaction Noted   Oxycodone  Itching 07/08/2022    Family History  Problem Relation Age of Onset   Depression Mother    Heart disease Father    Diabetes Father    Hypertension Father    Colon cancer Neg Hx    Esophageal cancer Neg Hx     Social History[1]   Review of Systems:    Constitutional: No weight loss, fever, chills, weakness or fatigue Eyes: No change in vision Ears, Nose, Throat:  No change in hearing or congestion Skin: No rash or itching Cardiovascular: No chest pain, chest pressure or palpitations   Respiratory: No SOB or cough Gastrointestinal: See HPI and otherwise negative Genitourinary: No dysuria or change in urinary frequency Neurological: No headache, dizziness or syncope Musculoskeletal: No new muscle or joint pain Hematologic: No bleeding or  bruising    Physical Exam:  Vital signs: LMP 12/18/2019 Comment: partial  Constitutional: NAD, Well developed, Well nourished, alert and cooperative Head:  Normocephalic and atraumatic.  Eyes: No scleral icterus. Conjunctiva pink. Mouth: No oral lesions. Respiratory: Respirations even and unlabored. Lungs clear to auscultation bilaterally.  No wheezes, crackles, or rhonchi.  Cardiovascular:  Regular rate and rhythm. No murmurs. No peripheral edema. Gastrointestinal:  Soft, nondistended, nontender. No rebound or guarding. Normal bowel sounds. No appreciable masses or hepatomegaly. Rectal:  Not performed.  Neurologic:  Alert and oriented x4;  grossly normal neurologically.  Skin:   Dry and intact without significant lesions or rashes. Psychiatric: Oriented to person, place and time. Demonstrates good judgement and reason without abnormal affect or behaviors.  Assessment/Plan:   Assessment & Plan     Needs screening colonoscopy   Camie Furbish, PA-C [facility] 03/19/2024, 8:24 AM  Patient Care Team: Celestia Rosaline SQUIBB, NP as PCP - General (Internal Medicine)      [1]  Social History Tobacco Use   Smoking status: Never   Smokeless tobacco: Never  Vaping Use  Vaping status: Former  Substance Use Topics   Alcohol use: Yes    Comment: ocassional   Drug use: No   "
